# Patient Record
Sex: Female | Born: 1945 | Race: Black or African American | Hispanic: No | Marital: Married | Smoking: Never smoker
Health system: Southern US, Community
[De-identification: ages and names within clinical notes are randomized; demographics above are authoritative.]

## PROBLEM LIST (undated history)

## (undated) DIAGNOSIS — E7800 Pure hypercholesterolemia, unspecified: Secondary | ICD-10-CM

## (undated) DIAGNOSIS — N183 Chronic kidney disease, stage 3 unspecified: Secondary | ICD-10-CM

## (undated) DIAGNOSIS — I10 Essential (primary) hypertension: Secondary | ICD-10-CM

## (undated) DIAGNOSIS — E119 Type 2 diabetes mellitus without complications: Secondary | ICD-10-CM

## (undated) DIAGNOSIS — E049 Nontoxic goiter, unspecified: Secondary | ICD-10-CM

## (undated) DIAGNOSIS — D649 Anemia, unspecified: Secondary | ICD-10-CM

## (undated) HISTORY — PX: ABDOMINAL HYSTERECTOMY: SHX81

## (undated) HISTORY — PX: REPLACEMENT TOTAL KNEE: SUR1224

---

## 1999-05-31 ENCOUNTER — Other Ambulatory Visit: Admission: RE | Admit: 1999-05-31 | Discharge: 1999-06-22 | Payer: Self-pay | Admitting: Obstetrics and Gynecology

## 2002-09-05 ENCOUNTER — Emergency Department (HOSPITAL_COMMUNITY): Admission: EM | Admit: 2002-09-05 | Discharge: 2002-09-06 | Payer: Self-pay | Admitting: Emergency Medicine

## 2002-09-05 ENCOUNTER — Encounter: Payer: Self-pay | Admitting: Emergency Medicine

## 2002-09-08 ENCOUNTER — Emergency Department (HOSPITAL_COMMUNITY): Admission: EM | Admit: 2002-09-08 | Discharge: 2002-09-08 | Payer: Self-pay | Admitting: Emergency Medicine

## 2003-08-24 ENCOUNTER — Encounter: Admission: RE | Admit: 2003-08-24 | Discharge: 2003-09-10 | Payer: Self-pay | Admitting: Orthopedic Surgery

## 2003-09-11 ENCOUNTER — Encounter: Admission: RE | Admit: 2003-09-11 | Discharge: 2003-12-10 | Payer: Self-pay | Admitting: Orthopedic Surgery

## 2005-11-29 ENCOUNTER — Ambulatory Visit: Payer: Self-pay

## 2005-11-29 ENCOUNTER — Ambulatory Visit: Payer: Self-pay | Admitting: Cardiology

## 2005-11-30 ENCOUNTER — Ambulatory Visit: Payer: Self-pay | Admitting: Cardiology

## 2005-11-30 ENCOUNTER — Ambulatory Visit (HOSPITAL_COMMUNITY): Admission: RE | Admit: 2005-11-30 | Discharge: 2005-11-30 | Payer: Self-pay | Admitting: Cardiology

## 2005-12-03 ENCOUNTER — Ambulatory Visit: Payer: Self-pay

## 2005-12-03 ENCOUNTER — Encounter: Payer: Self-pay | Admitting: Cardiology

## 2005-12-17 ENCOUNTER — Ambulatory Visit: Payer: Self-pay | Admitting: Cardiology

## 2006-04-14 ENCOUNTER — Emergency Department (HOSPITAL_COMMUNITY): Admission: EM | Admit: 2006-04-14 | Discharge: 2006-04-14 | Payer: Self-pay | Admitting: Emergency Medicine

## 2006-05-07 ENCOUNTER — Ambulatory Visit: Payer: Self-pay | Admitting: Gastroenterology

## 2006-05-14 ENCOUNTER — Ambulatory Visit: Payer: Self-pay | Admitting: Gastroenterology

## 2006-10-23 ENCOUNTER — Emergency Department (HOSPITAL_COMMUNITY): Admission: EM | Admit: 2006-10-23 | Discharge: 2006-10-23 | Payer: Self-pay | Admitting: *Deleted

## 2006-12-16 ENCOUNTER — Encounter: Admission: RE | Admit: 2006-12-16 | Discharge: 2007-03-16 | Payer: Self-pay | Admitting: Family Medicine

## 2007-03-31 ENCOUNTER — Encounter: Admission: RE | Admit: 2007-03-31 | Discharge: 2007-03-31 | Payer: Self-pay | Admitting: Family Medicine

## 2007-11-18 ENCOUNTER — Emergency Department (HOSPITAL_COMMUNITY): Admission: EM | Admit: 2007-11-18 | Discharge: 2007-11-18 | Payer: Self-pay | Admitting: Emergency Medicine

## 2008-01-26 ENCOUNTER — Ambulatory Visit: Payer: Self-pay | Admitting: Cardiology

## 2008-08-24 ENCOUNTER — Emergency Department (HOSPITAL_COMMUNITY): Admission: EM | Admit: 2008-08-24 | Discharge: 2008-08-24 | Payer: Self-pay | Admitting: Emergency Medicine

## 2011-01-23 NOTE — Assessment & Plan Note (Signed)
[facility]                            CARDIOLOGY OFFICE NOTE   NAME:[name], [name] [name]                       MRN:          [id number]  DATE:01/26/2008                            DOB:          [birth year]    PRIMARY CARE PHYSICIAN:  Dr. Thayer Ohm Guest at [facility].   REASON FOR VISIT:  Recent episode of chest pain.   HISTORY OF PRESENT ILLNESS:  I last saw [name] [name] back in 2007.  [name] [name] has  a history of mild coronary atherosclerosis documented at cardiac  catheterization in March 2007 with a normal left ventricular ejection  fraction of 55-60% and grade 1 diastolic dysfunction.  Medical therapy  was recommended at that time.  [name] [name] is referred back given an episode of  chest pain [name] [name] experienced a few weeks ago.  [name] [name] tells me that [name] [name] drank  an energy drink and subsequently began to develop discomfort in [name] [name]  arms, [name] [name] neck and [name] [name] chest.  [name] [name] has had these symptoms sporadically  at baseline, but had a more intense episode on this particular occasion.  [name] [name] reports being back to baseline since then.  I have a copy of an  electrocardiogram from [name] [name] visit with [name] [name] primary care Mickell Birdwell that  shows nonspecific ST changes.  Repeat tracing today shows sinus rhythm  with nonspecific ST-T wave changes.  In addition to this, [name] [name] states [name] [name]  has also had a fairly dry but nagging cough at least since March.  There  is a possibility that it may have been related to allergies/pollen  although [name] [name] is also concerned about [name] [name] medications.  [name] [name] states [name] [name]  actually came off of all [name] [name] medicines for a week and that [name] [name] symptoms  went away.  In reviewing [name] [name] list it seems the most likely culprit could  be lisinopril, although [name] [name] has been on this for least a year.  I asked  [name] [name] to discuss possibly switching this to an angiotensin receptor  blocker to see if [name] [name] symptoms abate.   ALLERGIES:  No known drug allergies.   MEDICATIONS:  1.  Hydrochlorothiazide 25 mg p.o. daily.  2. Aspirin 325 mg p.o. daily.  3. Multivitamin one p.o. daily.  4. Vytorin 10/20 mg p.o. at bedtime.  5. Glipizide 10 mg p.o. daily.  6. Metformin 5 mg p.o. b.i.d.  7. Lisinopril 5 mg p.o. daily.  8. Claritin.  9. Proair p.r.n.   REVIEW OF SYSTEMS:  As outlined above.  No reproducible exertional chest  pain or limiting dyspnea.  No palpitations or syncope.  Otherwise  negative.   PHYSICAL EXAMINATION:  VITAL SIGNS:  Blood pressure 122/86, heart rate  is 82, weight is 205 pounds.  GENERAL:  An overweight woman in no acute distress.  NECK:  No elevated jugular venous pressure, no loud bruits.  LUNGS:  Clear.  CARDIAC:  Regular rate and rhythm.  No murmur, rub, or gallops.  EXTREMITIES:  No pitting edema.   IMPRESSION/RECOMMENDATIONS:  1. Episode of chest pain, arm pain and neck pain following an energy  drink.  [name] [name] does have these symptoms sporadically at baseline,      although not specifically related to exertion and on a baseline      history of non-obstructive coronary atherosclerosis at      catheterization in 2007.  Electrocardiogram is nonspecific.  I      recommended that [name] [name] not use any energy drinks and that [name] [name]      continue a strategy of medical therapy for [name] [name] coronary      atherosclerosis.  I doubt that additional testing is required at      this point unless [name] [name] manifests progressive symptoms.  [name] [name] will      continue to follow up with Dr. Perrin Maltese.  2. Regarding [name] [name] cough. I think it would be reasonable to at least      consider switching [name] [name] lisinopril to an angiotensin receptor      blocker or other antihypertensive to see if [name] [name] symptoms improve.      Of [name] [name] medications, this would be the most likely culprit although      I realize that [name] [name] has been on this for at least a year, predating      [name] [name] present cough.  I asked [name] [name] to discuss this with Dr. Perrin Maltese and      not simply stop the medication without appropriate  follow-up.     Jonelle Sidle, MD  Electronically Signed    SGM/MedQ  DD: 01/26/2008  DT: 01/26/2008  Job #: [id number]   cc:   Jonita Albee, M.D.

## 2011-01-26 NOTE — Cardiovascular Report (Signed)
NAMEMARELI, ANTUNES                ACCOUNT NO.:  [id number]   MEDICAL RECORD NO.:  [id number]          PATIENT TYPE:  OIB   LOCATION:  2853                         FACILITY:  [facility]   PHYSICIAN:  Arturo Morton. Riley Kill, M.D. [facility] OF BIRTH:  1945-09-16   DATE OF PROCEDURE:  11/30/2005  DATE OF DISCHARGE:                              CARDIAC CATHETERIZATION   INDICATIONS:  Ms. Alkhatib is a 65 year old who presents with some discomfort.  It has been somewhat atypical.  She has also had palpitations. She had an  adenosine Myoview in the office and developed some heart block; in addition,  there was a mild defect in the anterior segment.  Because of multiple risk  factors, Dr. Diona Browner felt that coronary arteriography was indicated.  She  was brought back to the catheterization laboratory for further evaluation.   PROCEDURES:  1.  Left heart catheterization.  2.  Selective coronary arteriography.  3.  Selective left ventriculography.   DESCRIPTION OF PROCEDURE:  The patient was brought to the catheterization  laboratory, prepped and draped in the usual fashion.  Through an anterior  puncture, the right femoral artery was easily entered. A 5-French sheath was  placed.  Views of the left and right coronary arteries were obtained in  multiple angiographic projections.  We performed a right coronary  arteriography before and after intracoronary nitroglycerin administration.  Central aortic and left ventricular pressures were measured with a pigtail  catheter.  Ventriculography was performed in the RAO projection.  All  catheters were then subsequently removed.  She was taken to the holding area  in satisfactory clinical condition for sheath removal and direct hemostasis.   There were no complications with the procedure. Her husband was not  available at the time as he gone home to take care of three grandchildren.   HEMODYNAMIC DATA:  1.  Central aortic pressure 140/90, mean 115.  2.  Left  ventricular pressure 140/4.  3.  No gradient on pullback across the aortic valve.   ANGIOGRAPHIC DATA:  1.  Ventriculography was done in the RAO projection.  Overall systolic      function was well-preserved.  No segmental abnormalities or contraction      were identified.  2.  The right coronary artery demonstrates some very mild ostial narrowing.      This probably is about 30-40%.  This was not dramatically changed by      intracoronary nitroglycerin.  There was not damping with complete      engagement with the 5-French catheter.  There was a posterior descending      artery and posterolateral branch both of which are free of critical      disease.  3.  The left main coronary artery is very large-caliber vessel that appears      free of critical disease.  4.  Left anterior descending artery courses to the apex.  The LAD has an      upward takeoff and then an abrupt turn and there is perhaps 20%      narrowing in this location.  This does not  appear to be high-grade. The      mid and distal LAD appear relatively smooth with only minor luminal      irregularities. There may be 20-30% narrowing in the midportion of the      vessel after the diagonal. Again none of this appears to be high-grade.  5.  There was a small intermediate vessel that bifurcates and is free of      critical disease.  6.  There is a circumflex provides two major marginal branches.  The      circumflex appears without significant focal narrowing.  There is some      tortuosity distally.   CONCLUSION:  1.  Well-preserved left ventricular function.  2.  Mild proximal irregularity of the left anterior descending artery.  3.  30-40% ostial narrowing of the right coronary artery without high-grade      focal stenosis.   DISPOSITION:  We will arrange for a follow-up echocardiography with Dr.  Diona Browner.  Follow-up office visit next week will be recommended.  Dr.  Diona Browner has reviewed the films, spoken with the  patient and will direct  further evaluation.      Arturo Morton. Riley Kill, M.D. [facility]  Electronically Signed     TDS/MEDQ  D:  11/30/2005  T:  12/02/2005  Job:  [id number]   cc:   Jonita Albee, M.D.  Fax: [phone]   Jonelle Sidle, M.D. [facility]  [address]  [place]  [place] [address]   CV Laboratory   Patient's medical record

## 2011-03-26 ENCOUNTER — Encounter (HOSPITAL_COMMUNITY)
Admission: RE | Admit: 2011-03-26 | Discharge: 2011-03-26 | Disposition: A | Discharge status: Home / Self Care | Payer: Medicare Other | Source: Ambulatory Visit | Attending: Orthopedic Surgery | Admitting: Orthopedic Surgery

## 2011-03-26 ENCOUNTER — Other Ambulatory Visit (HOSPITAL_COMMUNITY): Payer: Self-pay | Admitting: Orthopedic Surgery

## 2011-03-26 ENCOUNTER — Ambulatory Visit (HOSPITAL_COMMUNITY)
Admission: RE | Admit: 2011-03-26 | Discharge: 2011-03-26 | Disposition: A | Discharge status: Home / Self Care | Payer: Medicare Other | Source: Ambulatory Visit | Attending: Orthopedic Surgery | Admitting: Orthopedic Surgery

## 2011-03-26 DIAGNOSIS — Z01818 Encounter for other preprocedural examination: Secondary | ICD-10-CM

## 2011-03-26 DIAGNOSIS — Z01812 Encounter for preprocedural laboratory examination: Secondary | ICD-10-CM | POA: Insufficient documentation

## 2011-03-26 LAB — DIFFERENTIAL
Basophils Absolute: 0 10*3/uL (ref 0.0–0.1)
Basophils Relative: 0 % (ref 0–1)
Eosinophils Absolute: 0.1 10*3/uL (ref 0.0–0.7)
Eosinophils Relative: 2 % (ref 0–5)
Lymphocytes Relative: 37 % (ref 12–46)
Lymphs Abs: 2.1 10*3/uL (ref 0.7–4.0)
Monocytes Absolute: 0.4 10*3/uL (ref 0.1–1.0)
Monocytes Relative: 7 % (ref 3–12)
Neutro Abs: 3.1 10*3/uL (ref 1.7–7.7)
Neutrophils Relative %: 54 % (ref 43–77)

## 2011-03-26 LAB — CBC
HCT: 39.2 % (ref 36.0–46.0)
Hemoglobin: 12.9 g/dL (ref 12.0–15.0)
MCH: 29.1 pg (ref 26.0–34.0)
MCHC: 32.9 g/dL (ref 30.0–36.0)
MCV: 88.5 fL (ref 78.0–100.0)
Platelets: 227 10*3/uL (ref 150–400)
RBC: 4.43 MIL/uL (ref 3.87–5.11)
RDW: 13.1 % (ref 11.5–15.5)
WBC: 5.7 10*3/uL (ref 4.0–10.5)

## 2011-03-26 LAB — URINALYSIS, ROUTINE W REFLEX MICROSCOPIC
Bilirubin Urine: NEGATIVE
Glucose, UA: NEGATIVE mg/dL
Ketones, ur: NEGATIVE mg/dL
Leukocytes, UA: NEGATIVE
Nitrite: NEGATIVE
Protein, ur: NEGATIVE mg/dL
Specific Gravity, Urine: 1.029 (ref 1.005–1.030)
Urobilinogen, UA: 0.2 mg/dL (ref 0.0–1.0)
pH: 5 (ref 5.0–8.0)

## 2011-03-26 LAB — BASIC METABOLIC PANEL
BUN: 21 mg/dL (ref 6–23)
CO2: 29 mEq/L (ref 19–32)
Calcium: 11.1 mg/dL — ABNORMAL HIGH (ref 8.4–10.5)
Chloride: 106 mEq/L (ref 96–112)
Creatinine, Ser: 0.92 mg/dL (ref 0.50–1.10)
GFR calc Af Amer: >60 mL/min (ref >60)
GFR calc non Af Amer: >60 mL/min (ref >60)
Glucose, Bld: 94 mg/dL (ref 70–99)
Potassium: 4.3 mEq/L (ref 3.5–5.1)
Sodium: 142 mEq/L (ref 135–145)

## 2011-03-26 LAB — URINE MICROSCOPIC-ADD ON

## 2011-03-26 LAB — ABO/RH: ABO/RH(D): O POS

## 2011-03-26 LAB — PROTIME-INR
INR: 0.91 (ref 0.00–1.49)
Prothrombin Time: 12.5 seconds (ref 11.6–15.2)

## 2011-03-26 LAB — SURGICAL PCR SCREEN
MRSA, PCR: NEGATIVE
Staphylococcus aureus: NEGATIVE

## 2011-03-26 LAB — TYPE AND SCREEN
ABO/RH(D): O POS
Antibody Screen: NEGATIVE

## 2011-03-26 LAB — APTT: aPTT: 27 seconds (ref 24–37)

## 2011-03-27 LAB — URINE CULTURE
Colony Count: 25000
Culture  Setup Time: 201207161648

## 2011-03-30 ENCOUNTER — Inpatient Hospital Stay (HOSPITAL_COMMUNITY): Payer: Medicare Other

## 2011-03-30 ENCOUNTER — Inpatient Hospital Stay (HOSPITAL_COMMUNITY)
Admission: RE | Admit: 2011-03-30 | Discharge: 2011-04-02 | DRG: 470 | Disposition: A | Discharge status: Home Health | Payer: Medicare Other | Source: Ambulatory Visit | Attending: Orthopedic Surgery | Admitting: Orthopedic Surgery

## 2011-03-30 DIAGNOSIS — K5900 Constipation, unspecified: Secondary | ICD-10-CM | POA: Diagnosis not present

## 2011-03-30 DIAGNOSIS — M1710 Unilateral primary osteoarthritis, unspecified knee: Principal | ICD-10-CM | POA: Diagnosis present

## 2011-03-30 DIAGNOSIS — E871 Hypo-osmolality and hyponatremia: Secondary | ICD-10-CM | POA: Diagnosis not present

## 2011-03-30 DIAGNOSIS — D62 Acute posthemorrhagic anemia: Secondary | ICD-10-CM | POA: Diagnosis not present

## 2011-03-30 DIAGNOSIS — I2510 Atherosclerotic heart disease of native coronary artery without angina pectoris: Secondary | ICD-10-CM | POA: Diagnosis present

## 2011-03-30 DIAGNOSIS — I10 Essential (primary) hypertension: Secondary | ICD-10-CM | POA: Diagnosis present

## 2011-03-30 DIAGNOSIS — E119 Type 2 diabetes mellitus without complications: Secondary | ICD-10-CM | POA: Diagnosis present

## 2011-03-30 DIAGNOSIS — Z01812 Encounter for preprocedural laboratory examination: Secondary | ICD-10-CM

## 2011-03-30 DIAGNOSIS — E876 Hypokalemia: Secondary | ICD-10-CM | POA: Diagnosis not present

## 2011-03-30 DIAGNOSIS — Z794 Long term (current) use of insulin: Secondary | ICD-10-CM

## 2011-03-30 DIAGNOSIS — Z79899 Other long term (current) drug therapy: Secondary | ICD-10-CM

## 2011-03-30 DIAGNOSIS — E669 Obesity, unspecified: Secondary | ICD-10-CM | POA: Diagnosis present

## 2011-03-30 DIAGNOSIS — Z01810 Encounter for preprocedural cardiovascular examination: Secondary | ICD-10-CM

## 2011-03-30 LAB — GLUCOSE, CAPILLARY
Glucose-Capillary: 91 mg/dL (ref 70–99)
Glucose-Capillary: 129 mg/dL — ABNORMAL HIGH (ref 70–99)
Glucose-Capillary: 176 mg/dL — ABNORMAL HIGH (ref 70–99)
Glucose-Capillary: 169 mg/dL — ABNORMAL HIGH (ref 70–99)
Glucose-Capillary: 138 mg/dL — ABNORMAL HIGH (ref 70–99)

## 2011-03-31 LAB — BASIC METABOLIC PANEL
BUN: 15 mg/dL (ref 6–23)
CO2: 26 mEq/L (ref 19–32)
Calcium: 9.7 mg/dL (ref 8.4–10.5)
Chloride: 102 mEq/L (ref 96–112)
Creatinine, Ser: 0.9 mg/dL (ref 0.50–1.10)
GFR calc Af Amer: >60 mL/min (ref >60)
GFR calc non Af Amer: >60 mL/min (ref >60)
Glucose, Bld: 187 mg/dL — ABNORMAL HIGH (ref 70–99)
Potassium: 3.8 mEq/L (ref 3.5–5.1)
Sodium: 136 mEq/L (ref 135–145)

## 2011-03-31 LAB — GLUCOSE, CAPILLARY
Glucose-Capillary: 205 mg/dL — ABNORMAL HIGH (ref 70–99)
Glucose-Capillary: 190 mg/dL — ABNORMAL HIGH (ref 70–99)
Glucose-Capillary: 191 mg/dL — ABNORMAL HIGH (ref 70–99)
Glucose-Capillary: 177 mg/dL — ABNORMAL HIGH (ref 70–99)

## 2011-03-31 LAB — CBC
HCT: 29.9 % — ABNORMAL LOW (ref 36.0–46.0)
Hemoglobin: 9.9 g/dL — ABNORMAL LOW (ref 12.0–15.0)
MCH: 29 pg (ref 26.0–34.0)
MCHC: 33.1 g/dL (ref 30.0–36.0)
MCV: 87.7 fL (ref 78.0–100.0)
Platelets: 191 10*3/uL (ref 150–400)
RBC: 3.41 MIL/uL — ABNORMAL LOW (ref 3.87–5.11)
RDW: 13.5 % (ref 11.5–15.5)
WBC: 7.1 10*3/uL (ref 4.0–10.5)

## 2011-04-01 LAB — CBC
HCT: 28 % — ABNORMAL LOW (ref 36.0–46.0)
Hemoglobin: 9.4 g/dL — ABNORMAL LOW (ref 12.0–15.0)
MCH: 28.9 pg (ref 26.0–34.0)
MCHC: 33.6 g/dL (ref 30.0–36.0)
MCV: 86.2 fL (ref 78.0–100.0)
Platelets: 178 10*3/uL (ref 150–400)
RBC: 3.25 MIL/uL — ABNORMAL LOW (ref 3.87–5.11)
RDW: 12.9 % (ref 11.5–15.5)
WBC: 8.6 10*3/uL (ref 4.0–10.5)

## 2011-04-01 LAB — BASIC METABOLIC PANEL
BUN: 12 mg/dL (ref 6–23)
CO2: 24 mEq/L (ref 19–32)
Calcium: 10.3 mg/dL (ref 8.4–10.5)
Chloride: 96 mEq/L (ref 96–112)
Creatinine, Ser: 0.86 mg/dL (ref 0.50–1.10)
GFR calc Af Amer: >60 mL/min (ref >60)
GFR calc non Af Amer: >60 mL/min (ref >60)
Glucose, Bld: 156 mg/dL — ABNORMAL HIGH (ref 70–99)
Potassium: 3.4 mEq/L — ABNORMAL LOW (ref 3.5–5.1)
Sodium: 131 mEq/L — ABNORMAL LOW (ref 135–145)

## 2011-04-01 LAB — GLUCOSE, CAPILLARY
Glucose-Capillary: 168 mg/dL — ABNORMAL HIGH (ref 70–99)
Glucose-Capillary: 160 mg/dL — ABNORMAL HIGH (ref 70–99)
Glucose-Capillary: 134 mg/dL — ABNORMAL HIGH (ref 70–99)
Glucose-Capillary: 104 mg/dL — ABNORMAL HIGH (ref 70–99)

## 2011-04-02 LAB — CBC
HCT: 26.2 % — ABNORMAL LOW (ref 36.0–46.0)
Hemoglobin: 8.7 g/dL — ABNORMAL LOW (ref 12.0–15.0)
MCH: 28.7 pg (ref 26.0–34.0)
MCHC: 33.2 g/dL (ref 30.0–36.0)
MCV: 86.5 fL (ref 78.0–100.0)
Platelets: 181 10*3/uL (ref 150–400)
RBC: 3.03 MIL/uL — ABNORMAL LOW (ref 3.87–5.11)
RDW: 13.2 % (ref 11.5–15.5)
WBC: 7.4 10*3/uL (ref 4.0–10.5)

## 2011-04-02 LAB — BASIC METABOLIC PANEL
BUN: 13 mg/dL (ref 6–23)
CO2: 27 mEq/L (ref 19–32)
Calcium: 10.3 mg/dL (ref 8.4–10.5)
Chloride: 97 mEq/L (ref 96–112)
Creatinine, Ser: 0.75 mg/dL (ref 0.50–1.10)
GFR calc Af Amer: >60 mL/min (ref >60)
GFR calc non Af Amer: >60 mL/min (ref >60)
Glucose, Bld: 117 mg/dL — ABNORMAL HIGH (ref 70–99)
Potassium: 3.5 mEq/L (ref 3.5–5.1)
Sodium: 133 mEq/L — ABNORMAL LOW (ref 135–145)

## 2011-04-02 LAB — GLUCOSE, CAPILLARY
Glucose-Capillary: 129 mg/dL — ABNORMAL HIGH (ref 70–99)
Glucose-Capillary: 151 mg/dL — ABNORMAL HIGH (ref 70–99)

## 2011-04-03 NOTE — Op Note (Signed)
[name] Le, [name] Le NO.:  [id number]  MEDICAL RECORD NO.:  [id number]  LOCATION:  5028                         FACILITY:  [facility]  PHYSICIAN:  Dyke Brackett, M.D.    DATE OF BIRTH:  [birth year]  DATE OF PROCEDURE: DATE OF DISCHARGE:                              OPERATIVE REPORT   INDICATIONS:  This is a 65 year old female, intractable knee pain, right knee severe, varus arthrosis thought to be amenable to total knee replacement.  PREOPERATIVE DIAGNOSIS:  Osteoarthritis, right knee.  POSTOPERATIVE DIAGNOSIS:  Osteoarthritis, right knee.  OPERATION:  Right total knee replacement with repair partial patellar tendon tear using Sigma size 2.5 femur tibia, 12.5 bearing, 35 mm 3 peg all poly patella.  SURGEON:  Dyke Brackett, MD  ASSISTANT:  Su Hilt.  TOURNIQUET TIME:  1 hour 20 minutes.  DESCRIPTION OF PROCEDURE:  Sterile prep and drape, exsanguination of legs, placed in 350, medial parapatellar approach to the knee made through a straight skin incision.  We cut distal femur 10 mm with 5- degree valgus inclination and at that point referencing an external guide with the appropriate degree of valgus resected 10 mm below the least diseased lateral compartment with a created extension gap at 12.5 mm into match that 12.5 mm flexion.  Attention was next directed back at the femur.  We sized the femur provisionally for 2.5 followed by placement of the provisional cutting block to set the rotation on the femur and then we replaced the rotation block with the final 5 and 1 cutting block.  We cut anterior-posterior femur chamfer cuts and then checked the flexion gap to equal the extension gap at 12.5 mm.  Excess menisci were removed from the knee.  We also did a posterior cleanup, cut with an osteotome of the posterior femur.  Attention was next directed to the tibia, keel hole was cut for the tibia and then we placed the trial tibia with a 12.5-mm bearing,  placed the trial femur prior to that though we cut a box cut for the sigma knee without difficulty with slight lateralization of the prosthesis for optimal patellar tracking, cut the patella leaving about 12-13 mm of native patella with a 3 peg all patellar trial placed.  All trials were placed.  We had 0 degrees of extension, 120 flexion, good stability with no excessive tightness, good balancing of the flexion/extension gap noted.  No varus or valgus instability.  All trials were removed.  Bony surfaces were irrigated.  We then inserted the final prosthesis with the exception of the bearing.  Trial bearing was placed with final components.  Cement was allowed to harden. Bearing was removed.  Back of the knee was checked for cement. We irrigated, then we released the tourniquet.  No excessive bleeding was noted to the posterior aspect of the knee.  Final bearing was placed. Hemovac drain was placed superolaterally.  We did have actually partial detachment patellar tendon which we fixed with a Mitek anchor as well.  Hemovac drain as mentioned placed superolaterally.  Closure was packed with #1 Ethibond, 2-0 Vicryl, skin clips, Marcaine entering into the capsule and the skin. Taken to the recovery  room in stable condition.     Dyke Brackett, M.D.     WDC/MEDQ  D:  03/30/2011  T:  03/30/2011  Job:  [id number]  Electronically Signed by W. Askari Kinley M.D. on 04/03/2011 08:22:37 AM

## 2011-06-04 LAB — URINE MICROSCOPIC-ADD ON

## 2011-06-04 LAB — URINALYSIS, ROUTINE W REFLEX MICROSCOPIC
Bilirubin Urine: NEGATIVE
Glucose, UA: >1000 — AB
Ketones, ur: NEGATIVE
Leukocytes, UA: NEGATIVE
Nitrite: NEGATIVE
Protein, ur: NEGATIVE
Specific Gravity, Urine: 1.046 — ABNORMAL HIGH
Urobilinogen, UA: 0.2
pH: 5.5

## 2011-06-04 LAB — BASIC METABOLIC PANEL
BUN: 22
CO2: 27
Calcium: 10.4
Chloride: 104
Creatinine, Ser: 1.12
GFR calc Af Amer: 60 — ABNORMAL LOW
GFR calc non Af Amer: 49 — ABNORMAL LOW
Glucose, Bld: 346 — ABNORMAL HIGH
Potassium: 4.3
Sodium: 136

## 2011-06-14 LAB — URINALYSIS, ROUTINE W REFLEX MICROSCOPIC
Bilirubin Urine: NEGATIVE
Glucose, UA: >1000 mg/dL — AB
Ketones, ur: NEGATIVE mg/dL
Leukocytes, UA: NEGATIVE
Nitrite: NEGATIVE
Protein, ur: NEGATIVE mg/dL
Specific Gravity, Urine: 1.022 (ref 1.005–1.030)
Urobilinogen, UA: 0.2 mg/dL (ref 0.0–1.0)
pH: 5.5 (ref 5.0–8.0)

## 2011-06-14 LAB — DIFFERENTIAL
Basophils Absolute: 0 10*3/uL (ref 0.0–0.1)
Basophils Relative: 0 % (ref 0–1)
Eosinophils Absolute: 0.1 10*3/uL (ref 0.0–0.7)
Eosinophils Relative: 2 % (ref 0–5)
Lymphocytes Relative: 33 % (ref 12–46)
Lymphs Abs: 1.6 10*3/uL (ref 0.7–4.0)
Monocytes Absolute: 0.4 10*3/uL (ref 0.1–1.0)
Monocytes Relative: 9 % (ref 3–12)
Neutro Abs: 2.7 10*3/uL (ref 1.7–7.7)
Neutrophils Relative %: 57 % (ref 43–77)

## 2011-06-14 LAB — GLUCOSE, CAPILLARY
Glucose-Capillary: 163 mg/dL — ABNORMAL HIGH (ref 70–99)
Glucose-Capillary: 290 mg/dL — ABNORMAL HIGH (ref 70–99)

## 2011-06-14 LAB — CBC
HCT: 39.4 % (ref 36.0–46.0)
Hemoglobin: 12.9 g/dL (ref 12.0–15.0)
MCHC: 32.7 g/dL (ref 30.0–36.0)
MCV: 88.8 fL (ref 78.0–100.0)
Platelets: 217 10*3/uL (ref 150–400)
RBC: 4.43 MIL/uL (ref 3.87–5.11)
RDW: 13.7 % (ref 11.5–15.5)
WBC: 4.8 10*3/uL (ref 4.0–10.5)

## 2011-06-14 LAB — URINE CULTURE: Colony Count: >=100000

## 2011-06-14 LAB — BASIC METABOLIC PANEL
BUN: 17 mg/dL (ref 6–23)
CO2: 25 mEq/L (ref 19–32)
Calcium: 10.4 mg/dL (ref 8.4–10.5)
Chloride: 103 mEq/L (ref 96–112)
Creatinine, Ser: 0.95 mg/dL (ref 0.4–1.2)
GFR calc Af Amer: >60 mL/min (ref >60)
GFR calc non Af Amer: 59 mL/min — ABNORMAL LOW (ref >60)
Glucose, Bld: 319 mg/dL — ABNORMAL HIGH (ref 70–99)
Potassium: 4.1 mEq/L (ref 3.5–5.1)
Sodium: 135 mEq/L (ref 135–145)

## 2011-06-14 LAB — URINE MICROSCOPIC-ADD ON

## 2012-08-18 ENCOUNTER — Other Ambulatory Visit (HOSPITAL_COMMUNITY): Payer: Self-pay | Admitting: Orthopedic Surgery

## 2012-08-18 DIAGNOSIS — M25561 Pain in right knee: Secondary | ICD-10-CM

## 2012-08-26 ENCOUNTER — Encounter (HOSPITAL_COMMUNITY): Payer: Medicare Other

## 2012-09-16 ENCOUNTER — Encounter (HOSPITAL_COMMUNITY): Payer: Medicare Other

## 2012-10-21 ENCOUNTER — Other Ambulatory Visit: Payer: Self-pay | Admitting: Family Medicine

## 2012-10-21 DIAGNOSIS — E049 Nontoxic goiter, unspecified: Secondary | ICD-10-CM

## 2012-10-28 ENCOUNTER — Other Ambulatory Visit: Payer: Medicare Other

## 2012-10-30 ENCOUNTER — Other Ambulatory Visit: Payer: Medicare Other

## 2012-11-04 ENCOUNTER — Other Ambulatory Visit: Payer: Medicare Other

## 2012-11-06 ENCOUNTER — Ambulatory Visit
Admission: RE | Admit: 2012-11-06 | Discharge: 2012-11-06 | Disposition: A | Discharge status: Home / Self Care | Payer: Medicare Other | Source: Ambulatory Visit | Attending: Family Medicine | Admitting: Family Medicine

## 2012-11-06 DIAGNOSIS — E049 Nontoxic goiter, unspecified: Secondary | ICD-10-CM

## 2012-12-01 ENCOUNTER — Encounter (HOSPITAL_COMMUNITY): Payer: Self-pay | Admitting: Emergency Medicine

## 2012-12-01 ENCOUNTER — Emergency Department (HOSPITAL_COMMUNITY)
Admission: EM | Admit: 2012-12-01 | Discharge: 2012-12-01 | Disposition: A | Discharge status: Home / Self Care | Payer: PRIVATE HEALTH INSURANCE | Attending: Emergency Medicine | Admitting: Emergency Medicine

## 2012-12-01 ENCOUNTER — Emergency Department (HOSPITAL_COMMUNITY): Payer: PRIVATE HEALTH INSURANCE

## 2012-12-01 DIAGNOSIS — R0602 Shortness of breath: Secondary | ICD-10-CM | POA: Insufficient documentation

## 2012-12-01 DIAGNOSIS — Z794 Long term (current) use of insulin: Secondary | ICD-10-CM | POA: Insufficient documentation

## 2012-12-01 DIAGNOSIS — R079 Chest pain, unspecified: Secondary | ICD-10-CM

## 2012-12-01 DIAGNOSIS — M25512 Pain in left shoulder: Secondary | ICD-10-CM

## 2012-12-01 DIAGNOSIS — M25519 Pain in unspecified shoulder: Secondary | ICD-10-CM | POA: Insufficient documentation

## 2012-12-01 DIAGNOSIS — E7800 Pure hypercholesterolemia, unspecified: Secondary | ICD-10-CM | POA: Insufficient documentation

## 2012-12-01 DIAGNOSIS — I10 Essential (primary) hypertension: Secondary | ICD-10-CM | POA: Insufficient documentation

## 2012-12-01 DIAGNOSIS — E119 Type 2 diabetes mellitus without complications: Secondary | ICD-10-CM | POA: Insufficient documentation

## 2012-12-01 DIAGNOSIS — Z79899 Other long term (current) drug therapy: Secondary | ICD-10-CM | POA: Insufficient documentation

## 2012-12-01 HISTORY — DX: Essential (primary) hypertension: I10

## 2012-12-01 HISTORY — DX: Type 2 diabetes mellitus without complications: E11.9

## 2012-12-01 HISTORY — DX: Pure hypercholesterolemia, unspecified: E78.00

## 2012-12-01 LAB — CBC
HCT: 35.5 % — ABNORMAL LOW (ref 36.0–46.0)
Hemoglobin: 11.8 g/dL — ABNORMAL LOW (ref 12.0–15.0)
MCH: 28.6 pg (ref 26.0–34.0)
MCHC: 33.2 g/dL (ref 30.0–36.0)
MCV: 86.2 fL (ref 78.0–100.0)
Platelets: 247 10*3/uL (ref 150–400)
RBC: 4.12 MIL/uL (ref 3.87–5.11)
RDW: 14 % (ref 11.5–15.5)
WBC: 3.7 10*3/uL — ABNORMAL LOW (ref 4.0–10.5)

## 2012-12-01 LAB — BASIC METABOLIC PANEL
BUN: 19 mg/dL (ref 6–23)
CO2: 24 mEq/L (ref 19–32)
Calcium: 10.2 mg/dL (ref 8.4–10.5)
Chloride: 107 mEq/L (ref 96–112)
Creatinine, Ser: 0.91 mg/dL (ref 0.50–1.10)
GFR calc Af Amer: 74 mL/min — ABNORMAL LOW (ref >90)
GFR calc non Af Amer: 64 mL/min — ABNORMAL LOW (ref >90)
Glucose, Bld: 110 mg/dL — ABNORMAL HIGH (ref 70–99)
Potassium: 3.8 mEq/L (ref 3.5–5.1)
Sodium: 140 mEq/L (ref 135–145)

## 2012-12-01 LAB — TROPONIN I: Troponin I: <0.3 ng/mL (ref <0.30)

## 2012-12-01 MED ORDER — HYDROCODONE-ACETAMINOPHEN 5-325 MG PO TABS: 1.0000 | ORAL_TABLET | ORAL | Status: DC | PRN

## 2012-12-01 MED ORDER — IBUPROFEN 400 MG PO TABS: 400.0000 mg | ORAL_TABLET | Freq: Three times a day (TID) | ORAL | Status: DC | PRN

## 2012-12-01 MED ORDER — IBUPROFEN 200 MG PO TABS
600.0000 mg | ORAL_TABLET | Freq: Once | ORAL | Status: AC
  Administered 2012-12-01: 600 mg via ORAL
  Filled 2012-12-01: qty 3

## 2012-12-01 NOTE — ED Notes (Signed)
Pt c/o chest pain and arm pain all on the left side and SOB that has been constant since this past weekend. Denies dizziness, n/v, headache.

## 2012-12-01 NOTE — ED Provider Notes (Signed)
History     CSN: [id number]  Arrival date & time 12/01/12  0919   First MD Initiated Contact with Patient 12/01/12 [phone]      Chief Complaint  Patient presents with  . Shortness of Breath  . Chest Pain     The history is provided by the patient.   patient reports 3 days of constant left neck left shoulder pain radiating down her left arm.  She has had some occasional shortness of breath.  She is not had cough or leg swelling.  She denies fevers or chills.  Her discomfort is been constant.  She has no prior history of cardiac disease.  She does have diabetes, hypertension, hyperlipidemia.  She does not smoke cigarettes.  No numbness or weakness of her left upper extremity.  No recent falls or trauma.  She denies back pain.  The pain she reports also radiates towards her left lateral chest.  Again when asked specifically this pain is been constant and does not seem to wax and wane.  Her discomfort is nonexertional and go would be exacerbated by palpation and movement of her left shoulder.  Past Medical History  Diagnosis Date  . Diabetes mellitus without complication   . Hypertension   . High cholesterol     Past Surgical History  Procedure Laterality Date  . Replacement total knee Right   . Abdominal hysterectomy      No family history on file.  History  Substance Use Topics  . Smoking status: Never Smoker   . Smokeless tobacco: Not on file  . Alcohol Use: Yes     Comment: ocassionally     OB History   Grav Para Term Preterm Abortions TAB SAB Ect Mult Living                  Review of Systems  All other systems reviewed and are negative.    Allergies  Review of patient's allergies indicates no known allergies.  Home Medications   Current Outpatient Rx  Name  Route  Sig  Dispense  Refill  . insulin NPH-insulin regular (NOVOLIN 70/30) (70-30) 100 UNIT/ML injection   Subcutaneous   Inject 20-40 Units into the skin 2 (two) times daily with a meal. 40u in the am,  20u at night         . losartan-hydrochlorothiazide (HYZAAR) 100-25 MG per tablet   Oral   Take 1 tablet by mouth daily.         . metFORMIN (GLUCOPHAGE) 1000 MG tablet   Oral   Take 1,000 mg by mouth daily with breakfast.         . pravastatin (PRAVACHOL) 80 MG tablet   Oral   Take 80 mg by mouth daily.           BP 174/83  Pulse 72  Temp(Src) 98.3 F (36.8 C) (Oral)  Resp 20  SpO2 100%  Physical Exam  Nursing note and vitals reviewed. Constitutional: She is oriented to person, place, and time. She appears well-developed and well-nourished. No distress.  HENT:  Head: Normocephalic and atraumatic.  Eyes: EOM are normal.  Neck: Normal range of motion.  Cardiovascular: Normal rate, regular rhythm and normal heart sounds.   Pulmonary/Chest: Effort normal and breath sounds normal.  Abdominal: Soft. She exhibits no distension. There is no tenderness.  Musculoskeletal:  Mild pain with range of motion of left shoulder without obvious deformity of left shoulder.  Normal left radial pulse.  Normal grip strength  in her left hand.  Mild tenderness along her left trapezius muscle without spasm.  No rash noted in the dermatomal distribution of her left shoulder  Neurological: She is alert and oriented to person, place, and time.  Skin: Skin is warm and dry.  Psychiatric: She has a normal mood and affect. Judgment normal.    ED Course  Procedures (including critical care time)   Date: 12/01/2012  Rate: 74  Rhythm: normal sinus rhythm  QRS Axis: normal  Intervals: normal  ST/T Wave abnormalities: normal  Conduction Disutrbances: none  Narrative Interpretation:   Old EKG Reviewed: No significant changes noted     Labs Reviewed  CBC - Abnormal; Notable for the following:    WBC 3.7 (*)    Hemoglobin 11.8 (*)    HCT 35.5 (*)    All other components within normal limits  BASIC METABOLIC PANEL - Abnormal; Notable for the following:    Glucose, Bld 110 (*)    GFR  calc non Af Amer 64 (*)    GFR calc Af Amer 74 (*)    All other components within normal limits  TROPONIN I   Dg Chest 2 View  12/01/2012  *RADIOLOGY REPORT*  Clinical Data: Shortness of breath, left arm pain, diabetes, hypertension  CHEST - 2 VIEW  Comparison: 03/26/2011  Findings: Upper-normal size of cardiac silhouette. Rotated to the right on 1 of 2 PA views. Mediastinal contours and pulmonary vascularity normal for degree of rotation. Minimal scarring at the right costophrenic angle. Lungs otherwise clear. Central peribronchial thickening. No definite infiltrate, pleural effusion or pneumothorax.  IMPRESSION: Minimal bronchitic changes and right basilar scarring. No acute abnormalities.   Original Report Authenticated By: Ulyses Southward, M.D.    I personally reviewed the imaging tests through PACS system I reviewed available ER/hospitalization records through the EMR   1. Shoulder pain, left   2. Chest pain       MDM  The patient's pain seems to be reproducible his been constant for 3 days.  Her EKG and troponin are normal.  This seems to be more a musculoskeletal type issue.  Chest x-ray is clear.  Patient is feeling better at this time even without therapy in the emergency department.  Discharge home with PCP and orthopedic followup.  She understands return to the ER for new or worsening symptoms.  No recent trauma therefore x-ray will not be obtained at this time.  No secondary signs of infection        Lyanne Co, MD 12/01/12 1154

## 2013-05-20 ENCOUNTER — Other Ambulatory Visit: Payer: Self-pay | Admitting: Family Medicine

## 2013-05-20 DIAGNOSIS — E041 Nontoxic single thyroid nodule: Secondary | ICD-10-CM

## 2013-05-26 ENCOUNTER — Other Ambulatory Visit: Payer: Medicare Other

## 2013-10-05 ENCOUNTER — Other Ambulatory Visit: Payer: Medicare Other

## 2013-11-13 ENCOUNTER — Ambulatory Visit
Admission: RE | Admit: 2013-11-13 | Discharge: 2013-11-13 | Disposition: A | Discharge status: Home / Self Care | Payer: Medicare Other | Source: Ambulatory Visit | Attending: Family Medicine | Admitting: Family Medicine

## 2013-11-13 DIAGNOSIS — E041 Nontoxic single thyroid nodule: Secondary | ICD-10-CM

## 2013-11-18 ENCOUNTER — Other Ambulatory Visit: Payer: Self-pay | Admitting: Family Medicine

## 2013-11-18 DIAGNOSIS — E041 Nontoxic single thyroid nodule: Secondary | ICD-10-CM

## 2013-11-19 ENCOUNTER — Other Ambulatory Visit: Payer: Self-pay | Admitting: Family Medicine

## 2013-11-19 DIAGNOSIS — E041 Nontoxic single thyroid nodule: Secondary | ICD-10-CM

## 2013-11-26 ENCOUNTER — Encounter: Payer: Self-pay | Admitting: Radiology

## 2013-12-08 ENCOUNTER — Inpatient Hospital Stay
Admission: RE | Admit: 2013-12-08 | Discharge: 2013-12-08 | Disposition: A | Discharge status: Home / Self Care | Payer: Medicare Other | Source: Ambulatory Visit | Attending: Family Medicine | Admitting: Family Medicine

## 2013-12-08 ENCOUNTER — Inpatient Hospital Stay: Admission: RE | Admit: 2013-12-08 | Payer: Medicare Other | Source: Ambulatory Visit

## 2013-12-08 ENCOUNTER — Other Ambulatory Visit: Payer: Medicare Other

## 2013-12-16 ENCOUNTER — Ambulatory Visit
Admission: RE | Admit: 2013-12-16 | Discharge: 2013-12-16 | Disposition: A | Discharge status: Home / Self Care | Payer: Medicare Other | Source: Ambulatory Visit | Attending: Family Medicine | Admitting: Family Medicine

## 2013-12-16 ENCOUNTER — Other Ambulatory Visit (HOSPITAL_COMMUNITY)
Admission: RE | Admit: 2013-12-16 | Discharge: 2013-12-16 | Disposition: A | Discharge status: Home / Self Care | Payer: PRIVATE HEALTH INSURANCE | Source: Ambulatory Visit | Attending: Interventional Radiology | Admitting: Interventional Radiology

## 2013-12-16 DIAGNOSIS — E041 Nontoxic single thyroid nodule: Secondary | ICD-10-CM

## 2014-07-06 ENCOUNTER — Encounter: Payer: Self-pay | Admitting: Internal Medicine

## 2014-07-06 ENCOUNTER — Ambulatory Visit (INDEPENDENT_AMBULATORY_CARE_PROVIDER_SITE_OTHER): Payer: Medicare Other | Admitting: Internal Medicine

## 2014-07-06 VITALS — BP 142/82 | HR 85 | Ht 65.0 in | Wt 203.0 lb

## 2014-07-06 DIAGNOSIS — R053 Chronic cough: Secondary | ICD-10-CM | POA: Insufficient documentation

## 2014-07-06 DIAGNOSIS — R05 Cough: Secondary | ICD-10-CM

## 2014-07-06 DIAGNOSIS — R0600 Dyspnea, unspecified: Secondary | ICD-10-CM

## 2014-07-06 MED ORDER — FLUTICASONE PROPIONATE 50 MCG/ACT NA SUSP: 2.0000 | Freq: Every day | NASAL | Status: DC

## 2014-07-06 MED ORDER — OMEPRAZOLE-SODIUM BICARBONATE 20-1100 MG PO CAPS: 1.0000 | ORAL_CAPSULE | Freq: Every day | ORAL | Status: DC

## 2014-07-06 NOTE — Patient Instructions (Addendum)
Cough is from possible sinus drainage, possible  acid reflux,  All of this is working together to cause cyclical cough/LPR cough  #Sinus drainage  -restart nasal steroid generic fluticasone inhaler 2 squirts each nostril daily as advised    #Possible Acid Reflux - Stop goody poweder - STop fish oil  - take otc zegerid 20mg   1 capsule daily on empty stomach (nurse will send script)   - take diet sheet from [place] - avoid colas, spices, cheeses, spirits, red meats, beer, chocolates, fried foods etc.,   - sleep with head end of bed elevated  - eat small frequent meals  - do not go to bed for 3 hours after last meal  #Possible Asthma  - do methacholine challenge test (you cannot be pregnant or have heart disease for this test)   #Possible abnormal CXR   DO  High Resolution CT chest without contrast on ILD protocol.   #Cyclical cough  - please choose 2-3 days and observe complete voice rest - no talking or whispering  - at all times there  there is urge to cough, drink water or swallow or sip on throat lozenge  #Followup -Nurse Practitioner [name] Le to see you back in 3 weeks - any problems call or come sooner

## 2014-07-06 NOTE — Progress Notes (Signed)
Subjective:    Patient ID: [name] Le, female    DOB: July 23, 1946, 68 y.o.   MRN: [id number] PCP Vidal Schwalbe, MD  HPI  IOV 07/06/2014  Chief Complaint  Patient presents with  . Pulmonary Consult    Pt referred by Dr. Harlan Stains for persistant cough.     68 year old female referred for chronic cough  - Reports insidious onset of chronic cough following upper respiratory infection that she picked up from her granddaughter 4 months ago. Since then cough has persisted. It is moderate in severity but at night it can be severe in severity. Course is possibly worse since onset. Quality is dry cough. Cough is made worse by lying down at night. No specific relieving factors. Cough is associated with a funny sensation in her throat and may be wheezing at times but no dyspnea, orthopnea, paroxysmal nocturnal dyspnea. She has tried Flonase in the past for a brief period of time but did not help.cough is a laryngeal quality  - Allergy and sinus history: she denies having had allergies. Primary care physician and suggested allergies as a possible etiology but she disagrees  - Acid reflux: She denies any acid reflux history but I notice that she is on Goody powder for arthritisand she takes this every other day. Along with this she is on fish oil. Both these agents can provoke acid reflux   - pulmonary history: She denies any history of COPD or asthma. She used to smoke marijuana until the 1980s but then quit . Denies tobacco or cocaine use   Hypertension history: She is on a ARB but not on ACE inhibitor    Dr Lorenza Cambridge Reflux Symptom Index (> 13-15 suggestive of LPR cough) 07/06/2014 She did not do   Hoarseness of problem with voice   Clearing  Of Throat   Excess throat mucus or feeling of post nasal drip   Difficulty swallowing food, liquid or tablets   Cough after eating or lying down   Breathing difficulties or choking episodes   Troublesome or annoying cough   Sensation of  something sticking in throat or lump in throat   Heartburn, chest pain, indigestion, or stomach acid coming up   TOTAL        has a past medical history of Diabetes mellitus without complication; Hypertension; and High cholesterol.   has past surgical history that includes Replacement total knee (Right) and Abdominal hysterectomy.   reports that she has never smoked. She has never used smokeless tobacco.  Current outpatient prescriptions:aspirin 81 MG tablet, Take 81 mg by mouth daily., Disp: , Rfl: ;  Aspirin-Acetaminophen (GOODY BODY PAIN) 500-325 MG PACK, Take by mouth as needed., Disp: , Rfl: ;  HYDROcodone-acetaminophen (NORCO/VICODIN) 5-325 MG per tablet, Take 1 tablet by mouth every 4 (four) hours as needed for pain., Disp: 15 tablet, Rfl: 0 ibuprofen (ADVIL,MOTRIN) 400 MG tablet, Take 1 tablet (400 mg total) by mouth every 8 (eight) hours as needed for pain., Disp: 10 tablet, Rfl: 0;  insulin NPH-insulin regular (NOVOLIN 70/30) (70-30) 100 UNIT/ML injection, Inject 20-40 Units into the skin 2 (two) times daily with a meal. 40u in the am, 20u at night, Disp: , Rfl: ;  losartan-hydrochlorothiazide (HYZAAR) 100-25 MG per tablet, Take 1 tablet by mouth daily., Disp: , Rfl:  metFORMIN (GLUCOPHAGE) 1000 MG tablet, Take 1,000 mg by mouth daily with breakfast., Disp: , Rfl: ;  Omega-3 Fatty Acids (FISH OIL) 1000 MG CAPS, Take 1 capsule by mouth daily., Disp: ,  Rfl: ;  pravastatin (PRAVACHOL) 80 MG tablet, Take 80 mg by mouth daily., Disp: , Rfl: ;  Vitamin D, Ergocalciferol, (DRISDOL) 50000 UNITS CAPS capsule, Take 50,000 Units by mouth every 7 (seven) days., Disp: , Rfl:  fluticasone (FLONASE) 50 MCG/ACT nasal spray, Place 2 sprays into both nostrils daily., Disp: , Rfl:     History  Substance Use Topics  . Smoking status: Never Smoker   . Smokeless tobacco: Never Used  . Alcohol Use: Yes     Comment: ocassionally      There is no immunization history on file for this  patient.    Review of Systems  Constitutional: Negative for fever and unexpected weight change.  HENT: Negative for congestion, dental problem, ear pain, nosebleeds, postnasal drip, rhinorrhea, sinus pressure, sneezing, sore throat and trouble swallowing.   Eyes: Negative for redness and itching.  Respiratory: Positive for cough and shortness of breath. Negative for chest tightness and wheezing.   Cardiovascular: Negative for palpitations and leg swelling.  Gastrointestinal: Negative for nausea and vomiting.  Genitourinary: Negative for dysuria.  Musculoskeletal: Negative for joint swelling.  Skin: Negative for rash.  Neurological: Negative for headaches.  Hematological: Does not bruise/bleed easily.  Psychiatric/Behavioral: Negative for dysphoric mood. The patient is not nervous/anxious.        Objective:   Physical Exam  Vitals reviewed. Constitutional: She is oriented to person, place, and time. She appears well-developed and well-nourished. No distress.  Body mass index is 33.78 kg/(m^2).   HENT:  Head: Normocephalic and atraumatic.  Right Ear: External ear normal.  Left Ear: External ear normal.  Mouth/Throat: Oropharynx is clear and moist. No oropharyngeal exudate.  Hoarse voice Barking cough  Eyes: Conjunctivae and EOM are normal. Pupils are equal, round, and reactive to light. Right eye exhibits no discharge. Left eye exhibits no discharge. No scleral icterus.  Neck: Normal range of motion. Neck supple. No JVD present. No tracheal deviation present. No thyromegaly present.  Cardiovascular: Normal rate, regular rhythm, normal heart sounds and intact distal pulses.  Exam reveals no gallop and no friction rub.   No murmur heard. Pulmonary/Chest: Effort normal and breath sounds normal. No respiratory distress. She has no wheezes. She has no rales. She exhibits no tenderness.  Abdominal: Soft. Bowel sounds are normal. She exhibits no distension and no mass. There is no  tenderness. There is no rebound and no guarding.  Musculoskeletal: Normal range of motion. She exhibits no edema and no tenderness.  Lymphadenopathy:    She has no cervical adenopathy.  Neurological: She is alert and oriented to person, place, and time. She has normal reflexes. No cranial nerve deficit. She exhibits normal muscle tone. Coordination normal.  Skin: Skin is warm and dry. No rash noted. She is not diaphoretic. No erythema. No pallor.  Psychiatric: She has a normal mood and affect. Her behavior is normal. Judgment and thought content normal.    Filed Vitals:   07/06/14 1526  BP: 142/82  Pulse: 85  Height: 5\' 5"  (1.651 m)  Weight: 203 lb (92.08 kg)  SpO2: 100%         Assessment & Plan:  Cough is from possible sinus drainage, possible  acid reflux,  All of this is working together to cause cyclical cough/LPR cough  #Sinus drainage  -restart nasal steroid generic fluticasone inhaler 2 squirts each nostril daily as advised    #Possible Acid Reflux - Stop goody poweder - STop fish oil  - take otc zegerid 20mg   1 capsule daily on empty stomach (nurse will send script)   - take diet sheet from [place] - avoid colas, spices, cheeses, spirits, red meats, beer, chocolates, fried foods etc.,   - sleep with head end of bed elevated  - eat small frequent meals  - do not go to bed for 3 hours after last meal  #Possible Asthma  - do methacholine challenge test (you cannot be pregnant or have heart disease for this test)   #Possible abnormal CXR   DO  High Resolution CT chest without contrast on ILD protocol.   #Cyclical cough  - please choose 2-3 days and observe complete voice rest - no talking or whispering  - at all times there  there is urge to cough, drink water or swallow or sip on throat lozenge  #Followup -Nurse Practitioner Tammy Parrett to see you back in 3 weeks - any problems call or come sooner    Dr. Brand Males, M.D., [facility].C.P Pulmonary and Critical  Care Medicine Staff Physician [facility] Pager: [phone], If no answer or between  15:00h - 7:00h: call [phone]  07/06/2014 3:57 PM

## 2014-07-14 ENCOUNTER — Ambulatory Visit (HOSPITAL_COMMUNITY)
Admission: RE | Admit: 2014-07-14 | Discharge: 2014-07-14 | Disposition: A | Discharge status: Home / Self Care | Payer: Medicare Other | Source: Ambulatory Visit | Attending: Internal Medicine | Admitting: Internal Medicine

## 2014-07-14 ENCOUNTER — Ambulatory Visit (INDEPENDENT_AMBULATORY_CARE_PROVIDER_SITE_OTHER)
Admission: RE | Admit: 2014-07-14 | Discharge: 2014-07-14 | Disposition: A | Discharge status: Home / Self Care | Payer: Medicare Other | Source: Ambulatory Visit | Attending: Internal Medicine | Admitting: Internal Medicine

## 2014-07-14 DIAGNOSIS — R0600 Dyspnea, unspecified: Secondary | ICD-10-CM

## 2014-07-14 DIAGNOSIS — R05 Cough: Secondary | ICD-10-CM | POA: Diagnosis not present

## 2014-07-14 LAB — PULMONARY FUNCTION TEST
FEF 25-75 Post: 1.34 L/sec
FEF 25-75 Pre: 1.19 L/sec
FEF2575-%Change-Post: 12 %
FEF2575-%Pred-Post: 75 %
FEF2575-%Pred-Pre: 67 %
FEV1-%Change-Post: 2 %
FEV1-%Pred-Post: 80 %
FEV1-%Pred-Pre: 78 %
FEV1-Post: 1.56 L
FEV1-Pre: 1.52 L
FEV1FVC-%Change-Post: 0 %
FEV1FVC-%Pred-Pre: 97 %
FEV6-%Change-Post: 2 %
FEV6-%Pred-Post: 84 %
FEV6-%Pred-Pre: 82 %
FEV6-Post: 2.03 L
FEV6-Pre: 1.97 L
FEV6FVC-%Change-Post: 0 %
FEV6FVC-%Pred-Post: 102 %
FEV6FVC-%Pred-Pre: 102 %
FVC-%Change-Post: 2 %
FVC-%Pred-Post: 82 %
FVC-%Pred-Pre: 80 %
FVC-Post: 2.07 L
FVC-Pre: 2.02 L
Post FEV1/FVC ratio: 75 %
Post FEV6/FVC ratio: 98 %
Pre FEV1/FVC ratio: 75 %
Pre FEV6/FVC Ratio: 98 %

## 2014-07-14 MED ORDER — SODIUM CHLORIDE 0.9 % IN NEBU
3.0000 mL | INHALATION_SOLUTION | Freq: Once | RESPIRATORY_TRACT | Status: AC
  Administered 2014-07-14: 3 mL via RESPIRATORY_TRACT

## 2014-07-14 MED ORDER — METHACHOLINE 1 MG/ML NEB SOLN
2.0000 mL | Freq: Once | RESPIRATORY_TRACT | Status: AC
  Administered 2014-07-14: 2 mg via RESPIRATORY_TRACT

## 2014-07-14 MED ORDER — METHACHOLINE 4 MG/ML NEB SOLN: 2.0000 mL | Freq: Once | RESPIRATORY_TRACT | Status: DC

## 2014-07-14 MED ORDER — METHACHOLINE 16 MG/ML NEB SOLN: 2.0000 mL | Freq: Once | RESPIRATORY_TRACT | Status: DC

## 2014-07-14 MED ORDER — METHACHOLINE 0.0625 MG/ML NEB SOLN
2.0000 mL | Freq: Once | RESPIRATORY_TRACT | Status: AC
  Administered 2014-07-14: 0.125 mg via RESPIRATORY_TRACT

## 2014-07-14 MED ORDER — METHACHOLINE 0.25 MG/ML NEB SOLN
2.0000 mL | Freq: Once | RESPIRATORY_TRACT | Status: AC
  Administered 2014-07-14: 0.5 mg via RESPIRATORY_TRACT

## 2014-07-14 MED ORDER — ALBUTEROL SULFATE (2.5 MG/3ML) 0.083% IN NEBU
2.5000 mg | INHALATION_SOLUTION | Freq: Once | RESPIRATORY_TRACT | Status: AC
  Administered 2014-07-14: 2.5 mg via RESPIRATORY_TRACT

## 2014-07-18 ENCOUNTER — Telehealth: Payer: Self-pay | Admitting: Internal Medicine

## 2014-07-18 NOTE — Telephone Encounter (Signed)
Daneil Dan  Please tell LINDELL TUSSEY that methacholine challenge  Is positive for asthma and CT chest shows normal lung tissue. She should keep up appt with Tammy Parrett for cough  Also,  CT shows  - co artery calcification  - enlarged thyroid  Tammy PArrett can address those  Thanks  Dr. Brand Males, M.D., [facility].C.P Pulmonary and Critical Care Medicine Staff Physician [facility] Pager: [phone], If no answer or between  15:00h - 7:00h: call [phone]  07/18/2014 5:48 PM

## 2014-07-21 NOTE — Telephone Encounter (Signed)
Called and spoke to pt. Informed pt of the results and recs per MR. Pt verbalized understanding and denied any further questions or concerns at this time.  

## 2014-07-22 ENCOUNTER — Other Ambulatory Visit (HOSPITAL_COMMUNITY): Payer: Self-pay | Admitting: Family Medicine

## 2014-07-22 DIAGNOSIS — E213 Hyperparathyroidism, unspecified: Secondary | ICD-10-CM

## 2014-07-27 ENCOUNTER — Ambulatory Visit (INDEPENDENT_AMBULATORY_CARE_PROVIDER_SITE_OTHER): Payer: Medicare Other | Admitting: Adult Health

## 2014-07-27 ENCOUNTER — Encounter: Payer: Self-pay | Admitting: Adult Health

## 2014-07-27 VITALS — BP 130/80 | HR 84 | Temp 97.1°F | Ht 66.0 in | Wt 202.8 lb

## 2014-07-27 DIAGNOSIS — J45909 Unspecified asthma, uncomplicated: Secondary | ICD-10-CM | POA: Insufficient documentation

## 2014-07-27 DIAGNOSIS — R053 Chronic cough: Secondary | ICD-10-CM

## 2014-07-27 DIAGNOSIS — J4521 Mild intermittent asthma with (acute) exacerbation: Secondary | ICD-10-CM

## 2014-07-27 DIAGNOSIS — R05 Cough: Secondary | ICD-10-CM

## 2014-07-27 MED ORDER — BUDESONIDE-FORMOTEROL FUMARATE 80-4.5 MCG/ACT IN AERO: 2.0000 | INHALATION_SPRAY | Freq: Two times a day (BID) | RESPIRATORY_TRACT | Status: DC

## 2014-07-27 NOTE — Assessment & Plan Note (Signed)
Persistent symptoms post viral illness with cough  Will cont cough control regimen  Add ICS /LABA   Plan  Begin Delsym 2 tsp Twice daily  As needed  Cough  Begin Symbicort 80/4.18mcg 2 puffs Twice daily, rinse after use.  Begin Omeprazole 20mg  daily before meal .  Saline nasal rinses .As needed   Continue on Fluticasone 2 puffs in am .  Please contact office for sooner follow up if symptoms do not improve or worsen or seek emergency care  follow up Dr. Chase Caller in 6-8 weeks and As needed

## 2014-07-27 NOTE — Patient Instructions (Addendum)
Begin Delsym 2 tsp Twice daily  As needed  Cough  Begin Symbicort 80/4.16mcg 2 puffs Twice daily, rinse after use.  Begin Omeprazole 20mg  daily before meal .  Saline nasal rinses .As needed   Continue on Fluticasone 2 puffs in am .  Please contact office for sooner follow up if symptoms do not improve or worsen or seek emergency care  follow up Dr. Chase Caller in 6-8 weeks and As needed   Would follow up with PCP regarding Coronary plaques noted on CT may need further testing .

## 2014-07-27 NOTE — Addendum Note (Signed)
Addended by: Parke Poisson E on: 07/27/2014 05:30 PM   Modules accepted: Orders

## 2014-07-27 NOTE — Progress Notes (Signed)
Subjective:    Patient ID: [name] Le, female    DOB: [birth year], 68 y.o.   MRN: [id number] PCP Vidal Schwalbe, MD  HPI  IOV 07/06/2014  Chief Complaint  Patient presents with  . Pulmonary Consult    Pt referred by Dr. Harlan Stains for persistant cough.     68 year old female referred for chronic cough  - Reports insidious onset of chronic cough following upper respiratory infection that she picked up from her granddaughter 4 months ago. Since then cough has persisted. It is moderate in severity but at night it can be severe in severity. Course is possibly worse since onset. Quality is dry cough. Cough is made worse by lying down at night. No specific relieving factors. Cough is associated with a funny sensation in her throat and may be wheezing at times but no dyspnea, orthopnea, paroxysmal nocturnal dyspnea. She has tried Flonase in the past for a brief period of time but did not help.cough is a laryngeal quality  - Allergy and sinus history: she denies having had allergies. Primary care physician and suggested allergies as a possible etiology but she disagrees  - Acid reflux: She denies any acid reflux history but I notice that she is on Goody powder for arthritisand she takes this every other day. Along with this she is on fish oil. Both these agents can provoke acid reflux   - pulmonary history: She denies any history of COPD or asthma. She used to smoke marijuana until the 1980s but then quit . Denies tobacco or cocaine use   Hypertension history: She is on a ARB but not on ACE inhibitor    Dr Lorenza Cambridge Reflux Symptom Index (> 13-15 suggestive of LPR cough) 07/06/2014 She did not do   Hoarseness of problem with voice   Clearing  Of Throat   Excess throat mucus or feeling of post nasal drip   Difficulty swallowing food, liquid or tablets   Cough after eating or lying down   Breathing difficulties or choking episodes   Troublesome or annoying cough   Sensation of  something sticking in throat or lump in throat   Heartburn, chest pain, indigestion, or stomach acid coming up   TOTAL        has a past medical history of Diabetes mellitus without complication; Hypertension; and High cholesterol.   has past surgical history that includes Replacement total knee (Right) and Abdominal hysterectomy.   07/27/2014 Follow up COUGH  PT returns for follow up .  Was seen for consult 3 weeks ago for cough and dypsnea.  Underwent a methacholine challenge test that was positive. CT chest showed no evidence of interstitial lung disease,  Noted enlarged thryoid . And coronary calcification.  Was treated for cough control with GERD and AR prevention.  She did not get medication as directed.  Has had thyroid bx for goiter in April of this year for known goiter.  We discussed her MCT results and need for therapy for active symptoms  Complains that cough is day and night mainly dry .  She denies any hemoptysis, chest pain, orthopnea, PND or leg swelling    Review of Systems  Constitutional: Negative for fever and unexpected weight change.  HENT: Negative for congestion, dental problem, ear pain, nosebleeds, postnasal drip, rhinorrhea, sinus pressure, sneezing, sore throat and trouble swallowing.   Eyes: Negative for redness and itching.  Respiratory: Positive for cough and shortness of breath. Negative for chest tightness and wheezing.   Cardiovascular:  Negative for palpitations and leg swelling.  Gastrointestinal: Negative for nausea and vomiting.  Genitourinary: Negative for dysuria.  Musculoskeletal: Negative for joint swelling.  Skin: Negative for rash.  Neurological: Negative for headaches.  Hematological: Does not bruise/bleed easily.  Psychiatric/Behavioral: Negative for dysphoric mood. The patient is not nervous/anxious.        Objective:   Physical Exam  GEN: A/Ox3; pleasant , NAD, overweight   HEENT:  [place]/AT,  EACs-clear, TMs-wnl, NOSE-clear,  THROAT-clear, no lesions, no postnasal drip or exudate noted. Palpable goiter   NECK:  Supple w/ fair ROM; no JVD; normal carotid impulses w/o bruits; no thyromegaly or nodules palpated; no lymphadenopathy.  RESP  Clear  P & A; w/o, wheezes/ rales/ or rhonchi.no accessory muscle use, no dullness to percussion No stridor .   CARD:  RRR, no m/r/g  , no peripheral edema, pulses intact, no cyanosis or clubbing.  GI:   Soft & nt; nml bowel sounds; no organomegaly or masses detected.  Musco: Warm bil, no deformities or joint swelling noted.   Neuro: alert, no focal deficits noted.    Skin: Warm, no lesions or rashes

## 2014-07-27 NOTE — Assessment & Plan Note (Signed)
Begin Delsym 2 tsp Twice daily  As needed  Cough  Begin Symbicort 80/4.24mcg 2 puffs Twice daily, rinse after use.  Begin Omeprazole 20mg  daily before meal .  Saline nasal rinses .As needed   Continue on Fluticasone 2 puffs in am .  Please contact office for sooner follow up if symptoms do not improve or worsen or seek emergency care  follow up Dr. Chase Caller in 6-8 weeks and As needed

## 2014-08-04 ENCOUNTER — Encounter (HOSPITAL_COMMUNITY): Payer: Medicare Other

## 2014-08-30 ENCOUNTER — Encounter (HOSPITAL_COMMUNITY)
Admission: RE | Admit: 2014-08-30 | Discharge: 2014-08-30 | Disposition: A | Discharge status: Home / Self Care | Payer: Medicare Other | Source: Ambulatory Visit | Attending: Family Medicine | Admitting: Family Medicine

## 2014-08-30 ENCOUNTER — Ambulatory Visit (HOSPITAL_COMMUNITY)
Admission: RE | Admit: 2014-08-30 | Discharge: 2014-08-30 | Disposition: A | Discharge status: Home / Self Care | Payer: Medicare Other | Source: Ambulatory Visit | Attending: Family Medicine | Admitting: Family Medicine

## 2014-08-30 DIAGNOSIS — E049 Nontoxic goiter, unspecified: Secondary | ICD-10-CM | POA: Insufficient documentation

## 2014-08-30 DIAGNOSIS — E213 Hyperparathyroidism, unspecified: Secondary | ICD-10-CM

## 2014-08-30 MED ORDER — TECHNETIUM TC 99M SESTAMIBI - CARDIOLITE
24.8000 | Freq: Once | INTRAVENOUS | Status: AC | PRN
  Administered 2014-08-30: 09:00:00 24.8 via INTRAVENOUS

## 2014-09-22 ENCOUNTER — Ambulatory Visit: Payer: Medicare Other | Admitting: Internal Medicine

## 2014-11-24 ENCOUNTER — Encounter (HOSPITAL_COMMUNITY): Payer: Self-pay | Admitting: *Deleted

## 2014-11-24 ENCOUNTER — Emergency Department (HOSPITAL_COMMUNITY)
Admission: EM | Admit: 2014-11-24 | Discharge: 2014-11-24 | Disposition: A | Discharge status: Home / Self Care | Payer: Medicare Other | Attending: Emergency Medicine | Admitting: Emergency Medicine

## 2014-11-24 DIAGNOSIS — H5712 Ocular pain, left eye: Secondary | ICD-10-CM | POA: Insufficient documentation

## 2014-11-24 DIAGNOSIS — I10 Essential (primary) hypertension: Secondary | ICD-10-CM | POA: Diagnosis not present

## 2014-11-24 DIAGNOSIS — Z8739 Personal history of other diseases of the musculoskeletal system and connective tissue: Secondary | ICD-10-CM | POA: Diagnosis not present

## 2014-11-24 DIAGNOSIS — Z79899 Other long term (current) drug therapy: Secondary | ICD-10-CM | POA: Insufficient documentation

## 2014-11-24 DIAGNOSIS — Z792 Long term (current) use of antibiotics: Secondary | ICD-10-CM | POA: Insufficient documentation

## 2014-11-24 DIAGNOSIS — E119 Type 2 diabetes mellitus without complications: Secondary | ICD-10-CM | POA: Diagnosis not present

## 2014-11-24 MED ORDER — FLUORESCEIN SODIUM 1 MG OP STRP
1.0000 | ORAL_STRIP | Freq: Once | OPHTHALMIC | Status: AC
  Administered 2014-11-24: 1 via OPHTHALMIC
  Filled 2014-11-24: qty 1

## 2014-11-24 MED ORDER — PROPARACAINE HCL 0.5 % OP SOLN
1.0000 [drp] | Freq: Once | OPHTHALMIC | Status: AC
  Administered 2014-11-24: 1 [drp] via OPHTHALMIC
  Filled 2014-11-24: qty 15

## 2014-11-24 NOTE — ED Notes (Signed)
Dr. Ralene Bathe at the bedside.

## 2014-11-24 NOTE — Discharge Instructions (Signed)
Please go directly to [facility] for further evaluation.

## 2014-11-24 NOTE — ED Notes (Signed)
Pt. Had cataract surgery two weeks ago and feels like there is something in her left eye.

## 2014-11-24 NOTE — ED Provider Notes (Signed)
CSN: [id number]     Arrival date & time 11/24/14  3419 History   First MD Initiated Contact with Patient 11/24/14 [phone]     Chief Complaint  Patient presents with  . Post-op Problem     The history is provided by the patient. No language interpreter was used.   [name] presents for left eye pain.  She had cataract surgery two weeks ago as well as a stem for glaucoma. Her surgery was performed by Dr. Cheryln Manly at [facility] [phone]. She started having sharp pains in the left eye six days ago.  It feels like something is in her eye.  Sxs are constant and worse with sleeping, worsening.  She denies fevers, eye drainage.  She has pain with extra ocular movements.  Sxs are very different from last cataract surgery.  She tried artificial tears without relief.    Past Medical History  Diagnosis Date  . Diabetes mellitus without complication   . Hypertension   . High cholesterol    Past Surgical History  Procedure Laterality Date  . Replacement total knee Right   . Abdominal hysterectomy     Family History  Problem Relation Age of Onset  . Lung cancer Sister   . Stroke Mother   . Heart disease Sister   . Cancer Father     pts states cancer in back  . Cancer Brother     "   History  Substance Use Topics  . Smoking status: Never Smoker   . Smokeless tobacco: Never Used  . Alcohol Use: Yes     Comment: ocassionally    OB History    No data available     Review of Systems  All other systems reviewed and are negative.     Allergies  Review of patient's allergies indicates no known allergies.  Home Medications   Prior to Admission medications   Medication Sig Start Date End Date Taking? Authorizing Provider  DUREZOL 0.05 % EMUL Place 1 drop into the left eye daily.  11/04/14  Yes Historical Provider, MD  gentamicin (GARAMYCIN) 0.3 % ophthalmic solution Place 1 drop into the left eye 4 (four) times daily.  11/04/14  Yes Historical Provider, MD  ILEVRO 0.3 % SUSP  Place 1 drop into the left eye daily.  10/18/14  Yes Historical Provider, MD  metFORMIN (GLUCOPHAGE-XR) 500 MG 24 hr tablet Take 500 mg by mouth daily.  05/15/14  Yes Historical Provider, MD  NOVOLOG MIX 70/30 FLEXPEN (70-30) 100 UNIT/ML FlexPen Inject 45 Units into the skin daily with breakfast.  11/02/14  Yes Historical Provider, MD  Omeprazole-Sodium Bicarbonate (ZEGERID) 20-1100 MG CAPS capsule Take 1 capsule by mouth daily before breakfast. 07/06/14  Yes Brand Males, MD  budesonide-formoterol (SYMBICORT) 80-4.5 MCG/ACT inhaler Inhale 2 puffs into the lungs 2 (two) times daily. Patient not taking: Reported on 11/24/2014 07/27/14   [name] Mu Parrett, NP  budesonide-formoterol (SYMBICORT) 80-4.5 MCG/ACT inhaler Inhale 2 puffs into the lungs 2 (two) times daily. Patient not taking: Reported on 11/24/2014 07/27/14 07/28/14  [name] S Parrett, NP  HYDROcodone-acetaminophen (NORCO/VICODIN) 5-325 MG per tablet Take 1 tablet by mouth every 4 (four) hours as needed for pain. Patient not taking: Reported on 11/24/2014 12/01/12   Jola Schmidt, MD  ibuprofen (ADVIL,MOTRIN) 400 MG tablet Take 1 tablet (400 mg total) by mouth every 8 (eight) hours as needed for pain. Patient not taking: Reported on 11/24/2014 12/01/12   Jola Schmidt, MD  Omega-3 Fatty Acids ([place])  1000 MG CAPS Take 1 capsule by mouth daily.    Historical Provider, MD   BP 147/117 mmHg  Pulse 64  Temp(Src) 98.5 F (36.9 C) (Oral)  Resp 20  Ht 5\' 6"  (1.676 m)  Wt 185 lb (83.915 kg)  BMI 29.87 kg/m2  SpO2 98% Physical Exam  Constitutional: She is oriented to person, place, and time. She appears well-developed and well-nourished.  HENT:  Head: Normocephalic and atraumatic.  Eyes: EOM are normal. Pupils are equal, round, and reactive to light. Right eye exhibits no discharge. Left eye exhibits no discharge.  No uptake on fluorescein.  Pain relief from proparacaine drops.   Cardiovascular: Normal rate and regular rhythm.   No murmur  heard. Pulmonary/Chest: Effort normal and breath sounds normal. No respiratory distress.  Musculoskeletal: She exhibits no edema or tenderness.  Neurological: She is alert and oriented to person, place, and time. No cranial nerve deficit.  Skin: Skin is warm and dry.  Psychiatric: She has a normal mood and affect. Her behavior is normal.  Nursing note and vitals reviewed.   ED Course  Procedures (including critical care time) Labs Review Labs Reviewed - No data to display  Imaging Review No results found.   EKG Interpretation None      MDM   Final diagnoses:  Acute left eye pain    Pt here for evaluation of left eye pain that has been present for the last six days.  No evidence of corneal abrasion on eye exam.  Discussed with [facility], they will see the patient today in the office for further evaluation of eye pain postoperatively. Discussed with patient and importance of presenting directly to the office for further evaluation.    Quintella Reichert, MD 11/24/14 [phone]

## 2015-01-25 ENCOUNTER — Ambulatory Visit: Payer: Medicare Other | Admitting: *Deleted

## 2015-02-03 ENCOUNTER — Other Ambulatory Visit (HOSPITAL_COMMUNITY): Payer: Self-pay | Admitting: Orthopedic Surgery

## 2015-02-03 DIAGNOSIS — T84038A Mechanical loosening of other internal prosthetic joint, initial encounter: Secondary | ICD-10-CM

## 2015-02-03 DIAGNOSIS — Z96659 Presence of unspecified artificial knee joint: Principal | ICD-10-CM

## 2015-02-10 ENCOUNTER — Encounter (HOSPITAL_COMMUNITY): Payer: Medicare Other

## 2015-02-14 ENCOUNTER — Encounter (HOSPITAL_COMMUNITY)
Admission: RE | Admit: 2015-02-14 | Discharge: 2015-02-14 | Disposition: A | Discharge status: Home / Self Care | Payer: Medicare Other | Source: Ambulatory Visit | Attending: Orthopedic Surgery | Admitting: Orthopedic Surgery

## 2015-02-14 DIAGNOSIS — T84038A Mechanical loosening of other internal prosthetic joint, initial encounter: Secondary | ICD-10-CM | POA: Insufficient documentation

## 2015-02-14 DIAGNOSIS — Z96659 Presence of unspecified artificial knee joint: Secondary | ICD-10-CM

## 2015-02-14 MED ORDER — TECHNETIUM TC 99M MEDRONATE IV KIT
26.5000 | PACK | Freq: Once | INTRAVENOUS | Status: AC | PRN
  Administered 2015-02-14: 26.5 via INTRAVENOUS

## 2015-02-22 ENCOUNTER — Ambulatory Visit (HOSPITAL_COMMUNITY): Payer: Medicare Other

## 2015-02-22 ENCOUNTER — Encounter (HOSPITAL_COMMUNITY): Payer: Medicare Other

## 2015-03-29 ENCOUNTER — Ambulatory Visit (INDEPENDENT_AMBULATORY_CARE_PROVIDER_SITE_OTHER): Payer: Medicare Other | Admitting: Internal Medicine

## 2015-03-29 ENCOUNTER — Encounter: Payer: Self-pay | Admitting: Internal Medicine

## 2015-03-29 LAB — VITAMIN D 25 HYDROXY (VIT D DEFICIENCY, FRACTURES): VITD: 48.75 ng/mL (ref 30.00–100.00)

## 2015-03-29 MED ORDER — LOSARTAN POTASSIUM 100 MG PO TABS
100.0000 mg | ORAL_TABLET | Freq: Every day | ORAL | Status: DC
Start: 2015-03-29 — End: 2015-10-11

## 2015-03-29 MED ORDER — FUROSEMIDE 20 MG PO TABS: 20.0000 mg | ORAL_TABLET | Freq: Every day | ORAL | Status: DC

## 2015-03-29 NOTE — Progress Notes (Signed)
Patient ID: [name] Le, female   DOB: Aug 12, 1946, 69 y.o.   MRN: [id number]   HPI  [name] Le is a 69 y.o.-year-old female, referred by her PCP, Vidal Schwalbe, MD, for evaluation and treatment of hypercalcemia.  Pt was dx with hypercalcemia in 2012. I reviewed pt's pertinent labs: No PTH levels available for review. 01/24/2015: calcium 11.9,  GFR 46 Lab Results  Component Value Date   CALCIUM 10.2 12/01/2012   CALCIUM 10.3 04/02/2011   CALCIUM 10.3 04/01/2011   CALCIUM 9.7 03/31/2011   CALCIUM 11.1* 03/26/2011   CALCIUM 10.4 08/24/2008   CALCIUM 10.4 11/18/2007   Of note, the patient had a Tc sestamibi scan (08/30/2014) that returned negative for a parathyroid adenoma.  No previous available DEXA scans. No fractures or falls.   No h/o kidney stones.  + h/o stage 3 CKD. Last BUN/Cr:  01/24/2015: BUN/creatinine: 27/1.38, GFR 46 Lab Results  Component Value Date   BUN 19 12/01/2012   CREATININE 0.91 12/01/2012   Pt is on HCTZ (Hyzaar).  No h/o vitamin D deficiency, but no vitamin D levels available for review.  Pt is not on calcium supplements. She was on vitamin D supplements, takes this when she remembers - OTC ? dose; she also eats dairy ("I drink a lot of milk" and "potato chips icecream") and green, leafy, vegetables.  Pt does not have a FH of hypercalcemia, pituitary tumors, thyroid cancer, or osteoporosis.   I reviewed her chart and she also has a history of  DM2 - controlled, HTN, OA, HL, mild CAD, multinodular goiter s/p FNA 3 on 12/2013 with benign results.  She exercises 3 times a week.  ROS: Constitutional: no weight gain/loss, + fatigue,  + Hot flashes, +  Excessive urination, nocturia due to drinking a lot of water, +  Poor sleep Eyes: no blurry vision, no xerophthalmia ENT: no sore throat, + nodules palpated in throat, no dysphagia/odynophagia, no hoarseness Cardiovascular: no CP/+ SOB/no palpitations/leg swelling Respiratory: + cough/+  SOB Gastrointestinal: no N/V/D/C Musculoskeletal: no muscle/joint aches Skin: no rashes Neurological: no tremors/numbness/tingling/dizziness Psychiatric: no depression/anxiety  Past Medical History  Diagnosis Date  . Diabetes mellitus without complication   . Hypertension   . High cholesterol    Past Surgical History  Procedure Laterality Date  . Replacement total knee Right   . Abdominal hysterectomy     History   Social History  . Marital Status: Married    Spouse Name: N/A  . Number of Children: 1   Occupational History  . Retired Geneticist, molecular)    Social History Main Topics  . Smoking status: Never Smoker   . Smokeless tobacco: Never Used  . Alcohol Use: Yes     Comment: ocassionally   . Drug Use: No     Comment: Previously smoked. Quit in 1981   Current Outpatient Prescriptions on File Prior to Visit  Medication Sig Dispense Refill  . budesonide-formoterol (SYMBICORT) 80-4.5 MCG/ACT inhaler Inhale 2 puffs into the lungs 2 (two) times daily. 10.2 g 5  . HYDROcodone-acetaminophen (NORCO/VICODIN) 5-325 MG per tablet Take 1 tablet by mouth every 4 (four) hours as needed for pain. 15 tablet 0  . metFORMIN (GLUCOPHAGE-XR) 500 MG 24 hr tablet Take 500 mg by mouth daily.   1  . NOVOLOG MIX 70/30 FLEXPEN (70-30) 100 UNIT/ML FlexPen Inject 45 Units into the skin daily with breakfast.   5  . Omega-3 Fatty Acids (FISH OIL) 1000 MG CAPS Take 1 capsule by mouth daily.    Marland Kitchen  budesonide-formoterol (SYMBICORT) 80-4.5 MCG/ACT inhaler Inhale 2 puffs into the lungs 2 (two) times daily. (Patient not taking: Reported on 11/24/2014) 1 Inhaler 0  . DUREZOL 0.05 % EMUL Place 1 drop into the left eye daily.   2  . gentamicin (GARAMYCIN) 0.3 % ophthalmic solution Place 1 drop into the left eye 4 (four) times daily.   1  . ibuprofen (ADVIL,MOTRIN) 400 MG tablet Take 1 tablet (400 mg total) by mouth every 8 (eight) hours as needed for pain. (Patient not taking: Reported on 11/24/2014) 10 tablet 0  .  ILEVRO 0.3 % SUSP Place 1 drop into the left eye daily.   2  . Omeprazole-Sodium Bicarbonate (ZEGERID) 20-1100 MG CAPS capsule Take 1 capsule by mouth daily before breakfast. (Patient not taking: Reported on 03/29/2015) 28 each 5   No current facility-administered medications on file prior to visit.   Allergies  Allergen Reactions  . Ace Inhibitors Cough   Family History  Problem Relation Age of Onset  . Lung cancer Sister   . Stroke Mother   . Heart disease Sister   . Cancer Father     pts states cancer in back  . Cancer Brother     "  Also, diabetes and hypertension in sisters, nephews, nieces  PE: BP 132/84 mmHg  Temp(Src) 98.9 F (37.2 C) (Oral)  Resp 12  Ht 5' 5.5" (1.664 m)  Wt 195 lb 6.4 oz (88.633 kg)  BMI 32.01 kg/m2 Wt Readings from Last 3 Encounters:  03/29/15 195 lb 6.4 oz (88.633 kg)  11/24/14 185 lb (83.915 kg)  07/27/14 202 lb 12.8 oz (91.989 kg)   Constitutional: overweight, in NAD. No kyphosis. Eyes: PERRLA, EOMI, no exophthalmos ENT: moist mucous membranes, + L>R thyromegaly, no cervical lymphadenopathy Cardiovascular: RRR, No MRG Respiratory: CTA B Gastrointestinal: abdomen soft, NT, ND, BS+ Musculoskeletal: no deformities, strength intact in all 4 Skin: moist, warm, no rashes Neurological: no tremor with outstretched hands, DTR normal in all 4  Assessment: 1. Hypercalcemia/hyperparathyroidism  Plan: Patient has had several instances of elevated calcium, with the highest level being at 11.9 (a 0.6-10.3). A corresponding intact PTH level was also high,  Per review of records from PCP, however, I do not have this value. -  It is unclear whether the patient also has vitamin D deficiency >>  We'll need to check this today - No apparent complications from hypercalcemia: no h/o nephrolithiasis, no osteoporosis, no fractures. No abdominal pain (other than related to diverticulitis/gastritis), depression, bone pain. - I discussed with the patient about the  physiology of calcium and parathyroid hormone, and possible side effects from increased PTH, including kidney stones, osteoporosis, abdominal pain, etc.  - We discussed that we need to check whether her hyperparathyroidism is primary (Familial hypercalcemic hypocalciuria or parathyroid adenoma) or secondary (to conditions like: vitamin D deficiency, calcium malabsorption, hypercalciuria, renal insufficiency, etc.). - I discussed with her that we first need to check andbring her vitamin D level to normal so we can further investigate the parathyroid status. I explained that in the setting of a low vitamin D, the parathyroid hormone can be elevated, which is not a pathologic finding. However, if the PTH is elevated in the setting of a normal vitamin D, we will further need to investigate her for primary or secondary hyperparathyroidism. - after we normalize the vitamin D level, we'll need to check: calcium level intact PTH ([facility]) Magnesium Phosphorus vitamin D- 25 HO and 1,25 HO 24h urinary calcium/creatinine ratio - We  discussed possible consequences of hyperparathyroidism: ~1/3 pts will develop complications over 15 years (OP, nephrolithiasis).  - If the tests indicate a parathyroid adenoma,  I explained that I would recommend surgery.  - we discussed about criteria for parathyroid surgery:  Increased calcium by more than 1 mg/dL above the upper limit of normal  Kidney ds.  Osteoporosis (or Vb fx) Age <59 years old New (2013): High UCa >400 mg/d and increased stone risk by biochemical stone risk analysis Presence of nephrolithiasis or nephrocalcinosis Pt's preference!  -  As of now, the patient is not keen to the idea of surgery, however, I will discuss with her again if the results point stores primary hyperparathyroidism. She had a normal technetium sestamibi scan, which can give false negative results , but this also brings into question whether her hypercalcemia is due to her CKD. I agree  with PCP that the degree of hypercalcemia is higher than expected from secondary hyperparathyroidism do to see Katie. - I advised the patient to try to get approximately 1000 mg of vitamin D from the diet. I will advise her about vitamin D supplement dose when the results of the vitamin D level are back. - I will see the patient back in  4 months  - time spent with the patient: 1 hour, of which >50% was spent in obtaining information about her condition; reviewing her previous labs, evaluations, and treatments, counseling her about her condition (please see the discussed topics above), and developing a plan to further investigate it.  Office Visit on 03/29/2015  Component Date Value Ref Range Status  . VITD 03/29/2015 48.75  30.00 - 100.00 ng/mL Final   Vitamin D normal. Continue current supplementation dose (please see history of present illness). We'll continue with the plan to obtain labs in a month after she stopped HCTZ. I will order.

## 2015-03-29 NOTE — Patient Instructions (Addendum)
Please stop Hyzaar. Start Losartan 100 mg daily. Start Furosemide (Lasix) 20 mg daily.   Please make sure you get ~1000 mg calcium from your diet a day.  Please stop at the lab.  I will let you know if we need to start vitamin D supplements.  Please come back for a follow-up appointment in 6 months.

## 2015-03-31 ENCOUNTER — Encounter: Payer: Self-pay | Admitting: *Deleted

## 2015-07-08 ENCOUNTER — Other Ambulatory Visit: Payer: Self-pay | Admitting: Internal Medicine

## 2015-07-08 ENCOUNTER — Telehealth: Payer: Self-pay | Admitting: Internal Medicine

## 2015-07-08 MED ORDER — BUDESONIDE-FORMOTEROL FUMARATE 80-4.5 MCG/ACT IN AERO: 2.0000 | INHALATION_SPRAY | Freq: Two times a day (BID) | RESPIRATORY_TRACT | Status: DC

## 2015-07-08 MED ORDER — PREDNISONE 10 MG PO TABS: ORAL_TABLET | ORAL | Status: DC

## 2015-07-08 MED ORDER — AZITHROMYCIN 250 MG PO TABS: ORAL_TABLET | ORAL | Status: DC

## 2015-07-08 NOTE — Telephone Encounter (Signed)
Pt aware of recs.  appt scheduled next Friday with TP.  rx's sent in.  Nothing further needed.

## 2015-07-08 NOTE — Telephone Encounter (Signed)
Spoke with pt, c/o prod cough tinged with blood since last night.  Has been coughing X1 month.  Pt notes chest pain. Denies fever, sinus congestion.  Pt has been taking symbicort, robitussin, and mucinex.   Last seen 07/27/14 by TP.  No openings in clinic today.    [facility] [address].    MR please advise on recs.  Thanks!

## 2015-07-08 NOTE — Telephone Encounter (Signed)
Tide over for now with  Please take prednisone 40 mg x1 day, then 30 mg x1 day, then 20 mg x1 day, then 10 mg x1 day, and then 5 mg x1 day and stop  And Z pak  Give appt with TP in 1 week  If worse go to ER anytime

## 2015-07-08 NOTE — Telephone Encounter (Signed)
Pt not seen since 07/2014, recs to follow up with MR in 4-6 weeks - not seen.  Per the 7.16.16 ov with PCP, zegerid was listed as 'not taking'.  Refill refused and deferred to PCP.

## 2015-07-15 ENCOUNTER — Telehealth: Payer: Self-pay | Admitting: Internal Medicine

## 2015-07-15 ENCOUNTER — Ambulatory Visit (INDEPENDENT_AMBULATORY_CARE_PROVIDER_SITE_OTHER)
Admission: RE | Admit: 2015-07-15 | Discharge: 2015-07-15 | Disposition: A | Discharge status: Home / Self Care | Payer: Medicare Other | Source: Ambulatory Visit | Attending: Adult Health | Admitting: Adult Health

## 2015-07-15 ENCOUNTER — Encounter: Payer: Self-pay | Admitting: Adult Health

## 2015-07-15 ENCOUNTER — Ambulatory Visit (INDEPENDENT_AMBULATORY_CARE_PROVIDER_SITE_OTHER): Payer: Medicare Other | Admitting: Adult Health

## 2015-07-15 VITALS — BP 158/98 | HR 80 | Temp 98.8°F | Ht 65.5 in | Wt 191.6 lb

## 2015-07-15 DIAGNOSIS — J4521 Mild intermittent asthma with (acute) exacerbation: Secondary | ICD-10-CM

## 2015-07-15 MED ORDER — BUDESONIDE-FORMOTEROL FUMARATE 160-4.5 MCG/ACT IN AERO: 2.0000 | INHALATION_SPRAY | Freq: Two times a day (BID) | RESPIRATORY_TRACT | Status: DC

## 2015-07-15 MED ORDER — AMOXICILLIN-POT CLAVULANATE 875-125 MG PO TABS: ORAL_TABLET | ORAL | Status: DC

## 2015-07-15 MED ORDER — ALBUTEROL SULFATE HFA 108 (90 BASE) MCG/ACT IN AERS: 2.0000 | INHALATION_SPRAY | RESPIRATORY_TRACT | Status: DC | PRN

## 2015-07-15 MED ORDER — PREDNISONE 10 MG PO TABS: ORAL_TABLET | ORAL | Status: DC

## 2015-07-15 MED ORDER — LEVALBUTEROL HCL 0.63 MG/3ML IN NEBU
0.6300 mg | INHALATION_SOLUTION | Freq: Once | RESPIRATORY_TRACT | Status: AC
  Administered 2015-07-15: 0.63 mg via RESPIRATORY_TRACT

## 2015-07-15 NOTE — Progress Notes (Signed)
   Subjective:    Patient ID: [name] Le, female    DOB: 1946-07-27, 69 y.o.   MRN: [id number]  HPI 69 yo female with asthma and cough   TEST  methacholine challenge test that was positive. CT chest showed no evidence of interstitial lung disease,   07/15/2015 Acute OV  Complains over last 3 month has been having more cough and congestion .  Feels she got asthma flare after cleaning with bleach.  More dry cough and wheezing . Then about 1 month ago, got worse  Called in last week called in zpack and prednisone for 4 days  She does not feel any better.  She complains of severe cough with blood tinged mucus. No fever or chest pain or leg swelling.  No n//vd. Coughs so hard she feels she could vomit.  Says she uses symbicort As needed  . We discussed correct use of controller inhaler.     Review of Systems  Constitutional:   No  weight loss, night sweats,  Fevers, chills, fatigue, or  lassitude.  HEENT:   No headaches,  Difficulty swallowing,  Tooth/dental problems, or  Sore throat,                No sneezing, itching, ear ache, nasal congestion, post nasal drip,   CV:  No chest pain,  Orthopnea, PND, swelling in lower extremities, anasarca, dizziness, palpitations, syncope.   GI  No heartburn, indigestion, abdominal pain, nausea, vomiting, diarrhea, change in bowel habits, loss of appetite, bloody stools.   Resp: No shortness of breath with exertion or at rest.  No excess mucus, no productive cough,  No non-productive cough,  No coughing up of blood.  No change in color of mucus.  No wheezing.  No chest wall deformity  Skin: no rash or lesions.  GU: no dysuria, change in color of urine, no urgency or frequency.  No flank pain, no hematuria   MS:  No joint pain or swelling.  No decreased range of motion.  No back pain.  Psych:  No change in mood or affect. No depression or anxiety.  No memory loss.         Objective:   Physical Exam GEN: A/Ox3; pleasant , NAD, obese     HEENT:  [place]/AT,  EACs-clear, TMs-wnl, NOSE-clear, drainage THROAT-clear, no lesions, no postnasal drip or exudate noted. Poor denttion  NECK:  Supple w/ fair ROM; no JVD; normal carotid impulses w/o bruits; +goiter   RESP  Few trace wheezing, no stridor , speaks in full sentences. .no accessory muscle use, no dullness to percussion  CARD:  RRR, no m/r/g  , no peripheral edema, pulses intact, no cyanosis or clubbing.  GI:   Soft & nt; nml bowel sounds; no organomegaly or masses detected.  Musco: Warm bil, no deformities or joint swelling noted.   Neuro: alert, no focal deficits noted.    Skin: Warm, no lesions or rashes         Assessment & Plan:

## 2015-07-15 NOTE — Addendum Note (Signed)
Addended by: Osa Craver on: 07/15/2015 10:09 AM   Modules accepted: Orders, Medications

## 2015-07-15 NOTE — Assessment & Plan Note (Addendum)
Asthmatic bronchitis flare  xopenex neb x 1  Check cxr today  Increase symbicort dose and pt education given  Add SABA for As needed  Use.   Plan  Augmentin 875mg  Twice daily  For 7 days  Prednisone taper over next week.  Increase Symbicort 160/4.56mcg 2 puffs Twice daily   Mucinex DM Twice daily  As needed  Cough/congestion .  Chest xray today .  Begin Zyrtec 10mg  At bedtime  As needed  Drainage .  ProAir inhaler 2 puffs every 4hr as needed for wheezing - this is your rescue inhaler.  Follow up Dr. Chase Caller in 6-8 weeks and As needed   Please contact office for sooner follow up if symptoms do not improve or worsen or seek emergency care

## 2015-07-15 NOTE — Patient Instructions (Addendum)
Follow up with your family doctor as your blood pressure is elevated.  Augmentin 875mg  Twice daily  For 7 days  Prednisone taper over next week.  Increase Symbicort 160/4.53mcg 2 puffs Twice daily   Mucinex DM Twice daily  As needed  Cough/congestion .  Chest xray today .  Begin Zyrtec 10mg  At bedtime  As needed  Drainage .  ProAir inhaler 2 puffs every 4hr as needed for wheezing - this is your rescue inhaler.  Follow up Dr. Chase Caller in 6-8 weeks and As needed   Please contact office for sooner follow up if symptoms do not improve or worsen or seek emergency care

## 2015-07-15 NOTE — Telephone Encounter (Signed)
Pt needs f/u with MR in 6 weeks. Daneil Dan can we use a held spot?

## 2015-07-18 NOTE — Telephone Encounter (Signed)
Patient needs follow up appointment in 6 weeks with Dr. Chase Caller.   Can we use a held spot or can we schedule 1st available (in January)

## 2015-07-19 NOTE — Progress Notes (Signed)
Quick Note:  LVM for pt to return call ______ 

## 2015-07-19 NOTE — Telephone Encounter (Signed)
LMTCb for pt.  Per TP pt can be seen in 6-8 weeks. First available in early Jan is fine.

## 2015-07-20 NOTE — Telephone Encounter (Signed)
Called made pt aware okay for jan appt. This has been scheduled. Nothing further needed

## 2015-07-29 ENCOUNTER — Telehealth: Payer: Self-pay | Admitting: Internal Medicine

## 2015-07-29 NOTE — Telephone Encounter (Signed)
Rec'd request for records from [facility] faxed 29 pages to them 07/28/2015

## 2015-08-03 ENCOUNTER — Other Ambulatory Visit: Payer: Self-pay | Admitting: Internal Medicine

## 2015-09-05 ENCOUNTER — Other Ambulatory Visit: Payer: Self-pay | Admitting: Internal Medicine

## 2015-09-20 ENCOUNTER — Ambulatory Visit: Payer: Medicare Other | Admitting: Internal Medicine

## 2015-09-21 ENCOUNTER — Ambulatory Visit: Payer: Medicare Other | Admitting: Internal Medicine

## 2015-09-27 ENCOUNTER — Encounter: Payer: Self-pay | Admitting: Gastroenterology

## 2015-10-11 ENCOUNTER — Ambulatory Visit (INDEPENDENT_AMBULATORY_CARE_PROVIDER_SITE_OTHER): Payer: Medicare Other | Admitting: Internal Medicine

## 2015-10-11 ENCOUNTER — Encounter: Payer: Self-pay | Admitting: Internal Medicine

## 2015-10-11 DIAGNOSIS — I10 Essential (primary) hypertension: Secondary | ICD-10-CM | POA: Insufficient documentation

## 2015-10-11 DIAGNOSIS — E213 Hyperparathyroidism, unspecified: Secondary | ICD-10-CM | POA: Diagnosis not present

## 2015-10-11 LAB — BASIC METABOLIC PANEL WITH GFR
BUN: 19 mg/dL (ref 7–25)
CO2: 27 mmol/L (ref 20–31)
Calcium: 10.7 mg/dL — ABNORMAL HIGH (ref 8.6–10.4)
Chloride: 109 mmol/L (ref 98–110)
Creat: 0.94 mg/dL — ABNORMAL HIGH (ref 0.60–0.93)
GFR, Est African American: 71 mL/min (ref >=60)
GFR, Est Non African American: 62 mL/min (ref >=60)
Glucose, Bld: 123 mg/dL — ABNORMAL HIGH (ref 65–99)
Potassium: 4 mmol/L (ref 3.5–5.3)
Sodium: 142 mmol/L (ref 135–146)

## 2015-10-11 LAB — VITAMIN D 25 HYDROXY (VIT D DEFICIENCY, FRACTURES): VITD: 30.37 ng/mL (ref 30.00–100.00)

## 2015-10-11 LAB — PHOSPHORUS: Phosphorus: 2.9 mg/dL (ref 2.3–4.6)

## 2015-10-11 LAB — MAGNESIUM: Magnesium: 2 mg/dL (ref 1.5–2.5)

## 2015-10-11 NOTE — Progress Notes (Addendum)
Patient ID: [name] Le, female   DOB: 08-02-1946, 70 y.o.   MRN: [id number]   HPI  [name] Le is a 70 y.o.-year-old female, referred by her PCP, Vidal Schwalbe, MD, for evaluation and treatment of hypercalcemia. Last visit 6 mo ago.  Pt was dx with hypercalcemia in 2012. I reviewed pt's pertinent labs: No PTH levels available for review. 01/24/2015: calcium 11.9,  GFR 46 Lab Results  Component Value Date   CALCIUM 10.2 12/01/2012   CALCIUM 10.3 04/02/2011   CALCIUM 10.3 04/01/2011   CALCIUM 9.7 03/31/2011   CALCIUM 11.1* 03/26/2011   CALCIUM 10.4 08/24/2008   CALCIUM 10.4 11/18/2007   Of note, the patient had a Tc sestamibi scan (08/30/2014) that returned negative for a parathyroid adenoma.  No previous available DEXA scans. No fractures or falls.   No h/o kidney stones.  + h/o stage 3 CKD. Last BUN/Cr:  01/24/2015: BUN/creatinine: 27/1.38, GFR 46 Lab Results  Component Value Date   BUN 19 12/01/2012   CREATININE 0.91 12/01/2012   At last visit pt was on HCTZ >> we changed this to Furosemide and planned to recheck pertinent labs in 1 mo after the change >> she did not return. She subsequently stopped Lasix b/c increased urination. As her BP stayed high >> she was started on Valsartan and Amlodipine (she could not tolerate losartan - fatigue and leg pain).  No h/o vitamin D deficiency, in 03/2015 vitamin D was 48.7.  Pt is not on calcium supplements. She was on vitamin D supplements, ? Dose, sparsely, as she remembered to take it.  Pt does not have a FH of hypercalcemia, pituitary tumors, thyroid cancer, or osteoporosis.   She also has a history of  DM2 - controlled, HTN, OA, HL, mild CAD, multinodular goiter s/p FNA 3 on 12/2013 with benign results.  ROS: Constitutional:+ weight loss, + fatigue,  + Hot flashes, +  Excessive urination, nocturia due to drinking a lot of water Eyes: + blurry vision, no xerophthalmia ENT: no sore throat, + nodules palpated in throat,  no dysphagia/odynophagia, no hoarseness Cardiovascular: no CP/SOB/no palpitations/leg swelling Respiratory: + cough/no SOB Gastrointestinal: no N/V/D/C Musculoskeletal: no muscle/+joint aches Skin: no rashes Neurological: no tremors/numbness/tingling/dizziness  I reviewed pt's medications, allergies, PMH, social hx, family hx, and changes were documented in the history of present illness. Otherwise, unchanged from my initial visit note.  Past Medical History  Diagnosis Date  . Diabetes mellitus without complication ([place])   . Hypertension   . High cholesterol    Past Surgical History  Procedure Laterality Date  . Replacement total knee Right   . Abdominal hysterectomy     History   Social History  . Marital Status: Married    Spouse Name: N/A  . Number of Children: 1   Occupational History  . Retired Geneticist, molecular)    Social History Main Topics  . Smoking status: Never Smoker   . Smokeless tobacco: Never Used  . Alcohol Use: Yes     Comment: ocassionally   . Drug Use: No     Comment: Previously smoked. Quit in 1981   Current Outpatient Prescriptions on File Prior to Visit  Medication Sig Dispense Refill  . albuterol (PROAIR HFA) 108 (90 BASE) MCG/ACT inhaler Inhale 2 puffs into the lungs every 4 (four) hours as needed for wheezing or shortness of breath. 1 Inhaler 5  . budesonide-formoterol (SYMBICORT) 160-4.5 MCG/ACT inhaler Inhale 2 puffs into the lungs 2 (two) times daily. 1 Inhaler 5  .  Insulin Pen Needle (NOVOFINE) 32G X 6 MM MISC     . metFORMIN (GLUCOPHAGE-XR) 500 MG 24 hr tablet Take 500 mg by mouth daily.   1  . NOVOLOG MIX 70/30 FLEXPEN (70-30) 100 UNIT/ML FlexPen Inject 45 Units into the skin daily with breakfast.   5  . Omeprazole-Sodium Bicarbonate (ZEGERID) 20-1100 MG CAPS capsule TAKE 1 CAPSULE BY MOUTH DAILY BEFORE BREAKFAST. 28 capsule 3  . ONE TOUCH ULTRA TEST test strip 3 (three) times daily. for testing  3  . pravastatin (PRAVACHOL) 80 MG tablet TAKE 1  TABLET IN THE EVENING DAILY  5   No current facility-administered medications on file prior to visit.   Allergies  Allergen Reactions  . Ace Inhibitors Cough   Family History  Problem Relation Age of Onset  . Lung cancer Sister   . Stroke Mother   . Heart disease Sister   . Cancer Father     pts states cancer in back  . Cancer Brother     "  Also, diabetes and hypertension in sisters, nephews, nieces  PE: BP 160/80 mmHg  Temp(Src) 98.4 F (36.9 C) (Oral)  Resp 12  Wt 191 lb 9.6 oz (86.909 kg) Body mass index is 31.39 kg/(m^2). Wt Readings from Last 3 Encounters:  10/11/15 191 lb 9.6 oz (86.909 kg)  07/15/15 191 lb 9.6 oz (86.909 kg)  03/29/15 195 lb 6.4 oz (88.633 kg)   Constitutional: overweight, in NAD. No kyphosis. Eyes: PERRLA, EOMI, no exophthalmos ENT: moist mucous membranes, + L>R thyromegaly, no cervical lymphadenopathy Cardiovascular: RRR, No MRG Respiratory: CTA B Gastrointestinal: abdomen soft, NT, ND, BS+ Musculoskeletal: no deformities, strength intact in all 4 Skin: moist, warm, no rashes Neurological: no tremor with outstretched hands, DTR normal in all 4  Assessment: 1. Hypercalcemia/hyperparathyroidism  2. HTN  Plan: 1. Patient has had several instances of elevated calcium, with the highest level being at 11.9 (a 0.6-10.3). A corresponding intact PTH level was also high,  Per review of records from PCP, however, I do not have this value.  - No apparent complications from hypercalcemia: no h/o nephrolithiasis, no osteoporosis, no fractures. No abdominal pain, depression, bone pain. - At last visit, 6 mo ago, she was on HCTZ >> we stopped this and switched to Lasix and asked her to come back for repeat parathyroid-related labs >> did not return.  - at last visit, we also checked vitamin D level >> normal >> we did not start supplements - at this visit, we will check: calcium level intact PTH ([facility]) Magnesium Phosphorus vitamin D- 25 HO and 1,25  HO 24h urinary calcium/creatinine ratio - I explained how the collection is done - We discussed possible consequences of hyperparathyroidism: ~1/3 pts will develop complications over 15 years (OP, nephrolithiasis).  - If the tests indicate a parathyroid adenoma,  I explained that I would recommend surgery. She is not very keen to this idea. - she meets criteria for parathyroid surgery:  Increased calcium by more than 1 mg/dL above the upper limit of normal  Kidney ds.  Osteoporosis (or Vb fx) Age <79 years old New (2013): High UCa >400 mg/d and increased stone risk by biochemical stone risk analysis Presence of nephrolithiasis or nephrocalcinosis Pt's preference!  - of note, She had a normal technetium sestamibi scan, which can give false negative results , but this also brings into question whether her hypercalcemia is due to her CKD. I agree with PCP that the degree of hypercalcemia is higher than  expected from secondary hyperparathyroidism. - I will see the patient back in  6 months  2. HTN - pt's BP is not controlled (per review of the chart, this was actually high even when on HCTZ): Vitals Conversion 10/11/2015 07/15/2015 03/29/2015 11/24/2014 11/24/2014  BP 160/80 158/98 132/84 169/92 163/79   Vitals Conversion 11/24/2014 11/24/2014 11/24/2014 11/24/2014 07/27/2014  BP 142/76 160/79 147/117 170/87 130/80   -  we cannot restart HCTZ 2/2 Hypercalcemia - she will d/w PCP >> scheduled an appt  Office Visit on 10/11/2015  Component Date Value Ref Range Status  . VITD 10/11/2015 30.37  30.00 - 100.00 ng/mL Final  . PTH 10/11/2015 74* 15 - 65 pg/mL Final  . Sodium 10/11/2015 142  135 - 146 mmol/L Final  . Potassium 10/11/2015 4.0  3.5 - 5.3 mmol/L Final  . Chloride 10/11/2015 109  98 - 110 mmol/L Final  . CO2 10/11/2015 27  20 - 31 mmol/L Final  . Glucose, Bld 10/11/2015 123* 65 - 99 mg/dL Final  . BUN 10/11/2015 19  7 - 25 mg/dL Final  . Creat 10/11/2015 0.94* 0.60 - 0.93 mg/dL Final  .  Calcium 10/11/2015 10.7* 8.6 - 10.4 mg/dL Final  . GFR, Est African American 10/11/2015 71  >=60 mL/min Final  . GFR, Est Non African American 10/11/2015 62  >=60 mL/min Final   Comment:   The estimated GFR is a calculation valid for adults (>=65 years old) that uses the CKD-EPI algorithm to adjust for age and sex. It is   not to be used for children, pregnant women, hospitalized patients,    patients on dialysis, or with rapidly changing kidney function. According to the NKDEP, eGFR >89 is normal, 60-89 shows mild impairment, 30-59 shows moderate impairment, 15-29 shows severe impairment and <15 is ESRD.     . Magnesium 10/11/2015 2.0  1.5 - 2.5 mg/dL Final  . Phosphorus 10/11/2015 2.9  2.3 - 4.6 mg/dL Final  . Vitamin D 1, 25 (OH)2 Total 10/11/2015 70  18 - 72 pg/mL Final  . Vitamin D3 1, 25 (OH)2 10/11/2015 70   Final  . Vitamin D2 1, 25 (OH)2 10/11/2015 <8   Final   Comment: Vitamin D3, 1,25(OH)2 indicates both endogenous production and supplementation.  Vitamin D2, 1,25(OH)2 is an indicator of exogeous sources, such as diet or supplementation.  Interpretation and therapy are based on measurement of Vitamin D,1,25(OH)2, Total. This test was developed and its analytical performance characteristics have been determined by [facility], [place], [place]. It has not been cleared or approved by the FDA. This assay has been validated pursuant to the CLIA regulations and is used for clinical purposes.    High calcium and PTH, with normal 25 hydroxy vitamin D, magnesium, phosphorus. Vitamin 1, 25 dihydroxy vitamin D is at the upper limit of normal. We'll advise her to start the urine collection for 24-hour calcium excretion.  Addendum: Component     Latest Ref Rng 10/17/2015  Calcium, Ur     Not estab mg/dL 17  Calcium, 24 hour urine     35 - 250 mg/24 h 247  Creatinine, Urine     20 - 320 mg/dL 146  Creatinine, 24H Ur     0.63 - 2.50 g/24 h 2.12  24h Urine  calcium is at the ULN >> will suggest referral to surgery.

## 2015-10-11 NOTE — Patient Instructions (Signed)
Please stop at the lab.  Please collect a 24h urine for calcium. Patient information (Up-to-Date): Collection of a 24-hour urine specimen   - You should collect every drop of urine during each 24-hour period. It does not matter how much or little urine is passed each time, as long as every drop is collected. - Begin the urine collection in the morning after you wake up, after you have emptied your bladder for the first time. - Urinate (empty the bladder) for the first time and flush it down the toilet. Note the exact time (eg, 6:15 AM). You will begin the urine collection at this time. - Collect every drop of urine during the day and night in an empty collection bottle. Store the bottle at room temperature or in the refrigerator. - If you need to have a bowel movement, any urine passed with the bowel movement should be collected. Try not to include feces with the urine collection. If feces does get mixed in, do not try to remove the feces from the urine collection bottle. - Finish by collecting the first urine passed the next morning, adding it to the collection bottle. This should be within ten minutes before or after the time of the first morning void on the first day (which was flushed). In this example, you would try to void between 6:05 and 6:25 on the second day. - If you need to urinate one hour before the final collection time, drink a full glass of water so that you can void again at the appropriate time. If you have to urinate 20 minutes before, try to hold the urine until the proper time. - Please note the exact time of the final collection, even if it is not the same time as when collection began on day 1. - The bottle(s) may be kept at room temperature for a day or two, but should be kept cool or refrigerated for longer periods of time.  Please return in 6 months.

## 2015-10-12 LAB — PARATHYROID HORMONE, INTACT (NO CA): PTH: 74 pg/mL — ABNORMAL HIGH (ref 15–65)

## 2015-10-14 DIAGNOSIS — E213 Hyperparathyroidism, unspecified: Secondary | ICD-10-CM | POA: Insufficient documentation

## 2015-10-14 LAB — VITAMIN D 1,25 DIHYDROXY
Vitamin D 1, 25 (OH)2 Total: 70 pg/mL (ref 18–72)
Vitamin D2 1, 25 (OH)2: <8 pg/mL
Vitamin D3 1, 25 (OH)2: 70 pg/mL

## 2015-10-17 NOTE — Addendum Note (Signed)
Addended by: Kaylyn Lim I on: 10/17/2015 11:47 AM   Modules accepted: Orders

## 2015-10-17 NOTE — Addendum Note (Signed)
Addended by: Kaylyn Lim I on: 10/17/2015 11:46 AM   Modules accepted: Orders

## 2015-10-18 LAB — CALCIUM, URINE, 24 HOUR
Calcium, 24 hour urine: 247 mg/24 h (ref 35–250)
Calcium, Ur: 17 mg/dL

## 2015-10-18 LAB — CREATININE, URINE, 24 HOUR
Creatinine, 24H Ur: 2.12 g/(24.h) (ref 0.63–2.50)
Creatinine, Urine: 146 mg/dL (ref 20–320)

## 2015-10-19 ENCOUNTER — Encounter: Payer: Self-pay | Admitting: *Deleted

## 2015-11-07 ENCOUNTER — Ambulatory Visit (AMBULATORY_SURGERY_CENTER): Payer: Self-pay | Admitting: *Deleted

## 2015-11-07 VITALS — Ht 66.0 in | Wt 190.0 lb

## 2015-11-07 DIAGNOSIS — E785 Hyperlipidemia, unspecified: Secondary | ICD-10-CM | POA: Insufficient documentation

## 2015-11-07 DIAGNOSIS — E1121 Type 2 diabetes mellitus with diabetic nephropathy: Secondary | ICD-10-CM | POA: Insufficient documentation

## 2015-11-07 DIAGNOSIS — N183 Chronic kidney disease, stage 3 unspecified: Secondary | ICD-10-CM | POA: Insufficient documentation

## 2015-11-07 DIAGNOSIS — I129 Hypertensive chronic kidney disease with stage 1 through stage 4 chronic kidney disease, or unspecified chronic kidney disease: Secondary | ICD-10-CM | POA: Insufficient documentation

## 2015-11-07 DIAGNOSIS — E049 Nontoxic goiter, unspecified: Secondary | ICD-10-CM | POA: Insufficient documentation

## 2015-11-07 DIAGNOSIS — Z1211 Encounter for screening for malignant neoplasm of colon: Secondary | ICD-10-CM

## 2015-11-07 DIAGNOSIS — D509 Iron deficiency anemia, unspecified: Secondary | ICD-10-CM

## 2015-11-07 DIAGNOSIS — N182 Chronic kidney disease, stage 2 (mild): Secondary | ICD-10-CM | POA: Insufficient documentation

## 2015-11-07 NOTE — Progress Notes (Signed)
Patient denies any allergies to eggs or soy. Patient denies any problems with anesthesia/sedation. Patient denies any oxygen use at home and does not take any diet/weight loss medications. EMMI email sent to pt for colonoscopy and instructions given to patient.

## 2015-11-18 ENCOUNTER — Telehealth: Payer: Self-pay | Admitting: Emergency Medicine

## 2015-11-21 ENCOUNTER — Encounter: Payer: Medicare Other | Admitting: Gastroenterology

## 2016-04-09 ENCOUNTER — Ambulatory Visit (INDEPENDENT_AMBULATORY_CARE_PROVIDER_SITE_OTHER): Payer: Medicare Other | Admitting: Internal Medicine

## 2016-04-09 ENCOUNTER — Encounter: Payer: Self-pay | Admitting: Internal Medicine

## 2016-04-09 VITALS — BP 152/90 | HR 80 | Ht 65.0 in | Wt 192.0 lb

## 2016-04-09 DIAGNOSIS — E213 Hyperparathyroidism, unspecified: Secondary | ICD-10-CM | POA: Diagnosis not present

## 2016-04-09 LAB — VITAMIN D 25 HYDROXY (VIT D DEFICIENCY, FRACTURES): VITD: 30.17 ng/mL (ref 30.00–100.00)

## 2016-04-09 NOTE — Patient Instructions (Signed)
Please stop at the lab.  Please return in 1 year.  

## 2016-04-09 NOTE — Progress Notes (Signed)
Patient ID: [name] Le, female   DOB: 01/09/1946, 70 y.o.   MRN: [id number]   HPI  [name] Le is a 70 y.o.-year-old female, returning for f/u for Primary hyperparathyroidism. Last visit 6 mo ago.  Pt was dx with hypercalcemia in 2012. 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 She tells me she decided not to go with the surgery.  Lab Results  Component Value Date   PTH 74 (H) 10/11/2015   CALCIUM 10.7 (H) 10/11/2015   CALCIUM 10.2 12/01/2012   CALCIUM 10.3 04/02/2011   CALCIUM 10.3 04/01/2011   CALCIUM 9.7 03/31/2011   CALCIUM 11.1 (H) 03/26/2011   CALCIUM 10.4 08/24/2008   CALCIUM 10.4 11/18/2007  01/24/2015: calcium 11.9,  GFR 46  UCa at the ULN: Component     Latest Ref Rng 10/17/2015  Calcium, Ur     Not estab mg/dL 17  Calcium, 24 hour urine     35 - 250 mg/24 h 247  Creatinine, Urine     20 - 320 mg/dL 146  Creatinine, 24H Ur     0.63 - 2.50 g/24 h 2.12    Of note, the patient had a Tc sestamibi scan (08/30/2014) that returned negative for a parathyroid adenoma.  No previous available DEXA scans. No fractures or falls.   No h/o kidney stones.  + h/o stage 3 CKD. Last BUN/Cr: Lab Results  Component Value Date   BUN 19 10/11/2015   CREATININE 0.94 (H) 10/11/2015   01/24/2015: BUN/creatinine: 27/1.38, GFR 46  She was on HCTZ >> we stopped this.  No h/o vitamin D deficiency, in 03/2015 vitamin D was: Lab Results  Component Value Date   VD25OH 30.37 10/11/2015   VD25OH 48.75 03/29/2015   Pt is not on calcium or vitamin D supplements.   Pt does not have a FH of hypercalcemia, pituitary tumors, thyroid cancer, or osteoporosis.   She also has a history of  DM2 - controlled, HTN, OA, HL, mild CAD, multinodular goiter s/p FNA 3 on 12/2013 with benign results.  She is not bothered by her goiter: no hoarseness, dysphagia, SOB.  ROS: Constitutional:+ weight gain, no  fatigue,  no Hot flashes, +  Excessive urination, nocturia due to drinking a lot of water Eyes: + blurry vision, no xerophthalmia ENT: no sore throat, + nodules palpated in throat, no dysphagia/odynophagia, no hoarseness Cardiovascular: no CP/SOB/no palpitations/leg swelling Respiratory: no cough/no SOB Gastrointestinal: no N/V/D/C Musculoskeletal:  Muscle aches/no joint aches Skin: + rash arms  Neurological: no tremors/numbness/tingling/dizziness, + HA  I reviewed pt's medications, allergies, PMH, social hx, family hx, and changes were documented in the history of present illness. Otherwise, unchanged from my initial visit note.  Past Medical History:  Diagnosis Date  . Diabetes mellitus without complication ([place])   . High cholesterol   . Hypertension    Past Surgical History:  Procedure Laterality Date  . ABDOMINAL HYSTERECTOMY    . REPLACEMENT TOTAL KNEE Right    History   Social History  . Marital Status: Married    Spouse Name: N/A  . Number of Children: 1   Occupational History  . Retired Geneticist, [employer])    Social History Main Topics  . Smoking status: Never Smoker   . Smokeless tobacco: Never Used  . Alcohol Use: Yes     Comment: ocassionally   . Drug Use: No     Comment: Previously smoked.  Quit in 1981   Current Outpatient Prescriptions on File Prior to Visit  Medication Sig Dispense Refill  . albuterol (PROAIR HFA) 108 (90 BASE) MCG/ACT inhaler Inhale 2 puffs into the lungs every 4 (four) hours as needed for wheezing or shortness of breath. 1 Inhaler 5  . amLODipine (NORVASC) 5 MG tablet Take 5 mg by mouth daily.  5  . aspirin 81 MG tablet Take 81 mg by mouth daily.    . bisacodyl (DULCOLAX) 5 MG EC tablet Take 5 mg by mouth once. Per colon prep instructions-take as directed.    . budesonide-formoterol (SYMBICORT) 160-4.5 MCG/ACT inhaler Inhale 2 puffs into the lungs 2 (two) times daily. 1 Inhaler 5  . furosemide (LASIX) 20 MG tablet Take 20 mg by mouth daily.     . Insulin Pen Needle (NOVOFINE) 32G X 6 MM MISC     . iron polysaccharides (NIFEREX) 150 MG capsule Take 150 mg by mouth daily.    . metFORMIN (GLUCOPHAGE-XR) 500 MG 24 hr tablet Take 500 mg by mouth daily.   1  . NOVOLOG MIX 70/30 FLEXPEN (70-30) 100 UNIT/ML FlexPen Inject 45 Units into the skin daily with breakfast.   5  . ONE TOUCH ULTRA TEST test strip 3 (three) times daily. for testing  3  . polyethylene glycol powder (MIRALAX) powder Take 1 Container by mouth daily. Per colon prep instructions-take as directed.    . pravastatin (PRAVACHOL) 80 MG tablet TAKE 1 TABLET IN THE EVENING DAILY  5  . valsartan (DIOVAN) 320 MG tablet Take 320 mg by mouth daily.  5   No current facility-administered medications on file prior to visit.    Allergies  Allergen Reactions  . Ace Inhibitors Cough   Family History  Problem Relation Age of Onset  . Lung cancer Sister   . Stroke Mother   . Heart disease Sister   . Cancer Father     pts states cancer in back  . Cancer Brother     "  . Colon cancer Neg Hx   Also, diabetes and hypertension in sisters, nephews, nieces  PE: BP (!) 152/90 (BP Location: Left Arm, Patient Position: Sitting)   Pulse 80   Ht 5\' 5"  (1.651 m)   Wt 192 lb (87.1 kg)   BMI 31.95 kg/m  Body mass index is 31.95 kg/m. Wt Readings from Last 3 Encounters:  04/09/16 192 lb (87.1 kg)  11/07/15 190 lb (86.2 kg)  10/11/15 191 lb 9.6 oz (86.9 kg)   Constitutional: overweight, in NAD. No kyphosis. Eyes: PERRLA, EOMI, no exophthalmos ENT: moist mucous membranes, + L>R thyromegaly, no cervical lymphadenopathy Cardiovascular: RRR, No MRG Respiratory: CTA B Gastrointestinal: abdomen soft, NT, ND, BS+ Musculoskeletal: no deformities, strength intact in all 4 Skin: moist, warm, no rashes Neurological: no tremor with outstretched hands, DTR normal in all 4  Assessment: 1. Hypercalcemia/hyperparathyroidism  2. HTN  Plan: 1. Patient has had several instances of elevated  calcium, with the highest level being at 11.9 (a 0.6-10.3). A corresponding intact PTH level was also high,  Per review of records from PCP, however, I do not have this value. A repeat PTH at last visit was high, for a Ca of 10.7. - No apparent complications from hypercalcemia: no h/o nephrolithiasis, no osteoporosis, no fractures. No abdominal pain, depression, bone pain. She had a 24h UCa at the ULN. - at last visit, we also checked vitamin D level >> normal >> we did not start supplements - of  note, She had a normal technetium sestamibi scan, which can give false negative results , but this also brings into question whether her hypercalcemia is due to her CKD. I agree with PCP that the degree of hypercalcemia is higher than expected from secondary hyperparathyroidism. Based on prev. 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 She is aware, but refuses sx >> will continue to follow her by labs - will check vit D and PTH + Ca today - I will see the patient back in 1 year  2. MNG - has several large thyroid nodules >> FNA x3 of the dominant nodules in 2015 >> benign - refuses a new U/S  Office Visit on 04/09/2016  Component Date Value Ref Range Status  . VITD 04/09/2016 30.17  30.00 - 100.00 ng/mL Final  . Calcium 04/10/2016 10.8* 8.7 - 10.3 mg/dL Final  . PTH 04/10/2016 64  15 - 65 pg/mL Final  . PTH 04/10/2016 Comment   Final   Comment: Interpretation                 Intact PTH    Calcium                                 (pg/mL)      (mg/dL) Normal                          15 - 65     8.6 - 10.2 Primary Hyperparathyroidism         >65          >10.2 Secondary Hyperparathyroidism       >65          <10.2 Non-Parathyroid Hypercalcemia       <65          >10.2 Hypoparathyroidism                  <15           < 8.6 Non-Parathyroid Hypocalcemia    15 - 65          < 8.6    Calcium level is slightly high, but stable. PTH is slightly lower than before. Vitamin D level is normal.  Philemon Kingdom, MD PhD [facility]

## 2016-04-10 ENCOUNTER — Telehealth: Payer: Self-pay

## 2016-04-10 LAB — PTH, INTACT AND CALCIUM
Calcium: 10.8 mg/dL — ABNORMAL HIGH (ref 8.7–10.3)
PTH: 64 pg/mL (ref 15–65)

## 2016-04-10 NOTE — Telephone Encounter (Signed)
Attempted to reach patient with lab results. Both numbers had no answer and no voicemail to leave message. Will try again later.

## 2016-04-18 ENCOUNTER — Telehealth: Payer: Self-pay

## 2016-04-18 NOTE — Telephone Encounter (Signed)
Attempted to contact patient, was able to leave voicemail, advised to call back to receive results.

## 2016-05-22 ENCOUNTER — Telehealth: Payer: Self-pay

## 2016-05-22 NOTE — Telephone Encounter (Signed)
Mailed results after not being able to contact patient via telephone.

## 2016-05-25 LAB — GLUCOSE, POCT (MANUAL RESULT ENTRY): POC Glucose: 149 mg/dl — AB (ref 70–99)

## 2016-11-03 IMAGING — CT CT CHEST HIGH RESOLUTION W/O CM
2 of 6 series · 9 of 36 positions shown, 11 images · non-contrast
Comparison: Chest radiographs dating back to 11/30/2005. CT abdomen
pelvis 04/14/2006.

CLINICAL DATA: Shortness of breath, cough for 3 months.
Interstitial lung disease.

EXAM:
CT CHEST WITHOUT CONTRAST
TECHNIQUE: Multidetector CT imaging of the chest was performed following the
standard protocol without intravenous contrast. High resolution
imaging of the lungs, as well as inspiratory and expiratory imaging,
was performed.

[Series 8: hires retro entire lungs · axial · 0.66mm/px · z∈[-282,-92]mm · 6 of 27 slices shown, 8 images]
[im 4/27  mediastinal]
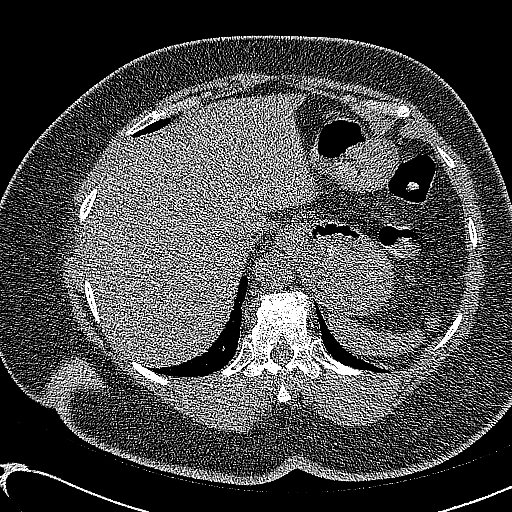
[im 4/27  lung]
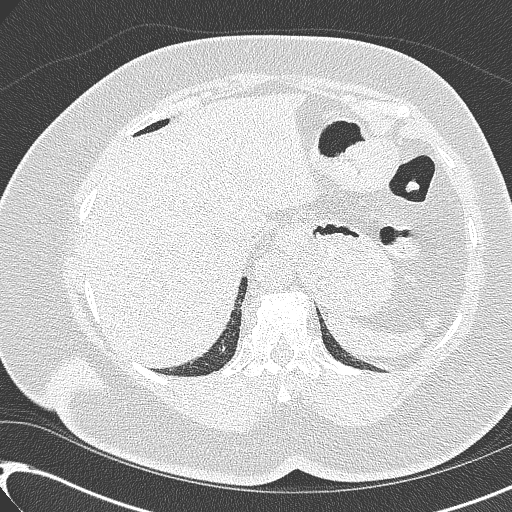
[im 8/27  lung]
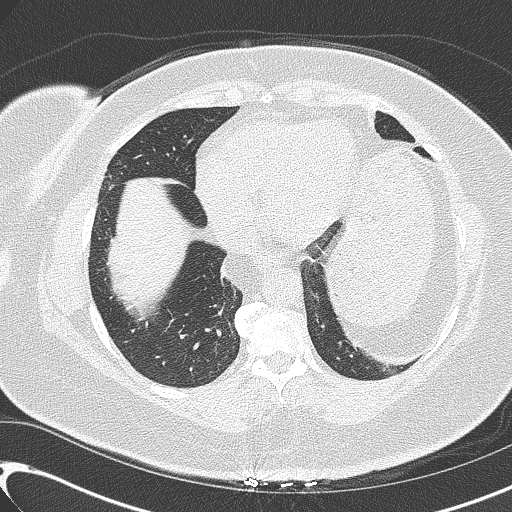
[im 12/27  lung]
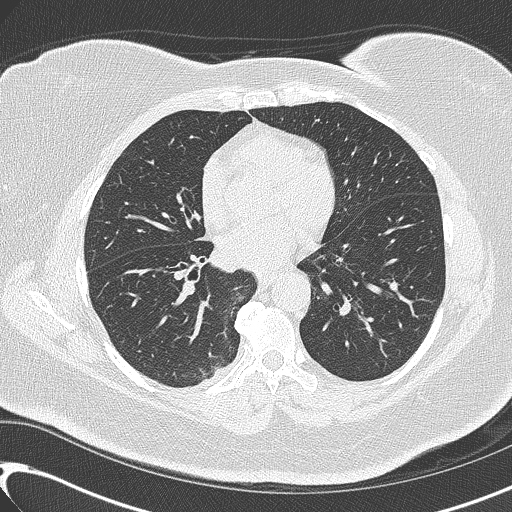
[im 15/27  lung]
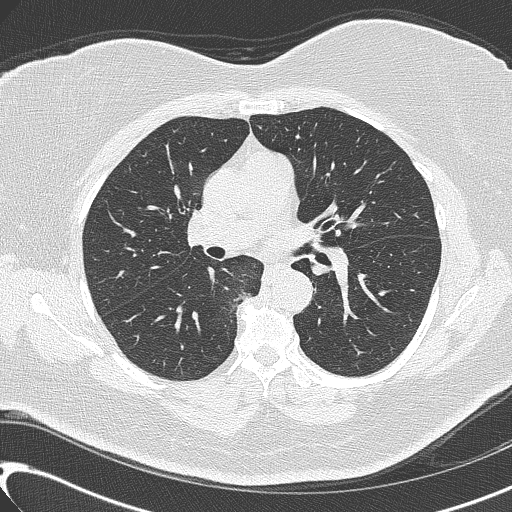
[im 19/27  mediastinal]
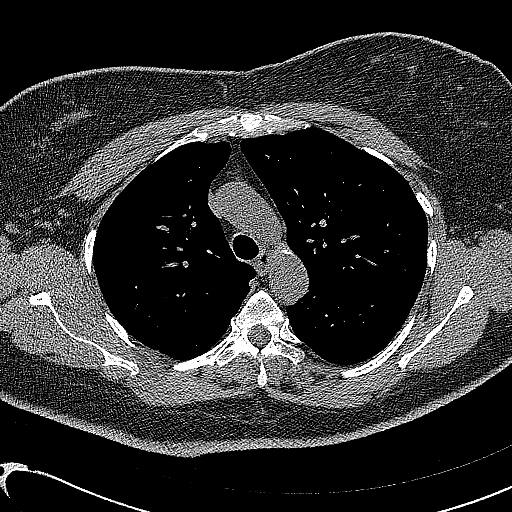
[im 19/27  lung]
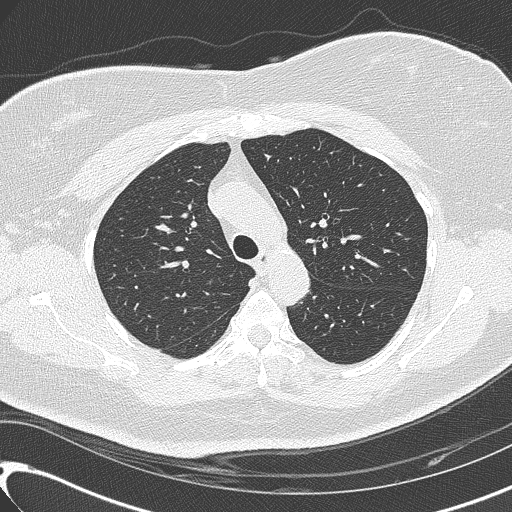
[im 23/27  lung]
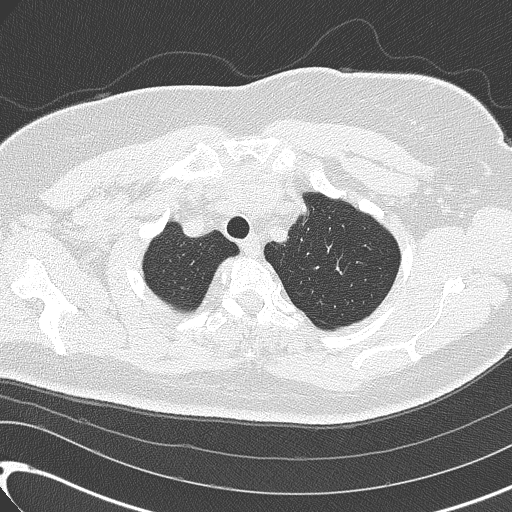

[Series 602: coronals · coronal · 0.66mm/px · 3 of 118 slices shown]
[im 24/118  lung]
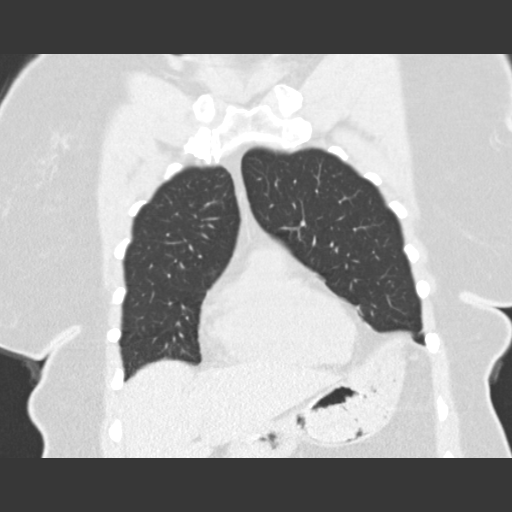
[im 47/118  lung]
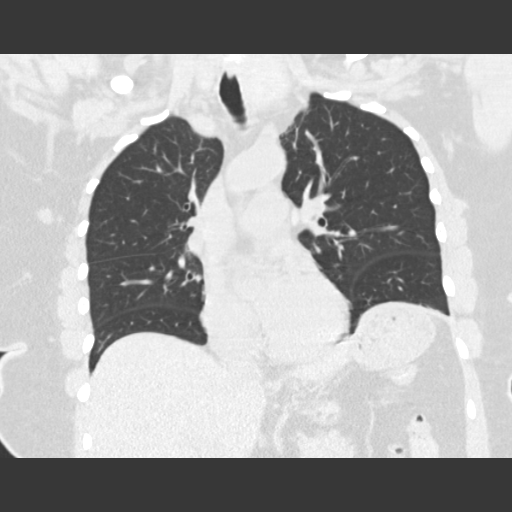
[im 71/118  lung]
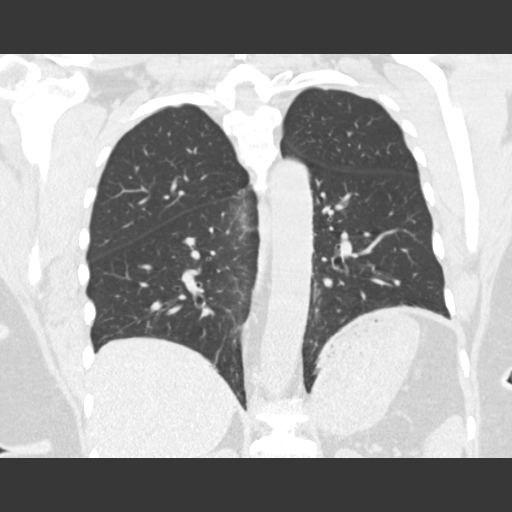

[9 of 36 positions shown; findings below may reference images not displayed]

FINDINGS: Thyroid is enlarged and incompletely imaged. Mediastinal lymph nodes
are not enlarged by CT size criteria. Hilar regions are difficult to
definitively evaluate without IV contrast but appear grossly
unremarkable. No axillary adenopathy. Coronary artery calcification.
Heart size normal. No pericardial effusion.

Image quality is mildly degraded by respiratory motion. No definite
subpleural reticulation, traction bronchiectasis/ bronchiolectasis,
ground-glass, architectural distortion or honeycombing. No air
trapping. No pleural fluid. Airway is unremarkable.

Incidental imaging of the upper abdomen shows low attenuation
lesions in the liver, measuring up to 2.2 cm in the right hepatic
lobe, unchanged from 04/14/2006, indicative of cysts or hemangiomas.
Visualized portions of the right adrenal gland, spleen, pancreas and
stomach are otherwise grossly unremarkable. No upper abdominal
adenopathy.

No worrisome lytic or sclerotic lesions. Degenerative changes are
seen in the spine.
IMPRESSION: 1. No evidence of interstitial lung disease. No findings to explain
the patient's shortness of breath and cough.
2. Enlarged thyroid, incompletely imaged.
3. Coronary artery calcification.

## 2017-02-04 ENCOUNTER — Emergency Department (HOSPITAL_COMMUNITY): Payer: Medicare Other

## 2017-02-04 ENCOUNTER — Observation Stay (HOSPITAL_COMMUNITY)
Admission: EM | Admit: 2017-02-04 | Discharge: 2017-02-06 | Disposition: A | Discharge status: Home / Self Care | Payer: Medicare Other | Attending: Internal Medicine | Admitting: Internal Medicine

## 2017-02-04 ENCOUNTER — Encounter (HOSPITAL_COMMUNITY): Payer: Self-pay | Admitting: Emergency Medicine

## 2017-02-04 DIAGNOSIS — E1165 Type 2 diabetes mellitus with hyperglycemia: Secondary | ICD-10-CM | POA: Diagnosis not present

## 2017-02-04 DIAGNOSIS — E1121 Type 2 diabetes mellitus with diabetic nephropathy: Secondary | ICD-10-CM | POA: Insufficient documentation

## 2017-02-04 DIAGNOSIS — K922 Gastrointestinal hemorrhage, unspecified: Secondary | ICD-10-CM

## 2017-02-04 DIAGNOSIS — Z808 Family history of malignant neoplasm of other organs or systems: Secondary | ICD-10-CM | POA: Insufficient documentation

## 2017-02-04 DIAGNOSIS — I491 Atrial premature depolarization: Secondary | ICD-10-CM | POA: Diagnosis not present

## 2017-02-04 DIAGNOSIS — M25512 Pain in left shoulder: Secondary | ICD-10-CM | POA: Diagnosis present

## 2017-02-04 DIAGNOSIS — Z7982 Long term (current) use of aspirin: Secondary | ICD-10-CM | POA: Diagnosis not present

## 2017-02-04 DIAGNOSIS — M419 Scoliosis, unspecified: Secondary | ICD-10-CM | POA: Insufficient documentation

## 2017-02-04 DIAGNOSIS — E7800 Pure hypercholesterolemia, unspecified: Secondary | ICD-10-CM | POA: Insufficient documentation

## 2017-02-04 DIAGNOSIS — N183 Chronic kidney disease, stage 3 (moderate): Secondary | ICD-10-CM | POA: Diagnosis not present

## 2017-02-04 DIAGNOSIS — E049 Nontoxic goiter, unspecified: Secondary | ICD-10-CM | POA: Diagnosis not present

## 2017-02-04 DIAGNOSIS — I2510 Atherosclerotic heart disease of native coronary artery without angina pectoris: Secondary | ICD-10-CM | POA: Insufficient documentation

## 2017-02-04 DIAGNOSIS — D500 Iron deficiency anemia secondary to blood loss (chronic): Principal | ICD-10-CM

## 2017-02-04 DIAGNOSIS — E876 Hypokalemia: Secondary | ICD-10-CM | POA: Diagnosis not present

## 2017-02-04 DIAGNOSIS — Z96659 Presence of unspecified artificial knee joint: Secondary | ICD-10-CM | POA: Insufficient documentation

## 2017-02-04 DIAGNOSIS — Z90710 Acquired absence of both cervix and uterus: Secondary | ICD-10-CM | POA: Diagnosis not present

## 2017-02-04 DIAGNOSIS — Z801 Family history of malignant neoplasm of trachea, bronchus and lung: Secondary | ICD-10-CM | POA: Insufficient documentation

## 2017-02-04 DIAGNOSIS — E118 Type 2 diabetes mellitus with unspecified complications: Secondary | ICD-10-CM

## 2017-02-04 DIAGNOSIS — Z823 Family history of stroke: Secondary | ICD-10-CM | POA: Insufficient documentation

## 2017-02-04 DIAGNOSIS — D649 Anemia, unspecified: Secondary | ICD-10-CM | POA: Diagnosis present

## 2017-02-04 DIAGNOSIS — D509 Iron deficiency anemia, unspecified: Secondary | ICD-10-CM

## 2017-02-04 DIAGNOSIS — E1122 Type 2 diabetes mellitus with diabetic chronic kidney disease: Secondary | ICD-10-CM | POA: Insufficient documentation

## 2017-02-04 DIAGNOSIS — Z79899 Other long term (current) drug therapy: Secondary | ICD-10-CM | POA: Insufficient documentation

## 2017-02-04 DIAGNOSIS — K222 Esophageal obstruction: Secondary | ICD-10-CM | POA: Insufficient documentation

## 2017-02-04 DIAGNOSIS — E785 Hyperlipidemia, unspecified: Secondary | ICD-10-CM | POA: Diagnosis not present

## 2017-02-04 DIAGNOSIS — J45909 Unspecified asthma, uncomplicated: Secondary | ICD-10-CM | POA: Diagnosis not present

## 2017-02-04 DIAGNOSIS — Z791 Long term (current) use of non-steroidal anti-inflammatories (NSAID): Secondary | ICD-10-CM | POA: Insufficient documentation

## 2017-02-04 DIAGNOSIS — I129 Hypertensive chronic kidney disease with stage 1 through stage 4 chronic kidney disease, or unspecified chronic kidney disease: Secondary | ICD-10-CM | POA: Insufficient documentation

## 2017-02-04 DIAGNOSIS — E784 Other hyperlipidemia: Secondary | ICD-10-CM | POA: Diagnosis not present

## 2017-02-04 DIAGNOSIS — K31819 Angiodysplasia of stomach and duodenum without bleeding: Secondary | ICD-10-CM | POA: Diagnosis not present

## 2017-02-04 DIAGNOSIS — Z794 Long term (current) use of insulin: Secondary | ICD-10-CM | POA: Insufficient documentation

## 2017-02-04 DIAGNOSIS — I10 Essential (primary) hypertension: Secondary | ICD-10-CM | POA: Diagnosis not present

## 2017-02-04 DIAGNOSIS — K5520 Angiodysplasia of colon without hemorrhage: Secondary | ICD-10-CM

## 2017-02-04 DIAGNOSIS — K449 Diaphragmatic hernia without obstruction or gangrene: Secondary | ICD-10-CM | POA: Diagnosis not present

## 2017-02-04 HISTORY — DX: Anemia, unspecified: D64.9

## 2017-02-04 HISTORY — DX: Chronic kidney disease, stage 3 (moderate): N18.3

## 2017-02-04 HISTORY — DX: Nontoxic goiter, unspecified: E04.9

## 2017-02-04 HISTORY — DX: Chronic kidney disease, stage 3 unspecified: N18.30

## 2017-02-04 LAB — CBC WITH DIFFERENTIAL/PLATELET
Basophils Absolute: 0 10*3/uL (ref 0.0–0.1)
Basophils Relative: 0 %
Eosinophils Absolute: 0.1 10*3/uL (ref 0.0–0.7)
Eosinophils Relative: 2 %
HCT: 25.6 % — ABNORMAL LOW (ref 36.0–46.0)
Hemoglobin: 7.5 g/dL — ABNORMAL LOW (ref 12.0–15.0)
Lymphocytes Relative: 31 %
Lymphs Abs: 1.5 10*3/uL (ref 0.7–4.0)
MCH: 22.3 pg — ABNORMAL LOW (ref 26.0–34.0)
MCHC: 29.3 g/dL — ABNORMAL LOW (ref 30.0–36.0)
MCV: 76.2 fL — ABNORMAL LOW (ref 78.0–100.0)
Monocytes Absolute: 0.3 10*3/uL (ref 0.1–1.0)
Monocytes Relative: 7 %
Neutro Abs: 2.9 10*3/uL (ref 1.7–7.7)
Neutrophils Relative %: 60 %
Platelets: 286 10*3/uL (ref 150–400)
RBC: 3.36 MIL/uL — ABNORMAL LOW (ref 3.87–5.11)
RDW: 18.2 % — ABNORMAL HIGH (ref 11.5–15.5)
WBC: 4.8 10*3/uL (ref 4.0–10.5)

## 2017-02-04 LAB — VITAMIN B12: Vitamin B-12: 605 pg/mL (ref 180–914)

## 2017-02-04 LAB — BASIC METABOLIC PANEL
Anion gap: 7 (ref 5–15)
BUN: 18 mg/dL (ref 6–20)
CO2: 24 mmol/L (ref 22–32)
Calcium: 10.5 mg/dL — ABNORMAL HIGH (ref 8.9–10.3)
Chloride: 111 mmol/L (ref 101–111)
Creatinine, Ser: 0.87 mg/dL (ref 0.44–1.00)
GFR calc Af Amer: >60 mL/min (ref >60)
GFR calc non Af Amer: >60 mL/min (ref >60)
Glucose, Bld: 121 mg/dL — ABNORMAL HIGH (ref 65–99)
Potassium: 3.5 mmol/L (ref 3.5–5.1)
Sodium: 142 mmol/L (ref 135–145)

## 2017-02-04 LAB — CBC
HCT: 29.3 % — ABNORMAL LOW (ref 36.0–46.0)
Hemoglobin: 8.5 g/dL — ABNORMAL LOW (ref 12.0–15.0)
MCH: 22 pg — ABNORMAL LOW (ref 26.0–34.0)
MCHC: 29 g/dL — ABNORMAL LOW (ref 30.0–36.0)
MCV: 75.7 fL — ABNORMAL LOW (ref 78.0–100.0)
Platelets: 300 10*3/uL (ref 150–400)
RBC: 3.87 MIL/uL (ref 3.87–5.11)
RDW: 18.1 % — ABNORMAL HIGH (ref 11.5–15.5)
WBC: 4.9 10*3/uL (ref 4.0–10.5)

## 2017-02-04 LAB — IRON AND TIBC
Iron: 20 ug/dL — ABNORMAL LOW (ref 28–170)
Saturation Ratios: 5 % — ABNORMAL LOW (ref 10.4–31.8)
TIBC: 372 ug/dL (ref 250–450)
UIBC: 352 ug/dL

## 2017-02-04 LAB — FERRITIN: Ferritin: 6 ng/mL — ABNORMAL LOW (ref 11–307)

## 2017-02-04 LAB — FOLATE: Folate: 13 ng/mL (ref >5.9)

## 2017-02-04 LAB — CBG MONITORING, ED: Glucose-Capillary: 116 mg/dL — ABNORMAL HIGH (ref 65–99)

## 2017-02-04 LAB — I-STAT TROPONIN, ED: Troponin i, poc: 0.01 ng/mL (ref 0.00–0.08)

## 2017-02-04 LAB — RETICULOCYTES
RBC.: 3.35 MIL/uL — ABNORMAL LOW (ref 3.87–5.11)
Retic Count, Absolute: 103.9 10*3/uL (ref 19.0–186.0)
Retic Ct Pct: 3.1 % (ref 0.4–3.1)

## 2017-02-04 LAB — GLUCOSE, CAPILLARY
Glucose-Capillary: 108 mg/dL — ABNORMAL HIGH (ref 65–99)
Glucose-Capillary: 174 mg/dL — ABNORMAL HIGH (ref 65–99)
Glucose-Capillary: 192 mg/dL — ABNORMAL HIGH (ref 65–99)

## 2017-02-04 LAB — POC OCCULT BLOOD, ED: Fecal Occult Bld: NEGATIVE

## 2017-02-04 LAB — PREPARE RBC (CROSSMATCH)

## 2017-02-04 MED ORDER — INSULIN ASPART 100 UNIT/ML ~~LOC~~ SOLN: 0.0000 [IU] | Freq: Every day | SUBCUTANEOUS | Status: DC

## 2017-02-04 MED ORDER — PRAVASTATIN SODIUM 40 MG PO TABS
80.0000 mg | ORAL_TABLET | Freq: Every day | ORAL | Status: DC
  Administered 2017-02-04 – 2017-02-06 (×3): 80 mg via ORAL
  Filled 2017-02-04 (×3): qty 2

## 2017-02-04 MED ORDER — METHOCARBAMOL 1000 MG/10ML IJ SOLN
500.0000 mg | Freq: Three times a day (TID) | INTRAVENOUS | Status: DC | PRN
  Filled 2017-02-04: qty 5

## 2017-02-04 MED ORDER — METOCLOPRAMIDE HCL 5 MG/ML IJ SOLN
10.0000 mg | Freq: Once | INTRAMUSCULAR | Status: AC
  Administered 2017-02-04: 10 mg via INTRAVENOUS
  Filled 2017-02-04: qty 2

## 2017-02-04 MED ORDER — DICLOFENAC SODIUM 1 % TD GEL
4.0000 g | Freq: Four times a day (QID) | TRANSDERMAL | Status: DC | PRN
Start: 2017-02-04 — End: 2017-02-04

## 2017-02-04 MED ORDER — POLYSACCHARIDE IRON COMPLEX 150 MG PO CAPS: 150.0000 mg | ORAL_CAPSULE | Freq: Every day | ORAL | Status: DC

## 2017-02-04 MED ORDER — INSULIN ASPART PROT & ASPART (70-30 MIX) 100 UNIT/ML ~~LOC~~ SUSP
24.0000 [IU] | Freq: Two times a day (BID) | SUBCUTANEOUS | Status: DC
  Administered 2017-02-04 – 2017-02-06 (×4): 24 [IU] via SUBCUTANEOUS
  Filled 2017-02-04: qty 10

## 2017-02-04 MED ORDER — KETOROLAC TROMETHAMINE 15 MG/ML IJ SOLN
15.0000 mg | Freq: Once | INTRAMUSCULAR | Status: AC
  Administered 2017-02-04: 15 mg via INTRAVENOUS
  Filled 2017-02-04: qty 1

## 2017-02-04 MED ORDER — AMLODIPINE BESYLATE 5 MG PO TABS
5.0000 mg | ORAL_TABLET | Freq: Every day | ORAL | Status: DC
  Administered 2017-02-04 – 2017-02-06 (×3): 5 mg via ORAL
  Filled 2017-02-04 (×3): qty 1

## 2017-02-04 MED ORDER — IRBESARTAN 300 MG PO TABS
300.0000 mg | ORAL_TABLET | Freq: Every day | ORAL | Status: DC
  Administered 2017-02-04 – 2017-02-06 (×3): 300 mg via ORAL
  Filled 2017-02-04 (×3): qty 1

## 2017-02-04 MED ORDER — ACETAMINOPHEN 325 MG PO TABS
650.0000 mg | ORAL_TABLET | Freq: Four times a day (QID) | ORAL | Status: DC | PRN
  Administered 2017-02-04 – 2017-02-06 (×2): 650 mg via ORAL
  Filled 2017-02-04 (×2): qty 2

## 2017-02-04 MED ORDER — POLYETHYLENE GLYCOL 3350 17 GM/SCOOP PO POWD: 1.0000 | Freq: Every day | ORAL | Status: DC

## 2017-02-04 MED ORDER — HYDRALAZINE HCL 20 MG/ML IJ SOLN
5.0000 mg | INTRAMUSCULAR | Status: DC | PRN
  Administered 2017-02-04 – 2017-02-05 (×4): 10 mg via INTRAVENOUS
  Filled 2017-02-04 (×4): qty 1

## 2017-02-04 MED ORDER — SODIUM CHLORIDE 0.9 % IV SOLN
10.0000 mL/h | Freq: Once | INTRAVENOUS | Status: AC
  Administered 2017-02-05: 10 mL/h via INTRAVENOUS

## 2017-02-04 MED ORDER — ONDANSETRON HCL 4 MG/2ML IJ SOLN: 4.0000 mg | Freq: Four times a day (QID) | INTRAMUSCULAR | Status: DC | PRN

## 2017-02-04 MED ORDER — PANTOPRAZOLE SODIUM 40 MG IV SOLR
40.0000 mg | Freq: Two times a day (BID) | INTRAVENOUS | Status: DC
  Administered 2017-02-04 – 2017-02-05 (×3): 40 mg via INTRAVENOUS
  Filled 2017-02-04 (×3): qty 40

## 2017-02-04 MED ORDER — PEG-KCL-NACL-NASULF-NA ASC-C 100 G PO SOLR
0.5000 | Freq: Once | ORAL | Status: AC
  Administered 2017-02-05: 100 g via ORAL

## 2017-02-04 MED ORDER — PANTOPRAZOLE SODIUM 40 MG IV SOLR
40.0000 mg | Freq: Once | INTRAVENOUS | Status: AC
  Administered 2017-02-04: 40 mg via INTRAVENOUS
  Filled 2017-02-04: qty 40

## 2017-02-04 MED ORDER — ACETAMINOPHEN 650 MG RE SUPP: 650.0000 mg | Freq: Four times a day (QID) | RECTAL | Status: DC | PRN

## 2017-02-04 MED ORDER — ONDANSETRON HCL 4 MG PO TABS: 4.0000 mg | ORAL_TABLET | Freq: Four times a day (QID) | ORAL | Status: DC | PRN

## 2017-02-04 MED ORDER — ASPIRIN 81 MG PO CHEW
81.0000 mg | CHEWABLE_TABLET | Freq: Every day | ORAL | Status: DC
  Administered 2017-02-04 – 2017-02-06 (×3): 81 mg via ORAL
  Filled 2017-02-04 (×3): qty 1

## 2017-02-04 MED ORDER — INSULIN ASPART 100 UNIT/ML ~~LOC~~ SOLN
0.0000 [IU] | Freq: Three times a day (TID) | SUBCUTANEOUS | Status: DC
  Administered 2017-02-04: 2 [IU] via SUBCUTANEOUS
  Administered 2017-02-05: 3 [IU] via SUBCUTANEOUS
  Administered 2017-02-05 – 2017-02-06 (×3): 1 [IU] via SUBCUTANEOUS

## 2017-02-04 MED ORDER — OXYCODONE-ACETAMINOPHEN 5-325 MG PO TABS
1.0000 | ORAL_TABLET | Freq: Once | ORAL | Status: AC
  Administered 2017-02-04: 1 via ORAL
  Filled 2017-02-04: qty 1

## 2017-02-04 MED ORDER — DIPHENHYDRAMINE HCL 50 MG/ML IJ SOLN
25.0000 mg | Freq: Once | INTRAMUSCULAR | Status: AC
  Administered 2017-02-04: 25 mg via INTRAVENOUS
  Filled 2017-02-04: qty 1

## 2017-02-04 MED ORDER — MOMETASONE FURO-FORMOTEROL FUM 200-5 MCG/ACT IN AERO
2.0000 | INHALATION_SPRAY | Freq: Two times a day (BID) | RESPIRATORY_TRACT | Status: DC
  Administered 2017-02-04 – 2017-02-06 (×4): 2 via RESPIRATORY_TRACT
  Filled 2017-02-04: qty 8.8

## 2017-02-04 MED ORDER — PEG-KCL-NACL-NASULF-NA ASC-C 100 G PO SOLR
0.5000 | Freq: Once | ORAL | Status: AC
  Administered 2017-02-04: 100 g via ORAL
  Filled 2017-02-04: qty 1

## 2017-02-04 NOTE — ED Notes (Signed)
Pt. Transported to xray will obtain EKG when pt. Returns.

## 2017-02-04 NOTE — ED Notes (Signed)
Attempted report x1. 

## 2017-02-04 NOTE — ED Notes (Signed)
Bed assignment changed.  Attempted report to 6 N x1

## 2017-02-04 NOTE — Progress Notes (Signed)
PT Cancellation Note  Patient Details Name: [name] Le MRN: [id number] DOB: [birth year]   Cancelled Treatment:    Reason Eval/Treat Not Completed: Fatigue/lethargy limiting ability to participate. Asleep, has been getting up to BR per spouse. Plans are for blood and  Procedures tomorrow. Eval  To follow .    Claretha Cooper 02/04/2017, 2:58 PM Tresa Endo PT [phone]

## 2017-02-04 NOTE — ED Provider Notes (Signed)
[facility] DEPT Provider Note   CSN: [id number] Arrival date & time: 02/04/17  0808     History   Chief Complaint Chief Complaint  Patient presents with  . Hyperglycemia  . Back Pain    HPI [name] Le is a 71 y.o. female.  The history is provided by the patient and medical records.  Hyperglycemia  Associated symptoms: chest pain   Back Pain   Associated symptoms include chest pain.    71 year old female with history of diabetes, hyperlipidemia, hypertension, chronic kidney disease stage III, hyperparathyroidism, asthma, presenting to the ED with concern of her blood sugar as well as some left arm pain.  Patient states in the past 2 weeks her blood sugar has been somewhat "sporadic". States she has spells where he goes really high and then spells work was really low. States she has not had any changes to her insulin or dosing.   States she has changed her diet, she has been eating mostly fruit recently.  She's not had any change in urination or excessive thirst.  Has had some lightheadedness but no syncopal events.  Patient also reports some left arm pain. States it starts in the left shoulder and radiates down her entire arm. Pain is worse with movement. She denies any injury or trauma. States she does usually sleep on her left side, unsure if pain is from her with sleeping. Patient has noticed some pain radiating into the left side of her chest over the past 2 weeks as well. States it circles around her breast, this is also worse with movement. She has not noticed any shortness of breath, diaphoresis, nausea, or vomiting.  States she went for stress test with a cardiologist about 10 years ago and was given medicine to her which cause her to "pass out". States she refused to go back for any further testing. She has no known history of CAD or stands. She is not a smoker.  States she has been taking NTG and goody powders for her pain with good relief.  Past Medical History:    Diagnosis Date  . Diabetes mellitus without complication ([place])   . High cholesterol   . Hypertension     Patient Active Problem List   Diagnosis Date Noted  . Chronic kidney disease, stage II (mild) 11/07/2015  . Chronic kidney disease, stage III (moderate) 11/07/2015  . 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 Hyperlipidemia 11/07/2015  . Type 2 diabetes mellitus with diabetic nephropathy ([place]) 11/07/2015  . Hypertensive chronic kidney disease with stage 1 through stage 4 chronic kidney disease, or unspecified chronic kidney disease 11/07/2015  . Hyperparathyroidism ([place]) 10/14/2015  . Essential hypertension 10/11/2015  . Hypercalcemia 03/29/2015  . Intrinsic asthma 07/27/2014  . Chronic cough 07/06/2014    Past Surgical History:  Procedure Laterality Date  . ABDOMINAL HYSTERECTOMY    . REPLACEMENT TOTAL KNEE Right     OB History    No data available       Home Medications    Prior to Admission medications   Medication Sig Start Date End Date Taking? Authorizing Provider  albuterol (PROAIR HFA) 108 (90 BASE) MCG/ACT inhaler Inhale 2 puffs into the lungs every 4 (four) hours as needed for wheezing or shortness of breath. 07/15/15   Parrett, Fonnie Mu, NP  amLODipine (NORVASC) 5 MG tablet Take 5 mg by mouth daily. 10/02/15   [provider]  aspirin 81 MG tablet Take 81 mg by mouth daily.    [provider]  bisacodyl (DULCOLAX) 5 MG EC tablet Take 5 mg by mouth once. Per colon prep instructions-take as directed.    [provider]  budesonide-formoterol (SYMBICORT) 160-4.5 MCG/ACT inhaler Inhale 2 puffs into the lungs 2 (two) times daily. 07/15/15   Parrett, Fonnie Mu, NP  furosemide (LASIX) 20 MG tablet Take 20 mg by mouth daily.    [provider]  Insulin Pen Needle (NOVOFINE) 32G X 6 MM MISC  08/05/13   [provider]  iron polysaccharides (NIFEREX) 150 MG capsule Take 150 mg by mouth daily.    [provider]  metFORMIN  (GLUCOPHAGE-XR) 500 MG 24 hr tablet Take 500 mg by mouth daily.  05/15/14   [provider]  NOVOLOG MIX 70/30 FLEXPEN (70-30) 100 UNIT/ML FlexPen Inject 45 Units into the skin daily with breakfast.  11/02/14   [provider]  ONE TOUCH ULTRA TEST test strip 3 (three) times daily. for testing 01/21/15   [provider]  polyethylene glycol powder (MIRALAX) powder Take 1 Container by mouth daily. Per colon prep instructions-take as directed.    [provider]  pravastatin (PRAVACHOL) 80 MG tablet TAKE 1 TABLET IN THE EVENING DAILY 02/28/15   [provider]  valsartan (DIOVAN) 320 MG tablet Take 320 mg by mouth daily. 10/02/15   [provider]    Family History Family History  Problem Relation Age of Onset  . Stroke Mother   . Cancer Father        pts states cancer in back  . Lung cancer Sister   . Heart disease Sister   . Cancer Brother        "  . Colon cancer Neg Hx     Social History Social History  Substance Use Topics  . Smoking status: Never Smoker  . Smokeless tobacco: Never Used  . Alcohol use Yes     Comment: wine occasional per pt.      Allergies   Ace inhibitors   Review of Systems Review of Systems  Cardiovascular: Positive for chest pain.  Musculoskeletal: Positive for arthralgias.  All other systems reviewed and are negative.    Physical Exam Updated Vital Signs BP (!) 171/87 (BP Location: Right Arm)   Pulse 70   Temp 98.5 F (36.9 C) (Oral)   Resp 14   Ht 5\' 6"  (1.676 m)   Wt 85.7 kg (189 lb)   SpO2 98%   BMI 30.51 kg/m   Physical Exam  Constitutional: She is oriented to person, place, and time. She appears well-developed and well-nourished.  HENT:  Head: Normocephalic and atraumatic.  Mouth/Throat: Oropharynx is clear and moist.  Eyes: Conjunctivae and EOM are normal. Pupils are equal, round, and reactive to light.  Neck: Normal range of motion.  Cardiovascular: Normal rate, regular  rhythm and normal heart sounds.   Pulmonary/Chest: Effort normal and breath sounds normal. No respiratory distress.  Mild tenderness of left chest wall  Abdominal: Soft. Bowel sounds are normal. There is no tenderness. There is no rebound.  Genitourinary:  Genitourinary Comments: Normal rectal tone, does have moderate sized nonthrombosed, nonbleeding external hemorrhoid, light brown stool noted on glove, no gross blood or melena  Musculoskeletal: Normal range of motion.  Tenderness of left shoulder, worse along inferior aspect; pain reproducible with movement of left arm above the head; no pain with ROM of the elbow, wrist, or fingers  Neurological: She is alert and oriented to person, place, and time.  Skin: Skin is warm and  dry.  Psychiatric: She has a normal mood and affect.  Nursing note and vitals reviewed.    ED Treatments / Results  Labs (all labs ordered are listed, but only abnormal results are displayed) Labs Reviewed  CBC WITH DIFFERENTIAL/PLATELET - Abnormal; Notable for the following:       Result Value   RBC 3.36 (*)    Hemoglobin 7.5 (*)    HCT 25.6 (*)    MCV 76.2 (*)    MCH 22.3 (*)    MCHC 29.3 (*)    RDW 18.2 (*)    All other components within normal limits  BASIC METABOLIC PANEL - Abnormal; Notable for the following:    Glucose, Bld 121 (*)    Calcium 10.5 (*)    All other components within normal limits  RETICULOCYTES - Abnormal; Notable for the following:    RBC. 3.35 (*)    All other components within normal limits  CBG MONITORING, ED - Abnormal; Notable for the following:    Glucose-Capillary 116 (*)    All other components within normal limits  VITAMIN B12  FOLATE  IRON AND TIBC  FERRITIN  I-STAT TROPOININ, ED  POC OCCULT BLOOD, ED  TYPE AND SCREEN  PREPARE RBC (CROSSMATCH)    EKG  EKG Interpretation  Date/Time:  Monday Feb 04 2017 09:37:21 EDT Ventricular Rate:  66 PR Interval:    QRS Duration: 102 QT Interval:  384 QTC  Calculation: 403 R Axis:   68 Text Interpretation:  Sinus rhythm Atrial premature complex Baseline wander in lead(s) V2 V6 Normal sinus rhythm Confirmed by Thomasene Lot, Rowena [phone]) on 02/04/2017 9:42:27 AM       Radiology Dg Chest 2 View  Result Date: 02/04/2017 CLINICAL DATA:  Left shoulder pain and chest pain. EXAM: CHEST  2 VIEW COMPARISON:  07/15/2015 FINDINGS: Mild cardiac enlargement. Mediastinal contours are within normal limits. Both lungs are clear. Scoliosis and degenerative disc disease is noted within the thoracic spine. IMPRESSION: No active cardiopulmonary disease. Electronically Signed   By: Kerby Moors M.D.   On: 02/04/2017 09:35   Dg Shoulder Left  Result Date: 02/04/2017 CLINICAL DATA:  Left shoulder pain common no known injury, initial encounter EXAM: LEFT SHOULDER - 2+ VIEW COMPARISON:  None. FINDINGS: There is no evidence of fracture or dislocation. There is no evidence of arthropathy or other focal bone abnormality. Soft tissues are unremarkable. IMPRESSION: No acute abnormality noted. Electronically Signed   By: Inez Catalina M.D.   On: 02/04/2017 09:35    Procedures Procedures (including critical care time)  CRITICAL CARE Performed by: Larene Pickett   Total critical care time: 40 minutes  Critical care time was exclusive of separately billable procedures and treating other patients.  Critical care was necessary to treat or prevent imminent or life-threatening deterioration.  Critical care was time spent personally by me on the following activities: development of treatment plan with patient and/or surrogate as well as nursing, discussions with consultants, evaluation of patient's response to treatment, examination of patient, obtaining history from patient or surrogate, ordering and performing treatments and interventions, ordering and review of laboratory studies, ordering and review of radiographic studies, pulse oximetry and re-evaluation of patient's  condition.   Medications Ordered in ED Medications  oxyCODONE-acetaminophen (PERCOCET/ROXICET) 5-325 MG per tablet 1 tablet (1 tablet Oral Given 02/04/17 0908)     Initial Impression / Assessment and Plan / ED Course  I have reviewed the triage vital signs and the nursing notes.  Pertinent  labs & imaging results that were available during my care of the patient were reviewed by me and considered in my medical decision making (see chart for details).  71 year old female here with hyperglycemia and left arm pain. Reports she's been having issues with this over the past 2 weeks.  States sugar has been going up and down.  Has been eating a lot of fruits recently.  No excessive thirst or urination.  Blood sugar here has been normal.  No signs of DKA-- anion gap and bicarb normal.  Also reports some left arm pain.  No recent injury or trauma.  This is somewhat positional and reproducible with movement on exam.  Reports some left-sided chest pain, this is also positional. No associated symptoms of shortness of breath, diaphoresis, nausea, vomiting. She has no known cardiac history. EKG here is nonischemic, troponin negative.   Patient does have a significant drop in her hemoglobin on labs today (7.5 today from 11.8 in 2014).  Patient does report some black stools at home and some lightheadedness. She has been taking Goody powder for her left shoulder pain. She is not currently on anticoagulation, ASA only.  Hemoccult is negative.  Patient has been taking her iron supplements as directed.   Anemia panel sent.  Discussed with hospitalist, Dr. Marily Memos-- will admit for observation given she is sympatomatic.  I have ordered 2 units of PRBC's for her, will start transfusion here.  Discussed with GI, Dr. Havery Moros-- will see in consult, requested dose of protonix here which was ordered.  Final Clinical Impressions(s) / ED Diagnoses   Final diagnoses:  Symptomatic anemia  Acute pain of left shoulder     New Prescriptions New Prescriptions   No medications on file     Larene Pickett, PA-C 02/04/17 1157    Macarthur Critchley, MD 02/05/17 1121

## 2017-02-04 NOTE — ED Notes (Signed)
Pt ambulated to restroom, no issues. Pt placed back on monitor. 

## 2017-02-04 NOTE — H&P (Signed)
History and Physical    [name] Le:[id number] DOB: [birth year] DOA: 02/04/2017  PCP: Harlan Stains, MD Patient coming from: home  Chief Complaint: L arm pain and high sugar  HPI: [name] Le is a 71 y.o. female with medical history significant of diabetes, hypertension, hyperlipidemia. Patient presenting with a complaint of high sugar and left arm pain. Patient reports having difficulty controlling her sugars are in the last 2 weeks. Glucose ranging from 49 to the 400s over the last 2 weeks. Denies any changes to her insulin regimen recently. Denies any polydipsia or polyuria, fevers, tremor, myalgias, shortness breath, palpitations, abdominal pain, nausea, vomiting.  Patient endorses left arm pain which started several months ago with worsening over the last 2 weeks. Pain is worse with movement. Occasionally radiates up the arm towards the left upper chest near the breast. Patient using Goody's and nitroglycerin for this pain which seems to work. Denies any shortness of breath, palpitations, true chest pain associated with the symptoms. Denies any dyspnea on exertion. Patient reports a normal cardiac catheterization report approximately 10 years ago. Patient has not followed up with cardiology since.  Patient also endorsing dark to black stools over the last 2 weeks. Endorses compliance with diet regimen without variation. Uses Goody powder on a regular basis for muscle skeletal pain. Becoming more fatigued and weak over this period of time. States that last colonoscopy was "A long time ago." Denies any previous EGD.    ED Course: Objective findings outlined below. Percocet given. IV Protonix given. GI consult  Review of Systems: As per HPI otherwise all other systems reviewed and are negative  Ambulatory Status: no restrictions  Past Medical History:  Diagnosis Date  . Diabetes mellitus without complication ([place])   . High cholesterol   . Hypertension     Past Surgical  History:  Procedure Laterality Date  . ABDOMINAL HYSTERECTOMY    . REPLACEMENT TOTAL KNEE Right     Social History   Social History  . Marital status: Married    Spouse name: N/A  . Number of children: N/A  . Years of education: N/A   Occupational History  . retired    Social History Main Topics  . Smoking status: Never Smoker  . Smokeless tobacco: Never Used  . Alcohol use Yes     Comment: wine occasional per pt.   . Drug use: No     Comment: Previously smoked. Quit in 1981  . Sexual activity: Not on file   Other Topics Concern  . Not on file   Social History Narrative  . No narrative on file    Allergies  Allergen Reactions  . Ace Inhibitors Cough    Family History  Problem Relation Age of Onset  . Stroke Mother   . Cancer Father        pts states cancer in back  . Lung cancer Sister   . Heart disease Sister   . Cancer Brother        "  . Colon cancer Neg Hx     Prior to Admission medications   Medication Sig Start Date End Date Taking? Authorizing Provider  albuterol (PROAIR HFA) 108 (90 BASE) MCG/ACT inhaler Inhale 2 puffs into the lungs every 4 (four) hours as needed for wheezing or shortness of breath. 07/15/15   Parrett, Fonnie Mu, NP  amLODipine (NORVASC) 10 MG tablet Take 10 mg by mouth daily. 01/31/17   [provider]  amLODipine (NORVASC) 5 MG tablet  Take 5 mg by mouth daily. 10/02/15   [provider]  aspirin 81 MG tablet Take 81 mg by mouth daily.    [provider]  bisacodyl (DULCOLAX) 5 MG EC tablet Take 5 mg by mouth once. Per colon prep instructions-take as directed.    [provider]  budesonide-formoterol (SYMBICORT) 160-4.5 MCG/ACT inhaler Inhale 2 puffs into the lungs 2 (two) times daily. 07/15/15   Parrett, Fonnie Mu, NP  furosemide (LASIX) 20 MG tablet Take 20 mg by mouth daily.    [provider]  iron polysaccharides (NIFEREX) 150 MG capsule Take 150 mg by mouth daily.    [provider]  metFORMIN (GLUCOPHAGE-XR) 500 MG 24 hr tablet Take 500 mg by mouth daily.  05/15/14   [provider]  NOVOLOG MIX 70/30 FLEXPEN (70-30) 100 UNIT/ML FlexPen Inject 45 Units into the skin daily with breakfast.  11/02/14   [provider]  polyethylene glycol powder (MIRALAX) powder Take 1 Container by mouth daily. Per colon prep instructions-take as directed.    [provider]  pravastatin (PRAVACHOL) 80 MG tablet TAKE 1 TABLET IN THE EVENING DAILY 02/28/15   [provider]  valsartan (DIOVAN) 320 MG tablet Take 320 mg by mouth daily. 10/02/15   [provider]    Physical Exam: Vitals:   02/04/17 0845 02/04/17 1030 02/04/17 1115 02/04/17 1200  BP: (!) 169/82 (!) 191/78 (!) 187/77 (!) 192/87  Pulse: 71 63 65 67  Resp: 10 16 12  (!) 9  Temp:      TempSrc:      SpO2: 99% 97% 94% 99%  Weight:      Height:         General:  Appears calm and comfortable Eyes:  PERRL, EOMI, normal lids, iris ENT:  grossly normal hearing, lips & tongue, mmm Neck:  no LAD, masses or thyromegaly Cardiovascular:  RRR, no m/r/g. No LE edema.  Respiratory:  CTA bilaterally, no w/r/r. Normal respiratory effort. Abdomen:  soft, ntnd, NABS Skin:  no rash or induration seen on limited exam Musculoskeletal:  grossly normal tone BUE/BLE, good ROM, no bony abnormality Psychiatric:  grossly normal mood and affect, speech fluent and appropriate, AOx3 Neurologic:  CN 2-12 grossly intact, moves all extremities in coordinated fashion, sensation intact  Labs on Admission: I have personally reviewed following labs and imaging studies  CBC:  Recent Labs Lab 02/04/17 0858  WBC 4.8  NEUTROABS 2.9  HGB 7.5*  HCT 25.6*  MCV 76.2*  PLT 595   Basic Metabolic Panel:  Recent Labs Lab 02/04/17 0858  NA 142  K 3.5  CL 111  CO2 24  GLUCOSE 121*  BUN 18  CREATININE 0.87  CALCIUM 10.5*   GFR: Estimated Creatinine Clearance: 64.5 mL/min (by C-G formula based on SCr of  0.87 mg/dL). Liver Function Tests: No results for input(s): AST, ALT, ALKPHOS, BILITOT, PROT, ALBUMIN in the last 168 hours. No results for input(s): LIPASE, AMYLASE in the last 168 hours. No results for input(s): AMMONIA in the last 168 hours. Coagulation Profile: No results for input(s): INR, PROTIME in the last 168 hours. Cardiac Enzymes: No results for input(s): CKTOTAL, CKMB, CKMBINDEX, TROPONINI in the last 168 hours. BNP (last 3 results) No results for input(s): PROBNP in the last 8760 hours. HbA1C: No results for input(s): HGBA1C in the last 72 hours. CBG:  Recent Labs Lab 02/04/17 0825  GLUCAP 116*   Lipid Profile: No results for input(s): CHOL, HDL, LDLCALC, TRIG,  CHOLHDL, LDLDIRECT in the last 72 hours. Thyroid Function Tests: No results for input(s): TSH, T4TOTAL, FREET4, T3FREE, THYROIDAB in the last 72 hours. Anemia Panel:  Recent Labs  02/04/17 1034  RETICCTPCT 3.1   Urine analysis:    Component Value Date/Time   COLORURINE YELLOW 03/26/2011 1530   APPEARANCEUR HAZY (A) 03/26/2011 1530   LABSPEC 1.029 03/26/2011 1530   PHURINE 5.0 03/26/2011 1530   GLUCOSEU NEGATIVE 03/26/2011 1530   HGBUR SMALL (A) 03/26/2011 1530   BILIRUBINUR NEGATIVE 03/26/2011 1530   KETONESUR NEGATIVE 03/26/2011 1530   PROTEINUR NEGATIVE 03/26/2011 1530   UROBILINOGEN 0.2 03/26/2011 1530   NITRITE NEGATIVE 03/26/2011 1530   LEUKOCYTESUR NEGATIVE 03/26/2011 1530    Creatinine Clearance: Estimated Creatinine Clearance: 64.5 mL/min (by C-G formula based on SCr of 0.87 mg/dL).  Sepsis Labs: @LABRCNTIP (procalcitonin:4,lacticidven:4) )No results found for this or any previous visit (from the past 240 hour(s)).   Radiological Exams on Admission: Dg Chest 2 View  Result Date: 02/04/2017 CLINICAL DATA:  Left shoulder pain and chest pain. EXAM: CHEST  2 VIEW COMPARISON:  07/15/2015 FINDINGS: Mild cardiac enlargement. Mediastinal contours are within normal limits. Both lungs are  clear. Scoliosis and degenerative disc disease is noted within the thoracic spine. IMPRESSION: No active cardiopulmonary disease. Electronically Signed   By: Kerby Moors M.D.   On: 02/04/2017 09:35   Dg Shoulder Left  Result Date: 02/04/2017 CLINICAL DATA:  Left shoulder pain common no known injury, initial encounter EXAM: LEFT SHOULDER - 2+ VIEW COMPARISON:  None. FINDINGS: There is no evidence of fracture or dislocation. There is no evidence of arthropathy or other focal bone abnormality. Soft tissues are unremarkable. IMPRESSION: No acute abnormality noted. Electronically Signed   By: Inez Catalina M.D.   On: 02/04/2017 09:35    EKG: Independently reviewed. Sinus. No ACS    Assessment/Plan Active Problems:   Essential hypertension   Hyperlipidemia   Symptomatic anemia   GI bleed   Left shoulder pain   Diabetes mellitus with complication ([facility])   Symptomatic anemia / GI bleed: Likely multifactorial secondary to chronic iron deficiency anemia in the setting of GI bleed. Hemoglobin 7.5. Difficult to determine baseline due to infrequent testing but approximately 9-11. Microcytic with an MCV of 76.2. Compliant on home iron. FOBT negative. Unsure of last colonoscopy. No history of upper endoscopy. Frequent NSAID use. Endorsing Dark tarry stools for 2 wks.  - Formal GI consult per EDP - [facility] - outpt vs inpt workup and scope - 2 Units PRBC - Anemia panel - Likely to need IV iron but will wait for studies to return - Protonix BID  Diabetes: Patient endorses recent difficulty with controlling glucose levels with ranges from the high 40s to 400s. No change in regimen. Patient states that it is time for her to see her primary care doctor for help with her diabetes. Current glucose 121 with no significant metabolic derangement. - continue 70/30. Will divide dose to BID insteat of Qday - SSI - A1c  Left shoulder pain: Chronic and intermittent likely secondary to arthritis. Plain film as  above. EKG and troponin reassuring that this is not reactive nature. - Voltaren gel, Robaxin, PT.  HTN: elevated likely due to not taking oral meds on day iof admission - Continue Norvasc, Avapro  CAD: - Continue statin, ASA  Asthma: - Continue to dulera,  albuterol   DVT prophylaxis: SCD  Code Status: full  Family Communication: husband  Disposition Plan: pending eval by Gi and PRBC  Consults called: [facility]  Admission status: Observation    Tatia Petrucci J MD [facility]  If 7PM-7AM, please contact night-coverage [email or web address] Password [facility]  02/04/2017, 12:21 PM

## 2017-02-04 NOTE — Consult Note (Signed)
Consultation  Referring Provider:     Linna Darner Primary Care Physician:  Harlan Stains, MD Primary Gastroenterologist:  Lyla Son  Reason for Consultation:     Symptomatic anemia         HPI:   [name] Le is a 71 y.o. female with a history of DM, HTN, HLD, who presented to the hospital complaining of hyperglycemia and left arm pain. Also noted to have worsening fatigue and weakness. On lab review she had a new anemia with Hgb of 7.5, MCV of 76, ferritin of 6, consistent with iron deficiency anemia. GI consulted to assist in management. Last Hgb known was 11.8  She reports taking a daily iron tablet, endorses chronically dark stools. She states she doesn't really notice a clear change in her bowel habits recently, has one BM per day. She denies any bright red blood per rectum. She has been taking one goody powder per day for her arm pain. She has periodic solid food dysphagia, no prior EGDs. She eats well, is gaining weight, not losing weight. She denies abdominal pains. Last colonoscopy in Sept 2007 which showed hemorrhoids and diverticulosis. No polyps. She denies FH of colon, gastric, or esophageal cancer. She denies other NSAID use.      Past Medical History:  Diagnosis Date  . Diabetes mellitus without complication ([facility])   . High cholesterol   . Hypertension     Past Surgical History:  Procedure Laterality Date  . ABDOMINAL HYSTERECTOMY    . REPLACEMENT TOTAL KNEE Right     Family History  Problem Relation Age of Onset  . Stroke Mother   . Cancer Father        pts states cancer in back  . Lung cancer Sister   . Heart disease Sister   . Cancer Brother        "  . Colon cancer Neg Hx      Social History  Substance Use Topics  . Smoking status: Never Smoker  . Smokeless tobacco: Never Used  . Alcohol use Yes     Comment: wine occasional per pt.     Prior to Admission medications   Medication Sig Start Date End Date Taking? Authorizing Provider    albuterol (PROAIR HFA) 108 (90 BASE) MCG/ACT inhaler Inhale 2 puffs into the lungs every 4 (four) hours as needed for wheezing or shortness of breath. 07/15/15  Yes Parrett, Tammy S, NP  amLODipine (NORVASC) 5 MG tablet Take 5 mg by mouth daily. 10/02/15  Yes [provider]  aspirin 81 MG tablet Take 81 mg by mouth daily.   Yes [provider]  bisacodyl (DULCOLAX) 5 MG EC tablet Take 5 mg by mouth once. Per colon prep instructions-take as directed.   Yes [provider]  budesonide-formoterol (SYMBICORT) 160-4.5 MCG/ACT inhaler Inhale 2 puffs into the lungs 2 (two) times daily. 07/15/15  Yes Parrett, Tammy S, NP  iron polysaccharides (NIFEREX) 150 MG capsule Take 150 mg by mouth daily.   Yes [provider]  metFORMIN (GLUCOPHAGE-XR) 500 MG 24 hr tablet Take 500 mg by mouth daily.  05/15/14  Yes [provider]  NOVOLOG MIX 70/30 FLEXPEN (70-30) 100 UNIT/ML FlexPen Inject 48 Units into the skin daily with breakfast.  11/02/14  Yes [provider]  polyethylene glycol powder (MIRALAX) powder Take 1 Container by mouth daily. Per colon prep instructions-take as directed.   Yes [provider]  pravastatin (PRAVACHOL) 80 MG tablet TAKE 1  TABLET IN THE EVENING DAILY 02/28/15  Yes [provider]  valsartan (DIOVAN) 320 MG tablet Take 320 mg by mouth daily. 10/02/15  Yes [provider]    Current Facility-Administered Medications  Medication Dose Route Frequency Provider Last Rate Last Dose  . 0.9 %  sodium chloride infusion  10 mL/hr Intravenous Once Larene Pickett, PA-C      . acetaminophen (TYLENOL) tablet 650 mg  650 mg Oral Q6H PRN Waldemar Dickens, MD       Or  . acetaminophen (TYLENOL) suppository 650 mg  650 mg Rectal Q6H PRN Waldemar Dickens, MD      . amLODipine (NORVASC) tablet 5 mg  5 mg Oral Daily Waldemar Dickens, MD      . aspirin chewable tablet 81 mg  81 mg Oral Daily Waldemar Dickens, MD      . diclofenac  sodium (VOLTAREN) 1 % transdermal gel 4 g  4 g Topical QID PRN Waldemar Dickens, MD      . hydrALAZINE (APRESOLINE) injection 5-10 mg  5-10 mg Intravenous Q4H PRN Waldemar Dickens, MD   10 mg at 02/04/17 1221  . insulin aspart (novoLOG) injection 0-5 Units  0-5 Units Subcutaneous QHS Waldemar Dickens, MD      . insulin aspart (novoLOG) injection 0-9 Units  0-9 Units Subcutaneous TID WC Waldemar Dickens, MD      . insulin aspart protamine - aspart (NOVOLOG 70/30 MIX) FlexPen 24 Units  24 Units Subcutaneous BID WC Waldemar Dickens, MD      . irbesartan (AVAPRO) tablet 300 mg  300 mg Oral Daily Waldemar Dickens, MD      . iron polysaccharides (NIFEREX) capsule 150 mg  150 mg Oral Daily Waldemar Dickens, MD      . methocarbamol (ROBAXIN) 500 mg in dextrose 5 % 50 mL IVPB  500 mg Intravenous Q8H PRN Waldemar Dickens, MD      . mometasone-formoterol [facility]) 200-5 MCG/ACT inhaler 2 puff  2 puff Inhalation BID Waldemar Dickens, MD      . ondansetron [facility]) tablet 4 mg  4 mg Oral Q6H PRN Waldemar Dickens, MD       Or  . ondansetron [facility]) injection 4 mg  4 mg Intravenous Q6H PRN Waldemar Dickens, MD      . pantoprazole (PROTONIX) injection 40 mg  40 mg Intravenous Once Quincy Carnes M, PA-C      . pantoprazole (PROTONIX) injection 40 mg  40 mg Intravenous Q12H Waldemar Dickens, MD      . polyethylene glycol powder (GLYCOLAX/MIRALAX) container 255 g  1 Container Oral Daily Waldemar Dickens, MD      . pravastatin (PRAVACHOL) tablet 80 mg  80 mg Oral Daily Waldemar Dickens, MD       Current Outpatient Prescriptions  Medication Sig Dispense Refill  . albuterol (PROAIR HFA) 108 (90 BASE) MCG/ACT inhaler Inhale 2 puffs into the lungs every 4 (four) hours as needed for wheezing or shortness of breath. 1 Inhaler 5  . amLODipine (NORVASC) 5 MG tablet Take 5 mg by mouth daily.  5  . aspirin 81 MG tablet Take 81 mg by mouth daily.    . bisacodyl (DULCOLAX) 5 MG EC tablet Take 5 mg by mouth once. Per colon  prep instructions-take as directed.    . budesonide-formoterol (SYMBICORT) 160-4.5 MCG/ACT inhaler Inhale 2 puffs into the lungs 2 (two) times daily. 1 Inhaler  5  . iron polysaccharides (NIFEREX) 150 MG capsule Take 150 mg by mouth daily.    . metFORMIN (GLUCOPHAGE-XR) 500 MG 24 hr tablet Take 500 mg by mouth daily.   1  . NOVOLOG MIX 70/30 FLEXPEN (70-30) 100 UNIT/ML FlexPen Inject 48 Units into the skin daily with breakfast.   5  . polyethylene glycol powder (MIRALAX) powder Take 1 Container by mouth daily. Per colon prep instructions-take as directed.    . pravastatin (PRAVACHOL) 80 MG tablet TAKE 1 TABLET IN THE EVENING DAILY  5  . valsartan (DIOVAN) 320 MG tablet Take 320 mg by mouth daily.  5    Allergies as of 02/04/2017 - Review Complete 02/04/2017  Allergen Reaction Noted  . Ace inhibitors Cough 03/29/2015     Review of Systems:    As per HPI, otherwise negative    Physical Exam:  Vital signs in last 24 hours: Temp:  [98.5 F (36.9 C)] 98.5 F (36.9 C) (05/28 0827) Pulse Rate:  [63-72] 65 (05/28 1215) Resp:  [9-16] 13 (05/28 1215) BP: (169-215)/(77-87) 215/78 (05/28 1221) SpO2:  [94 %-100 %] 100 % (05/28 1215) Weight:  [189 lb (85.7 kg)] 189 lb (85.7 kg) (05/28 6962)   General:   Pleasant female in NAD Head:  Normocephalic and atraumatic. Eyes:   No icterus.   Conjunctiva pale. Lungs:  Respirations even and unlabored. Lungs clear to auscultation bilaterally.   No wheezes, crackles, or rhonchi.  Heart:  Regular rate and rhythm; no MRG Abdomen:  Soft, nondistended but protuberant, nontender. . No appreciable masses  Rectal:  Not performed.  Msk:  Symmetrical without gross deformities.  Extremities:  Without edema. Neurologic:  Alert and  oriented x4;  grossly normal neurologically. Skin:  Intact without significant lesions or rashes. Psych:  Alert and cooperative. Normal affect.  LAB RESULTS:  Recent Labs  02/04/17 0858  WBC 4.8  HGB 7.5*  HCT 25.6*  PLT 286    BMET  Recent Labs  02/04/17 0858  NA 142  K 3.5  CL 111  CO2 24  GLUCOSE 121*  BUN 18  CREATININE 0.87  CALCIUM 10.5*   LFT No results for input(s): PROT, ALBUMIN, AST, ALT, ALKPHOS, BILITOT, BILIDIR, IBILI in the last 72 hours. PT/INR No results for input(s): LABPROT, INR in the last 72 hours.  CBC Latest Ref Rng & Units 02/04/2017 12/01/2012 04/02/2011  WBC 4.0 - 10.5 K/uL 4.8 3.7(L) 7.4  Hemoglobin 12.0 - 15.0 g/dL 7.5(L) 11.8(L) 8.7(L)  Hematocrit 36.0 - 46.0 % 25.6(L) 35.5(L) 26.2(L)  Platelets 150 - 400 K/uL 286 247 181    Lab Results  Component Value Date   IRON 20 (L) 02/04/2017   TIBC 372 02/04/2017   FERRITIN 6 (L) 02/04/2017     STUDIES: Dg Chest 2 View  Result Date: 02/04/2017 CLINICAL DATA:  Left shoulder pain and chest pain. EXAM: CHEST  2 VIEW COMPARISON:  07/15/2015 FINDINGS: Mild cardiac enlargement. Mediastinal contours are within normal limits. Both lungs are clear. Scoliosis and degenerative disc disease is noted within the thoracic spine. IMPRESSION: No active cardiopulmonary disease. Electronically Signed   By: Kerby Moors M.D.   On: 02/04/2017 09:35   Dg Shoulder Left  Result Date: 02/04/2017 CLINICAL DATA:  Left shoulder pain common no known injury, initial encounter EXAM: LEFT SHOULDER - 2+ VIEW COMPARISON:  None. FINDINGS: There is no evidence of fracture or dislocation. There is no evidence of arthropathy or other focal bone abnormality. Soft tissues are unremarkable. IMPRESSION:  No acute abnormality noted. Electronically Signed   By: Inez Catalina M.D.   On: 02/04/2017 09:35      Impression / Plan:  71 y/o female with history as outlined above, who presented to the ER for arm pain / weakness, found to have symptomatic iron deficiency anemia. She endorses chronic dark stools while taking oral iron, but does admit to routine NSAID use. Last colonoscopy > 10 years ago. Mild dysphagia on ROS.  I discussed iron deficiency anemia with the  patient and husband, discussed differential and recommended initial evaluation with upper and lower endoscopy. I outlined risks / benefits of endoscopy and anesthesia with the patient and she wished to proceed.   She will receive PRBC transfusion today, and then plan for bowel prep for her procedures tomorrow. Clear liquid diet okay today. I would put her on empiric PPI until the endoscopy is done, although normal BUN argues against active upper GI bleeding.  Please call with questions in the interim, otherwise Dr. Loletha Carrow to assume her GI care tomorrow.   [place] Cellar, MD [facility] Gastroenterology Pager [phone]

## 2017-02-04 NOTE — ED Notes (Signed)
Pt CBG was 116, notified Hayley(RN)

## 2017-02-04 NOTE — ED Triage Notes (Signed)
Pt. Reports having increased blood sugar, dizziness, neck and back pain for the past two weeks. Last night patient reports blood sugar being in the 400's. She took 50 units of  70/30 novolog last night and sugar was 76 this am so she ate breakfast and then was 156. Patient concerned sugar is beginning to rise again so she presents here.

## 2017-02-05 ENCOUNTER — Observation Stay (HOSPITAL_COMMUNITY): Payer: Medicare Other | Admitting: Certified Registered"

## 2017-02-05 ENCOUNTER — Encounter: Payer: Self-pay | Admitting: Gastroenterology

## 2017-02-05 ENCOUNTER — Encounter (HOSPITAL_COMMUNITY): Payer: Self-pay | Admitting: Certified Registered"

## 2017-02-05 ENCOUNTER — Encounter (HOSPITAL_COMMUNITY)
Admission: EM | Disposition: A | Discharge status: Home / Self Care | Payer: Self-pay | Source: Home / Self Care | Attending: Physician Assistant

## 2017-02-05 DIAGNOSIS — K5520 Angiodysplasia of colon without hemorrhage: Secondary | ICD-10-CM

## 2017-02-05 DIAGNOSIS — K558 Other vascular disorders of intestine: Secondary | ICD-10-CM | POA: Diagnosis not present

## 2017-02-05 DIAGNOSIS — G8929 Other chronic pain: Secondary | ICD-10-CM | POA: Diagnosis not present

## 2017-02-05 DIAGNOSIS — D500 Iron deficiency anemia secondary to blood loss (chronic): Secondary | ICD-10-CM | POA: Diagnosis not present

## 2017-02-05 DIAGNOSIS — K222 Esophageal obstruction: Secondary | ICD-10-CM | POA: Diagnosis not present

## 2017-02-05 DIAGNOSIS — D649 Anemia, unspecified: Secondary | ICD-10-CM | POA: Diagnosis not present

## 2017-02-05 DIAGNOSIS — K922 Gastrointestinal hemorrhage, unspecified: Secondary | ICD-10-CM | POA: Diagnosis not present

## 2017-02-05 DIAGNOSIS — K31819 Angiodysplasia of stomach and duodenum without bleeding: Secondary | ICD-10-CM

## 2017-02-05 DIAGNOSIS — M25512 Pain in left shoulder: Secondary | ICD-10-CM | POA: Diagnosis not present

## 2017-02-05 DIAGNOSIS — K449 Diaphragmatic hernia without obstruction or gangrene: Secondary | ICD-10-CM | POA: Diagnosis not present

## 2017-02-05 DIAGNOSIS — I10 Essential (primary) hypertension: Secondary | ICD-10-CM | POA: Diagnosis not present

## 2017-02-05 DIAGNOSIS — E118 Type 2 diabetes mellitus with unspecified complications: Secondary | ICD-10-CM | POA: Diagnosis not present

## 2017-02-05 HISTORY — PX: ESOPHAGOGASTRODUODENOSCOPY (EGD) WITH PROPOFOL: SHX5813

## 2017-02-05 HISTORY — PX: COLONOSCOPY WITH PROPOFOL: SHX5780

## 2017-02-05 LAB — BASIC METABOLIC PANEL
Anion gap: 8 (ref 5–15)
Anion gap: 10 (ref 5–15)
BUN: 14 mg/dL (ref 6–20)
BUN: 16 mg/dL (ref 6–20)
CO2: 21 mmol/L — ABNORMAL LOW (ref 22–32)
CO2: 18 mmol/L — ABNORMAL LOW (ref 22–32)
Calcium: 10.2 mg/dL (ref 8.9–10.3)
Calcium: 10.1 mg/dL (ref 8.9–10.3)
Chloride: 112 mmol/L — ABNORMAL HIGH (ref 101–111)
Chloride: 111 mmol/L (ref 101–111)
Creatinine, Ser: 0.93 mg/dL (ref 0.44–1.00)
Creatinine, Ser: 1.08 mg/dL — ABNORMAL HIGH (ref 0.44–1.00)
GFR calc Af Amer: >60 mL/min (ref >60)
GFR calc Af Amer: 58 mL/min — ABNORMAL LOW (ref >60)
GFR calc non Af Amer: 60 mL/min — ABNORMAL LOW (ref >60)
GFR calc non Af Amer: 50 mL/min — ABNORMAL LOW (ref >60)
Glucose, Bld: 127 mg/dL — ABNORMAL HIGH (ref 65–99)
Glucose, Bld: 190 mg/dL — ABNORMAL HIGH (ref 65–99)
Potassium: 3.9 mmol/L (ref 3.5–5.1)
Potassium: 3.3 mmol/L — ABNORMAL LOW (ref 3.5–5.1)
Sodium: 141 mmol/L (ref 135–145)
Sodium: 139 mmol/L (ref 135–145)

## 2017-02-05 LAB — TYPE AND SCREEN
ABO/RH(D): O POS
Antibody Screen: NEGATIVE
Unit division: 0
Unit division: 0

## 2017-02-05 LAB — CBC
HCT: 33.5 % — ABNORMAL LOW (ref 36.0–46.0)
Hemoglobin: 10.3 g/dL — ABNORMAL LOW (ref 12.0–15.0)
MCH: 23.6 pg — ABNORMAL LOW (ref 26.0–34.0)
MCHC: 30.7 g/dL (ref 30.0–36.0)
MCV: 76.8 fL — ABNORMAL LOW (ref 78.0–100.0)
Platelets: 272 10*3/uL (ref 150–400)
RBC: 4.36 MIL/uL (ref 3.87–5.11)
RDW: 17.2 % — ABNORMAL HIGH (ref 11.5–15.5)
WBC: 5.7 10*3/uL (ref 4.0–10.5)

## 2017-02-05 LAB — BPAM RBC
Blood Product Expiration Date: 201806052359
Blood Product Expiration Date: 201806212359
ISSUE DATE / TIME: 201805281930
ISSUE DATE / TIME: 201805282241
Unit Type and Rh: 5100
Unit Type and Rh: 5100

## 2017-02-05 LAB — GLUCOSE, CAPILLARY
Glucose-Capillary: 142 mg/dL — ABNORMAL HIGH (ref 65–99)
Glucose-Capillary: 112 mg/dL — ABNORMAL HIGH (ref 65–99)
Glucose-Capillary: 77 mg/dL (ref 65–99)
Glucose-Capillary: 204 mg/dL — ABNORMAL HIGH (ref 65–99)
Glucose-Capillary: 188 mg/dL — ABNORMAL HIGH (ref 65–99)

## 2017-02-05 SURGERY — ESOPHAGOGASTRODUODENOSCOPY (EGD) WITH PROPOFOL
Anesthesia: Monitor Anesthesia Care

## 2017-02-05 MED ORDER — POTASSIUM CHLORIDE CRYS ER 20 MEQ PO TBCR
40.0000 meq | EXTENDED_RELEASE_TABLET | Freq: Two times a day (BID) | ORAL | Status: AC
  Administered 2017-02-05 (×2): 40 meq via ORAL
  Filled 2017-02-05 (×2): qty 2

## 2017-02-05 MED ORDER — PROPOFOL 10 MG/ML IV BOLUS
INTRAVENOUS | Status: DC | PRN
  Administered 2017-02-05: 10 mg via INTRAVENOUS
  Administered 2017-02-05: 20 mg via INTRAVENOUS
  Administered 2017-02-05: 30 mg via INTRAVENOUS
  Administered 2017-02-05: 10 mg via INTRAVENOUS

## 2017-02-05 MED ORDER — SODIUM CHLORIDE 0.9 % IV SOLN
INTRAVENOUS | Status: DC | PRN
  Administered 2017-02-05: 11:00:00 via INTRAVENOUS

## 2017-02-05 MED ORDER — PROPOFOL 500 MG/50ML IV EMUL
INTRAVENOUS | Status: DC | PRN
  Administered 2017-02-05: 100 ug/kg/min via INTRAVENOUS

## 2017-02-05 MED ORDER — SODIUM CHLORIDE 0.9 % IV SOLN
INTRAVENOUS | Status: DC
  Administered 2017-02-05 – 2017-02-06 (×2): via INTRAVENOUS

## 2017-02-05 SURGICAL SUPPLY — 24 items

## 2017-02-05 NOTE — Evaluation (Signed)
Physical Therapy Evaluation Patient Details Name: [name] Le MRN: [id number] DOB: 1946/09/02 Today's Date: 02/05/2017   History of Present Illness  Pt is a 71 yo female admitted with high blood sugar and L shoulder pain. Pt found to be anemic dx with GI bleed. PMH for DM 2, HTN, hyperlipidemia and R knee TKA.   Clinical Impression  Patient evaluated by Physical Therapy with no further acute PT needs identified. All education has been completed and the patient has no further questions. Pt currently mod I for bed mobility, and supervision for transfers and ambulation of 20 feet. Pt very lethargic and would benefit from use of RW when she first returns home for improved safety.  See below for any follow-up Physical Therapy or equipment needs. PT is signing off. Thank you for this referral.     Follow Up Recommendations No PT follow up    Equipment Recommendations  None recommended by PT       Precautions / Restrictions Precautions Precautions: None Restrictions Weight Bearing Restrictions: No      Mobility  Bed Mobility Overal bed mobility: Needs Assistance Bed Mobility: Sit to Supine;Supine to Sit     Supine to sit: Modified independent (Device/Increase time);HOB elevated Sit to supine: Modified independent (Device/Increase time);HOB elevated   General bed mobility comments: able to utilize bed rails to pull herself to EoB  Transfers Overall transfer level: Needs assistance Equipment used: Rolling walker (2 wheeled) Transfers: Sit to/from Stand Sit to Stand: Supervision         General transfer comment: pt able to power up and steady herself at [facility] with no assist  Ambulation/Gait Ambulation/Gait assistance: Supervision Ambulation Distance (Feet): 20 Feet Assistive device: Rolling walker (2 wheeled);None Gait Pattern/deviations: Step-through pattern;Shuffle;Trunk flexed Gait velocity: decreased Gait velocity interpretation: Below normal speed for  age/gender General Gait Details: pt ambulated 10 feet with RW with flexed posture but good stability and safety, pt then ambulated 10 feet without AD and had more shuffling gait no LoB, no buckling pt educated that she would benefit from use of RW to increase her safety.      Balance Overall balance assessment: Modified Independent                                           Pertinent Vitals/Pain Pain Assessment: 0-10 Pain Score: 5  Pain Location: abdomen and headache Pain Descriptors / Indicators: Cramping;Burning;Constant Pain Intervention(s): Monitored during session  VSS    Home Living Family/patient expects to be discharged to:: Private residence Living Arrangements: Spouse/significant other;Children Available Help at Discharge: Available 24 hours/day;Family;Friend(s) Type of Home: House Home Access: Level entry     Home Layout: One level Home Equipment: Bedside commode;Grab bars - tub/shower      Prior Function Level of Independence: Independent         Comments: Advertising copywriter         Extremity/Trunk Assessment   Upper Extremity Assessment Upper Extremity Assessment: RUE deficits/detail RUE Deficits / Details: R shoulder flexion ROM and strength limited by pain, RUE: Unable to fully assess due to pain    Lower Extremity Assessment Lower Extremity Assessment: Generalized weakness       Communication   Communication: No difficulties  Cognition Arousal/Alertness: Lethargic Behavior During Therapy: WFL for tasks assessed/performed Overall Cognitive Status: Within Functional Limits for tasks assessed  General Comments: tired after procedure this morning             Assessment/Plan    PT Assessment Patent does not need any further PT services         PT Goals (Current goals can be found in the Care Plan section)  Acute Rehab PT Goals Patient Stated Goal: go home to  her own bed PT Goal Formulation: All assessment and education complete, DC therapy     AM-PAC PT "6 Clicks" Daily Activity  Outcome Measure Difficulty turning over in bed (including adjusting bedclothes, sheets and blankets)?: None Difficulty moving from lying on back to sitting on the side of the bed? : None Difficulty sitting down on and standing up from a chair with arms (e.g., wheelchair, bedside commode, etc,.)?: None Help needed moving to and from a bed to chair (including a wheelchair)?: None Help needed walking in hospital room?: None Help needed climbing 3-5 steps with a railing? : A Little 6 Click Score: 23    End of Session Equipment Utilized During Treatment: Gait belt Activity Tolerance: Patient limited by lethargy (pt extremely lethargic following procedure this am) Patient left: in bed;with call bell/phone within reach;with family/visitor present Nurse Communication: Mobility status PT Visit Diagnosis: Muscle weakness (generalized) (M62.81)    Time: 6294-7654 PT Time Calculation (min) (ACUTE ONLY): 18 min   Charges:   PT Evaluation $PT Eval Low Complexity: 1 Procedure     PT G Codes:   PT G-Codes **NOT FOR INPATIENT CLASS** Functional Assessment Tool Used: AM-PAC 6 Clicks Basic Mobility Functional Limitation: Mobility: Walking and moving around Mobility: Walking and Moving Around Current Status ([id number]): At least 1 percent but less than 20 percent impaired, limited or restricted Mobility: Walking and Moving Around Goal Status [phone]): At least 1 percent but less than 20 percent impaired, limited or restricted Mobility: Walking and Moving Around Discharge Status [phone]): At least 1 percent but less than 20 percent impaired, limited or restricted    Benjamine Mola B. Migdalia Dk PT, DPT Acute Rehabilitation  [phone] Pager [phone]    [place] 02/05/2017, 3:22 PM

## 2017-02-05 NOTE — Transfer of Care (Signed)
Immediate Anesthesia Transfer of Care Note  Patient: [name] Le  Procedure(s) Performed: Procedure(s): ESOPHAGOGASTRODUODENOSCOPY (EGD) WITH PROPOFOL (N/A) COLONOSCOPY WITH PROPOFOL (N/A)  Patient Location: Endoscopy Unit  Anesthesia Type:MAC  Level of Consciousness: drowsy  Airway & Oxygen Therapy: Patient Spontanous Breathing and Patient connected to nasal cannula oxygen  Post-op Assessment: Report given to RN, Post -op Vital signs reviewed and stable and Patient moving all extremities  Post vital signs: Reviewed and stable  Last Vitals:  Vitals:   02/05/17 1028 02/05/17 1135  BP: (!) 185/84 (!) 159/66  Pulse: 87 75  Resp: 14 (!) 8  Temp: 37.3 C     Last Pain:  Vitals:   02/05/17 1028  TempSrc: Oral  PainSc:          Complications: No apparent anesthesia complications

## 2017-02-05 NOTE — Progress Notes (Addendum)
Patient claimed that she did'nt really feel well when she got back from procedure, MD made aware and cancelled her discharge, patient started on fluids, v/s taken and charted , patient resting, stat labs as ordered.

## 2017-02-05 NOTE — Progress Notes (Signed)
PT Cancellation Note  Patient Details Name: [name] Le MRN: [id number] DOB: [birth year]   Cancelled Treatment:    Reason Eval/Treat Not Completed: Patient at procedure or test/unavailable Will check back this afternoon as able. Thank you.  [place] B. Migdalia Dk PT, DPT Acute Rehabilitation  [phone] Pager [phone]     [place] 02/05/2017, 10:00 AM

## 2017-02-05 NOTE — Progress Notes (Signed)
PROGRESS NOTE    [name] Le  GBM:[id number] DOB: 09-Dec-1945 DOA: 02/04/2017 PCP: Harlan Stains, MD    Brief Narrative: [name] Le is a 71 y.o. female with medical history significant of diabetes, hypertension, hyperlipidemia. Patient presenting with a complaint of high sugar and left arm pain. She ws found to have hemoglobin of 7.5 from a baseline 9 to 11.  She received 2 units prbc transfusion and Gi consulted.   Assessment & Plan:   Active Problems:   Essential hypertension   Hyperlipidemia   Symptomatic anemia   GI bleed   Left shoulder pain   Diabetes mellitus with complication (HCC)   Iron deficiency anemia due to chronic blood loss   AVM (arteriovenous malformation) of small bowel, acquired (HCC)   GI  BLEED/ symptomatic anemia:  Secondary to chronic iron deficiency from blood loss.  GI consulted and she underwent EGD and colonoscopy. Colonoscopy was normal. EGD showed small hiatal hernia, small non bleeding angio ectasia in the duodenum. And benign appearing esophageal stenosis.  Recommended outpatient follow up with PCP in one week and check hemoglobin and follow up with GI in 4 weeks .   Post procedures pt developed dizziness, weak, pounding headache and tachycardia.  BMP showed hypokalemia, and she appears dehydrated.  Started her on IV fluids and replaced potassium.  Check orthostatic in am before discharge.   Left shoulder pain improved.  Unlikely cardiac in etiology.  Pain control.    Diabetes mellitus:  hgba1c is pending.  CBG (last 3)   Recent Labs  02/05/17 1027 02/05/17 1227 02/05/17 1708  GLUCAP 112* 77 204*    Resume SSI.     DVT prophylaxis: (scd's) Code Status:  Full code.  Family Communication: (family at bedside.  Disposition Plan: possibly home tomorrow   Consultants:   Dr Loletha Carrow   Procedures: EGD and colonoscopy.   Antimicrobials: none   Subjective: Feels weak and has a headache.  Objective: Vitals:   02/05/17 1150 02/05/17 1200 02/05/17 1403 02/05/17 1535  BP: (!) 174/68 (!) 183/81 (!) 171/62 135/63  Pulse: 75 73 90 (!) 103  Resp: 12 19 19    Temp:   99 F (37.2 C)   TempSrc:   Oral   SpO2: 100% 99% 100%   Weight:      Height:        Intake/Output Summary (Last 24 hours) at 02/05/17 1828 Last data filed at 02/05/17 1748  Gross per 24 hour  Intake          1417.33 ml  Output              100 ml  Net          1317.33 ml   Filed Weights   02/04/17 0822 02/05/17 1028  Weight: 85.7 kg (189 lb) 85.7 kg (189 lb)    Examination:  General exam: Appears calm and comfortable  Respiratory system: Clear to auscultation. Respiratory effort normal. Cardiovascular system: S1 & S2 heard, RRR. No JVD, murmurs, rubs, gallops or clicks. No pedal edema. Gastrointestinal system: Abdomen is nondistended, soft and nontender. No organomegaly or masses felt. Normal bowel sounds heard. Central nervous system: Alert and oriented. No focal neurological deficits. Extremities: Symmetric 5 x 5 power. Skin: No rashes, lesions or ulcers     Data Reviewed: I have personally reviewed following labs and imaging studies  CBC:  Recent Labs Lab 02/04/17 0858 02/04/17 1659 02/05/17 0509  WBC 4.8 4.9 5.7  NEUTROABS 2.9  --   --  HGB 7.5* 8.5* 10.3*  HCT 25.6* 29.3* 33.5*  MCV 76.2* 75.7* 76.8*  PLT 286 300 250   Basic Metabolic Panel:  Recent Labs Lab 02/04/17 0858 02/05/17 0509 02/05/17 1606  NA 142 141 139  K 3.5 3.9 3.3*  CL 111 112* 111  CO2 24 21* 18*  GLUCOSE 121* 127* 190*  BUN 18 14 16   CREATININE 0.87 0.93 1.08*  CALCIUM 10.5* 10.2 10.1   GFR: Estimated Creatinine Clearance: 52 mL/min (A) (by C-G formula based on SCr of 1.08 mg/dL (H)). Liver Function Tests: No results for input(s): AST, ALT, ALKPHOS, BILITOT, PROT, ALBUMIN in the last 168 hours. No results for input(s): LIPASE, AMYLASE in the last 168 hours. No results for input(s): AMMONIA in the last 168  hours. Coagulation Profile: No results for input(s): INR, PROTIME in the last 168 hours. Cardiac Enzymes: No results for input(s): CKTOTAL, CKMB, CKMBINDEX, TROPONINI in the last 168 hours. BNP (last 3 results) No results for input(s): PROBNP in the last 8760 hours. HbA1C: No results for input(s): HGBA1C in the last 72 hours. CBG:  Recent Labs Lab 02/04/17 2116 02/05/17 0737 02/05/17 1027 02/05/17 1227 02/05/17 1708  GLUCAP 192* 142* 112* 77 204*   Lipid Profile: No results for input(s): CHOL, HDL, LDLCALC, TRIG, CHOLHDL, LDLDIRECT in the last 72 hours. Thyroid Function Tests: No results for input(s): TSH, T4TOTAL, FREET4, T3FREE, THYROIDAB in the last 72 hours. Anemia Panel:  Recent Labs  02/04/17 1034  VITAMINB12 605  FOLATE 13.0  FERRITIN 6*  TIBC 372  IRON 20*  RETICCTPCT 3.1   Sepsis Labs: No results for input(s): PROCALCITON, LATICACIDVEN in the last 168 hours.  No results found for this or any previous visit (from the past 240 hour(s)).       Radiology Studies: Dg Chest 2 View  Result Date: 02/04/2017 CLINICAL DATA:  Left shoulder pain and chest pain. EXAM: CHEST  2 VIEW COMPARISON:  07/15/2015 FINDINGS: Mild cardiac enlargement. Mediastinal contours are within normal limits. Both lungs are clear. Scoliosis and degenerative disc disease is noted within the thoracic spine. IMPRESSION: No active cardiopulmonary disease. Electronically Signed   By: Kerby Moors M.D.   On: 02/04/2017 09:35   Dg Shoulder Left  Result Date: 02/04/2017 CLINICAL DATA:  Left shoulder pain common no known injury, initial encounter EXAM: LEFT SHOULDER - 2+ VIEW COMPARISON:  None. FINDINGS: There is no evidence of fracture or dislocation. There is no evidence of arthropathy or other focal bone abnormality. Soft tissues are unremarkable. IMPRESSION: No acute abnormality noted. Electronically Signed   By: Inez Catalina M.D.   On: 02/04/2017 09:35        Scheduled Meds: .  amLODipine  5 mg Oral Daily  . aspirin  81 mg Oral Daily  . insulin aspart  0-5 Units Subcutaneous QHS  . insulin aspart  0-9 Units Subcutaneous TID WC  . insulin aspart protamine- aspart  24 Units Subcutaneous BID WC  . irbesartan  300 mg Oral Daily  . mometasone-formoterol  2 puff Inhalation BID  . pantoprazole (PROTONIX) IV  40 mg Intravenous Q12H  . potassium chloride  40 mEq Oral BID  . pravastatin  80 mg Oral Daily   Continuous Infusions: . sodium chloride 100 mL/hr at 02/05/17 1731  . methocarbamol (ROBAXIN)  IV       LOS: 0 days    Time spent: 35 minutes.     Hosie Poisson, MD [facility] Pager [phone]  If 7PM-7AM, please contact night-coverage [email or web address]  Password [email or web address] 02/05/2017, 6:28 PM

## 2017-02-05 NOTE — Op Note (Signed)
[facility] Patient Name: [name] Le Procedure Date : 02/05/2017 MRN: [id number] Attending MD: Estill Cotta. Loletha Carrow , MD Date of Birth: 1946-01-25 CSN: [id number] Age: 71 Admit Type: Inpatient Procedure:                Upper GI endoscopy Indications:              Suspected upper gastrointestinal bleeding in                            patient with unexplained iron deficiency anemia Providers:                Mallie Mussel L. Loletha Carrow, MD, Dortha Schwalbe RN, RN, Cletis Athens, Technician, Claybon Jabs CRNA, CRNA Referring MD:              Medicines:                Monitored Anesthesia Care Complications:            No immediate complications. Estimated Blood Loss:     Estimated blood loss was minimal. Procedure:                Pre-Anesthesia Assessment:                           - Prior to the procedure, a History and Physical                            was performed, and patient medications and                            allergies were reviewed. The patient's tolerance of                            previous anesthesia was also reviewed. The risks                            and benefits of the procedure and the sedation                            options and risks were discussed with the patient.                            All questions were answered, and informed consent                            was obtained. Anticoagulants: The patient has taken                            aspirin. It was decided not to withhold this                            medication prior to the procedure. ASA Grade  Assessment: II - A patient with mild systemic                            disease. After reviewing the risks and benefits,                            the patient was deemed in satisfactory condition to                            undergo the procedure.                           After obtaining informed consent, the endoscope was        passed under direct vision. Throughout the                            procedure, the patient's blood pressure, pulse, and                            oxygen saturations were monitored continuously. The                            [id number] ([id number]) scope was introduced through the                            mouth, and advanced to the second part of duodenum.                            The upper GI endoscopy was accomplished without                            difficulty. The patient tolerated the procedure                            well. Scope In: Scope Out: Findings:      A small hiatal hernia was present.      One mild benign-appearing, intrinsic stenosis was found at the       gastroesophageal junction. This measured 1.5 cm (inner diameter) and was       traversed.      The entire examined stomach was normal except for being markedly       J-shaped.      A single 2 mm angioectasia without bleeding was found in the second       portion of the duodenum. APC on small bowel setting, used to ablate the       AVM with excellent result. Estimated blood loss was minimal. Impression:               - Small hiatal hernia.                           - Benign-appearing esophageal stenosis.                           - Normal stomach.                           -  A single non-bleeding angioectasia in the                            duodenum.                           - No specimens collected. Moderate Sedation:      MAC sedation used Recommendation:           - Diabetic (ADA) diet.                           - Continue present medications.                           - The patient can be discharged home today to see                            me in clinic in about 4 weeks.                           Patient should be seen by primary care in 1-2 weeks                            to recheck hemoglobin.                           If hemoglobin continues to drop despite adeauate                             oral iron therapy, IV iron should be given.                           If hemoglobin continues to drop, consideration must                            be given to small bowel video capsule study.                            However, that would have to be done with endoscoic                            delivery due to the mild esophageal stricture and                            gastric anatomy described above.                           - See the other procedure note for documentation of                            additional recommendations. Procedure Code(s):        --- Professional ---                           [phone], Esophagogastroduodenoscopy, flexible,  transoral; diagnostic, including collection of                            specimen(s) by brushing or washing, when performed                            (separate procedure) Diagnosis Code(s):        --- Professional ---                           K44.9, Diaphragmatic hernia without obstruction or                            gangrene                           K22.2, Esophageal obstruction                           K31.819, Angiodysplasia of stomach and duodenum                            without bleeding                           D50.9, Iron deficiency anemia, unspecified CPT copyright 2016 American Medical Association. All rights reserved. The codes documented in this report are preliminary and upon coder review may  be revised to meet current compliance requirements. Lener Ventresca L. Loletha Carrow, MD 02/05/2017 11:46:24 AM This report has been signed electronically. Number of Addenda: 0

## 2017-02-05 NOTE — Anesthesia Postprocedure Evaluation (Signed)
Anesthesia Post Note  Patient: [name] Le  Procedure(s) Performed: Procedure(s) (LRB): ESOPHAGOGASTRODUODENOSCOPY (EGD) WITH PROPOFOL (N/A) COLONOSCOPY WITH PROPOFOL (N/A)  Patient location during evaluation: PACU Anesthesia Type: MAC Level of consciousness: awake and alert Pain management: pain level controlled Vital Signs Assessment: post-procedure vital signs reviewed and stable Respiratory status: spontaneous breathing, nonlabored ventilation, respiratory function stable and patient connected to nasal cannula oxygen Cardiovascular status: stable and blood pressure returned to baseline Anesthetic complications: no       Last Vitals:  Vitals:   02/05/17 1200 02/05/17 1403  BP: (!) 183/81 (!) 171/62  Pulse: 73 90  Resp: 19 19  Temp:  37.2 C    Last Pain:  Vitals:   02/05/17 1403  TempSrc: Oral  PainSc:                  [name] DAVID

## 2017-02-05 NOTE — Anesthesia Preprocedure Evaluation (Addendum)
Anesthesia Evaluation  Patient identified by MRN, date of birth, ID band Patient awake    Reviewed: Allergy & Precautions, NPO status , Patient's Chart, lab work & pertinent test results  Airway Mallampati: II  TM Distance: >3 FB Neck ROM: Full    Dental  (+) Edentulous Upper, Edentulous Lower   Pulmonary    Pulmonary exam normal        Cardiovascular hypertension, Pt. on medications Normal cardiovascular exam     Neuro/Psych    GI/Hepatic   Endo/Other  diabetes, Type 2, Oral Hypoglycemic Agents, Insulin Dependent  Renal/GU Renal InsufficiencyRenal disease     Musculoskeletal   Abdominal   Peds  Hematology   Anesthesia Other Findings   Reproductive/Obstetrics                            Anesthesia Physical Anesthesia Plan  ASA: III  Anesthesia Plan: MAC   Post-op Pain Management:    Induction: Intravenous  Airway Management Planned: Simple Face Mask  Additional Equipment:   Intra-op Plan:   Post-operative Plan:   Informed Consent: I have reviewed the patients History and Physical, chart, labs and discussed the procedure including the risks, benefits and alternatives for the proposed anesthesia with the patient or authorized representative who has indicated his/her understanding and acceptance.     Plan Discussed with: Surgeon and CRNA  Anesthesia Plan Comments:        Anesthesia Quick Evaluation

## 2017-02-05 NOTE — Op Note (Signed)
[facility] Patient Name: [name] Le Procedure Date : 02/05/2017 MRN: [id number] Attending MD: Estill Cotta. Loletha Carrow , MD Date of Birth: [birth year] CSN: [id number] Age: 71 Admit Type: Inpatient Procedure:                Colonoscopy Indications:              Unexplained iron deficiency anemia Providers:                Mallie Mussel L. Loletha Carrow, MD, Dortha Schwalbe RN, RN, Cletis Athens, Technician, Claybon Jabs CRNA, CRNA Referring MD:              Medicines:                Monitored Anesthesia Care Complications:            No immediate complications. Estimated Blood Loss:     Estimated blood loss: none. Procedure:                Pre-Anesthesia Assessment:                           - Prior to the procedure, a History and Physical                            was performed, and patient medications and                            allergies were reviewed. The patient's tolerance of                            previous anesthesia was also reviewed. The risks                            and benefits of the procedure and the sedation                            options and risks were discussed with the patient.                            All questions were answered, and informed consent                            was obtained. Anticoagulants: The patient has taken                            aspirin. It was decided not to withhold this                            medication prior to the procedure. ASA Grade                            Assessment: II - A patient with mild systemic  disease. After reviewing the risks and benefits,                            the patient was deemed in satisfactory condition to                            undergo the procedure.                           - Prior to the procedure, a History and Physical                            was performed, and patient medications and                            allergies were reviewed.  The patient's tolerance of                            previous anesthesia was also reviewed. The risks                            and benefits of the procedure and the sedation                            options and risks were discussed with the patient.                            All questions were answered, and informed consent                            was obtained. Anticoagulants: The patient has taken                            aspirin. It was decided not to withhold this                            medication prior to the procedure. ASA Grade                            Assessment: II - A patient with mild systemic                            disease. After reviewing the risks and benefits,                            the patient was deemed in satisfactory condition to                            undergo the procedure.                           After obtaining informed consent, the colonoscope  was passed under direct vision. Throughout the                            procedure, the patient's blood pressure, pulse, and                            oxygen saturations were monitored continuously. The                            EC-[id number] ([id number]) scope was introduced through                            the anus and advanced to the the cecum, identified                            by appendiceal orifice and ileocecal valve. The                            colonoscopy was performed without difficulty. The                            patient tolerated the procedure well. The quality                            of the bowel preparation was good. The ileocecal                            valve, appendiceal orifice, and rectum were                            photographed. The quality of the bowel preparation                            was evaluated using the BBPS [facility] Bowel                            Preparation Scale) with scores of: Right Colon = 2,                             Transverse Colon = 2 and Left Colon = 2. The total                            BBPS score equals 6. The bowel preparation used was                            MoviPrep. Scope In: 11:17:59 AM Scope Out: 11:29:17 AM Scope Withdrawal Time: 0 hours 7 minutes 5 seconds  Total Procedure Duration: 0 hours 11 minutes 18 seconds  Findings:      The perianal and digital rectal examinations were normal.      The entire examined colon appeared normal on direct and retroflexion       views. Impression:               - The  entire examined colon is normal on direct and                            retroflexion views.                           - No specimens collected. Moderate Sedation:      MAC sedation used Recommendation:           - Diabetic (ADA) diet.                           - See the other procedure note for documentation of                            additional recommendations.                           - No future routine (screening) colonoscopy due to                            age.                           - Continue present medications. Procedure Code(s):        --- Professional ---                           [phone], Colonoscopy, flexible; diagnostic, including                            collection of specimen(s) by brushing or washing,                            when performed (separate procedure) Diagnosis Code(s):        --- Professional ---                           D50.9, Iron deficiency anemia, unspecified CPT copyright 2016 American Medical Association. All rights reserved. The codes documented in this report are preliminary and upon coder review may  be revised to meet current compliance requirements. Brookelle Pellicane L. Loletha Carrow, MD 02/05/2017 11:48:47 AM This report has been signed electronically. Number of Addenda: 0

## 2017-02-05 NOTE — Interval H&P Note (Signed)
History and Physical Interval Note:  02/05/2017 10:51 AM  [name] Le  has presented today for surgery, with the diagnosis of symptomatic anemia  The various methods of treatment have been discussed with the patient and family. After consideration of risks, benefits and other options for treatment, the patient has consented to  Procedure(s): ESOPHAGOGASTRODUODENOSCOPY (EGD) WITH PROPOFOL (N/A) COLONOSCOPY WITH PROPOFOL (N/A) as a surgical intervention .  The patient's history has been reviewed, patient examined, no change in status, stable for surgery.  I have reviewed the patient's chart and labs.  Questions were answered to the patient's satisfaction.     Hemoglobin has responded well to transfusion, and she is prepped for procedures.   Nelida Meuse III

## 2017-02-05 NOTE — H&P (View-Only) (Signed)
Consultation  Referring Provider:     Linna Darner Primary Care Physician:  Harlan Stains, MD Primary Gastroenterologist:  Lyla Son  Reason for Consultation:     Symptomatic anemia         HPI:   [name] Le is a 71 y.o. female with a history of DM, HTN, HLD, who presented to the hospital complaining of hyperglycemia and left arm pain. Also noted to have worsening fatigue and weakness. On lab review she had a new anemia with Hgb of 7.5, MCV of 76, ferritin of 6, consistent with iron deficiency anemia. GI consulted to assist in management. Last Hgb known was 11.8  She reports taking a daily iron tablet, endorses chronically dark stools. She states she doesn't really notice a clear change in her bowel habits recently, has one BM per day. She denies any bright red blood per rectum. She has been taking one goody powder per day for her arm pain. She has periodic solid food dysphagia, no prior EGDs. She eats well, is gaining weight, not losing weight. She denies abdominal pains. Last colonoscopy in Sept 2007 which showed hemorrhoids and diverticulosis. No polyps. She denies FH of colon, gastric, or esophageal cancer. She denies other NSAID use.      Past Medical History:  Diagnosis Date  . Diabetes mellitus without complication ([place])   . High cholesterol   . Hypertension     Past Surgical History:  Procedure Laterality Date  . ABDOMINAL HYSTERECTOMY    . REPLACEMENT TOTAL KNEE Right     Family History  Problem Relation Age of Onset  . Stroke Mother   . Cancer Father        pts states cancer in back  . Lung cancer Sister   . Heart disease Sister   . Cancer Brother        "  . Colon cancer Neg Hx      Social History  Substance Use Topics  . Smoking status: Never Smoker  . Smokeless tobacco: Never Used  . Alcohol use Yes     Comment: wine occasional per pt.     Prior to Admission medications   Medication Sig Start Date End Date Taking? Authorizing Provider    albuterol (PROAIR HFA) 108 (90 BASE) MCG/ACT inhaler Inhale 2 puffs into the lungs every 4 (four) hours as needed for wheezing or shortness of breath. 07/15/15  Yes Parrett, Tammy S, NP  amLODipine (NORVASC) 5 MG tablet Take 5 mg by mouth daily. 10/02/15  Yes [provider]  aspirin 81 MG tablet Take 81 mg by mouth daily.   Yes [provider]  bisacodyl (DULCOLAX) 5 MG EC tablet Take 5 mg by mouth once. Per colon prep instructions-take as directed.   Yes [provider]  budesonide-formoterol (SYMBICORT) 160-4.5 MCG/ACT inhaler Inhale 2 puffs into the lungs 2 (two) times daily. 07/15/15  Yes Parrett, Tammy S, NP  iron polysaccharides (NIFEREX) 150 MG capsule Take 150 mg by mouth daily.   Yes [provider]  metFORMIN (GLUCOPHAGE-XR) 500 MG 24 hr tablet Take 500 mg by mouth daily.  05/15/14  Yes [provider]  NOVOLOG MIX 70/30 FLEXPEN (70-30) 100 UNIT/ML FlexPen Inject 48 Units into the skin daily with breakfast.  11/02/14  Yes [provider]  polyethylene glycol powder (MIRALAX) powder Take 1 Container by mouth daily. Per colon prep instructions-take as directed.   Yes [provider]  pravastatin (PRAVACHOL) 80 MG tablet TAKE 1  TABLET IN THE EVENING DAILY 02/28/15  Yes [provider]  valsartan (DIOVAN) 320 MG tablet Take 320 mg by mouth daily. 10/02/15  Yes [provider]    Current Facility-Administered Medications  Medication Dose Route Frequency Provider Last Rate Last Dose  . 0.9 %  sodium chloride infusion  10 mL/hr Intravenous Once Larene Pickett, PA-C      . acetaminophen (TYLENOL) tablet 650 mg  650 mg Oral Q6H PRN Waldemar Dickens, MD       Or  . acetaminophen (TYLENOL) suppository 650 mg  650 mg Rectal Q6H PRN Waldemar Dickens, MD      . amLODipine (NORVASC) tablet 5 mg  5 mg Oral Daily Waldemar Dickens, MD      . aspirin chewable tablet 81 mg  81 mg Oral Daily Waldemar Dickens, MD      . diclofenac  sodium (VOLTAREN) 1 % transdermal gel 4 g  4 g Topical QID PRN Waldemar Dickens, MD      . hydrALAZINE (APRESOLINE) injection 5-10 mg  5-10 mg Intravenous Q4H PRN Waldemar Dickens, MD   10 mg at 02/04/17 1221  . insulin aspart (novoLOG) injection 0-5 Units  0-5 Units Subcutaneous QHS Waldemar Dickens, MD      . insulin aspart (novoLOG) injection 0-9 Units  0-9 Units Subcutaneous TID WC Waldemar Dickens, MD      . insulin aspart protamine - aspart (NOVOLOG 70/30 MIX) FlexPen 24 Units  24 Units Subcutaneous BID WC Waldemar Dickens, MD      . irbesartan (AVAPRO) tablet 300 mg  300 mg Oral Daily Waldemar Dickens, MD      . iron polysaccharides (NIFEREX) capsule 150 mg  150 mg Oral Daily Waldemar Dickens, MD      . methocarbamol (ROBAXIN) 500 mg in dextrose 5 % 50 mL IVPB  500 mg Intravenous Q8H PRN Waldemar Dickens, MD      . mometasone-formoterol [facility]) 200-5 MCG/ACT inhaler 2 puff  2 puff Inhalation BID Waldemar Dickens, MD      . ondansetron [facility]) tablet 4 mg  4 mg Oral Q6H PRN Waldemar Dickens, MD       Or  . ondansetron [facility]) injection 4 mg  4 mg Intravenous Q6H PRN Waldemar Dickens, MD      . pantoprazole (PROTONIX) injection 40 mg  40 mg Intravenous Once Quincy Carnes M, PA-C      . pantoprazole (PROTONIX) injection 40 mg  40 mg Intravenous Q12H Waldemar Dickens, MD      . polyethylene glycol powder (GLYCOLAX/MIRALAX) container 255 g  1 Container Oral Daily Waldemar Dickens, MD      . pravastatin (PRAVACHOL) tablet 80 mg  80 mg Oral Daily Waldemar Dickens, MD       Current Outpatient Prescriptions  Medication Sig Dispense Refill  . albuterol (PROAIR HFA) 108 (90 BASE) MCG/ACT inhaler Inhale 2 puffs into the lungs every 4 (four) hours as needed for wheezing or shortness of breath. 1 Inhaler 5  . amLODipine (NORVASC) 5 MG tablet Take 5 mg by mouth daily.  5  . aspirin 81 MG tablet Take 81 mg by mouth daily.    . bisacodyl (DULCOLAX) 5 MG EC tablet Take 5 mg by mouth once. Per colon  prep instructions-take as directed.    . budesonide-formoterol (SYMBICORT) 160-4.5 MCG/ACT inhaler Inhale 2 puffs into the lungs 2 (two) times daily. 1 Inhaler  5  . iron polysaccharides (NIFEREX) 150 MG capsule Take 150 mg by mouth daily.    . metFORMIN (GLUCOPHAGE-XR) 500 MG 24 hr tablet Take 500 mg by mouth daily.   1  . NOVOLOG MIX 70/30 FLEXPEN (70-30) 100 UNIT/ML FlexPen Inject 48 Units into the skin daily with breakfast.   5  . polyethylene glycol powder (MIRALAX) powder Take 1 Container by mouth daily. Per colon prep instructions-take as directed.    . pravastatin (PRAVACHOL) 80 MG tablet TAKE 1 TABLET IN THE EVENING DAILY  5  . valsartan (DIOVAN) 320 MG tablet Take 320 mg by mouth daily.  5    Allergies as of 02/04/2017 - Review Complete 02/04/2017  Allergen Reaction Noted  . Ace inhibitors Cough 03/29/2015     Review of Systems:    As per HPI, otherwise negative    Physical Exam:  Vital signs in last 24 hours: Temp:  [98.5 F (36.9 C)] 98.5 F (36.9 C) (05/28 0827) Pulse Rate:  [63-72] 65 (05/28 1215) Resp:  [9-16] 13 (05/28 1215) BP: (169-215)/(77-87) 215/78 (05/28 1221) SpO2:  [94 %-100 %] 100 % (05/28 1215) Weight:  [189 lb (85.7 kg)] 189 lb (85.7 kg) (05/28 1610)   General:   Pleasant female in NAD Head:  Normocephalic and atraumatic. Eyes:   No icterus.   Conjunctiva pale. Lungs:  Respirations even and unlabored. Lungs clear to auscultation bilaterally.   No wheezes, crackles, or rhonchi.  Heart:  Regular rate and rhythm; no MRG Abdomen:  Soft, nondistended but protuberant, nontender. . No appreciable masses  Rectal:  Not performed.  Msk:  Symmetrical without gross deformities.  Extremities:  Without edema. Neurologic:  Alert and  oriented x4;  grossly normal neurologically. Skin:  Intact without significant lesions or rashes. Psych:  Alert and cooperative. Normal affect.  LAB RESULTS:  Recent Labs  02/04/17 0858  WBC 4.8  HGB 7.5*  HCT 25.6*  PLT 286    BMET  Recent Labs  02/04/17 0858  NA 142  K 3.5  CL 111  CO2 24  GLUCOSE 121*  BUN 18  CREATININE 0.87  CALCIUM 10.5*   LFT No results for input(s): PROT, ALBUMIN, AST, ALT, ALKPHOS, BILITOT, BILIDIR, IBILI in the last 72 hours. PT/INR No results for input(s): LABPROT, INR in the last 72 hours.  CBC Latest Ref Rng & Units 02/04/2017 12/01/2012 04/02/2011  WBC 4.0 - 10.5 K/uL 4.8 3.7(L) 7.4  Hemoglobin 12.0 - 15.0 g/dL 7.5(L) 11.8(L) 8.7(L)  Hematocrit 36.0 - 46.0 % 25.6(L) 35.5(L) 26.2(L)  Platelets 150 - 400 K/uL 286 247 181    Lab Results  Component Value Date   IRON 20 (L) 02/04/2017   TIBC 372 02/04/2017   FERRITIN 6 (L) 02/04/2017     STUDIES: Dg Chest 2 View  Result Date: 02/04/2017 CLINICAL DATA:  Left shoulder pain and chest pain. EXAM: CHEST  2 VIEW COMPARISON:  07/15/2015 FINDINGS: Mild cardiac enlargement. Mediastinal contours are within normal limits. Both lungs are clear. Scoliosis and degenerative disc disease is noted within the thoracic spine. IMPRESSION: No active cardiopulmonary disease. Electronically Signed   By: Kerby Moors M.D.   On: 02/04/2017 09:35   Dg Shoulder Left  Result Date: 02/04/2017 CLINICAL DATA:  Left shoulder pain common no known injury, initial encounter EXAM: LEFT SHOULDER - 2+ VIEW COMPARISON:  None. FINDINGS: There is no evidence of fracture or dislocation. There is no evidence of arthropathy or other focal bone abnormality. Soft tissues are unremarkable. IMPRESSION:  No acute abnormality noted. Electronically Signed   By: Inez Catalina M.D.   On: 02/04/2017 09:35      Impression / Plan:  71 y/o female with history as outlined above, who presented to the ER for arm pain / weakness, found to have symptomatic iron deficiency anemia. She endorses chronic dark stools while taking oral iron, but does admit to routine NSAID use. Last colonoscopy > 10 years ago. Mild dysphagia on ROS.  I discussed iron deficiency anemia with the  patient and husband, discussed differential and recommended initial evaluation with upper and lower endoscopy. I outlined risks / benefits of endoscopy and anesthesia with the patient and she wished to proceed.   She will receive PRBC transfusion today, and then plan for bowel prep for her procedures tomorrow. Clear liquid diet okay today. I would put her on empiric PPI until the endoscopy is done, although normal BUN argues against active upper GI bleeding.  Please call with questions in the interim, otherwise Dr. Loletha Carrow to assume her GI care tomorrow.   [place] Cellar, MD [facility] Gastroenterology Pager [phone]

## 2017-02-06 ENCOUNTER — Encounter (HOSPITAL_COMMUNITY): Payer: Self-pay | Admitting: Gastroenterology

## 2017-02-06 DIAGNOSIS — K922 Gastrointestinal hemorrhage, unspecified: Secondary | ICD-10-CM | POA: Diagnosis not present

## 2017-02-06 DIAGNOSIS — K558 Other vascular disorders of intestine: Secondary | ICD-10-CM | POA: Diagnosis not present

## 2017-02-06 DIAGNOSIS — E118 Type 2 diabetes mellitus with unspecified complications: Secondary | ICD-10-CM | POA: Diagnosis not present

## 2017-02-06 LAB — GLUCOSE, CAPILLARY
Glucose-Capillary: 130 mg/dL — ABNORMAL HIGH (ref 65–99)
Glucose-Capillary: 130 mg/dL — ABNORMAL HIGH (ref 65–99)

## 2017-02-06 LAB — CBC
HCT: 30.3 % — ABNORMAL LOW (ref 36.0–46.0)
Hemoglobin: 9.2 g/dL — ABNORMAL LOW (ref 12.0–15.0)
MCH: 23.4 pg — ABNORMAL LOW (ref 26.0–34.0)
MCHC: 30.4 g/dL (ref 30.0–36.0)
MCV: 76.9 fL — ABNORMAL LOW (ref 78.0–100.0)
Platelets: 251 10*3/uL (ref 150–400)
RBC: 3.94 MIL/uL (ref 3.87–5.11)
RDW: 17.8 % — ABNORMAL HIGH (ref 11.5–15.5)
WBC: 6.5 10*3/uL (ref 4.0–10.5)

## 2017-02-06 MED ORDER — POLYSACCHARIDE IRON COMPLEX 150 MG PO CAPS: 150.0000 mg | ORAL_CAPSULE | Freq: Two times a day (BID) | ORAL | Status: DC

## 2017-02-06 MED ORDER — PANTOPRAZOLE SODIUM 40 MG PO TBEC: 40.0000 mg | DELAYED_RELEASE_TABLET | Freq: Every day | ORAL | 0 refills | Status: DC

## 2017-02-06 MED ORDER — PANTOPRAZOLE SODIUM 40 MG PO TBEC
40.0000 mg | DELAYED_RELEASE_TABLET | Freq: Two times a day (BID) | ORAL | Status: DC
  Administered 2017-02-06: 40 mg via ORAL
  Filled 2017-02-06: qty 1

## 2017-02-06 MED ORDER — NOVOLOG MIX 70/30 FLEXPEN (70-30) 100 UNIT/ML ~~LOC~~ SUPN: 24.0000 [IU] | PEN_INJECTOR | Freq: Two times a day (BID) | SUBCUTANEOUS | Status: DC

## 2017-02-06 NOTE — Discharge Summary (Signed)
Physician Discharge Summary  [name] Le XBM:[id number] DOB: [birth year] DOA: 02/04/2017  PCP: Harlan Stains, MD  Admit date: 02/04/2017 Discharge date: 02/06/2017  Time spent: 35 minutes  Recommendations for Outpatient Follow-up:  1. Dr.Henry Danis 6/26 2. Dr.Cynthia White 1 week, please repeat CBC in 2weeks   Discharge Diagnoses:  Active Problems:   Essential hypertension   Hyperlipidemia   Iron deficiency anemia   Symptomatic anemia   GI bleed   Left shoulder pain   Diabetes mellitus with complication (HCC)   Iron deficiency anemia due to chronic blood loss   AVM (arteriovenous malformation) of small bowel, acquired [facility])   Discharge Condition: stable Diet recommendation: heart healthy  Filed Weights   02/04/17 0822 02/05/17 1028  Weight: 85.7 kg (189 lb) 85.7 kg (189 lb)    History of present illness:   [name] Le is a 71 y.o. female with medical history significant of diabetes, hypertension, hyperlipidemia. Patient presenting with a complaint of high sugar and left arm pain. Patient reports having difficulty controlling her sugars are in the last 2 weeks. Patient endorses left arm pain which started several months ago with worsening over the last 2 weeks. Patient using Goody's and nitroglycerin for this pain which seems to work. Denies any shortness of breath, palpitations, true chest pain associated with the symptoms. Patient also endorsing dark to black stools over the last 2 weeks.  Hospital Course:   Symptomatic anemia / GI bleed: Likely multifactorial secondary to chronic iron deficiency anemia in the setting of GI bleed. Hemoglobin 7.5. Difficult to determine baseline due to infrequent testing but approximately 9-11. Microcytic with an MCV of 76.2. Compliant on home iron. FOBT negative. - Frequent NSAID use. Pt noted dark tarry stools for 2 wks, [facility] consulted, and given 2units PRBC - started on PPI, underwent EGD which single non-bleeding angioectasia in  the duodenum but otherwise benign. Colonoscopy was normal. -Anemia panel with severe Iron defi, increased PO iron. -stable and discharge home in a stable condition -monitor Hb periodically, take PPI daily, FU with GI Dr.Danis and PCP  Diabetes:  -stable inpatient - continue 70/30.we divided dose to BID insteat of Qday  Left shoulder pain: Chronic and intermittent likely secondary to arthritis. Plain film as above. EKG and troponin reassuring. - Robaxin, PT eval completed  HTN:  -stable, continue Norvasc, Avapro  CAD: - Continue statin, ASA  Asthma: - Continue to dulera,  albuterol  Procedures:  EGD:      Impression: - Small hiatal hernia.                           - Benign-appearing esophageal stenosis.                           - Normal stomach.                           - A single non-bleeding angioectasia in the                            duodenum.                           - No specimens collected.   COLONOSCOPY: Impression: -The entire examined colon is normal on direct and  retroflexion views.                           - No specimens collected.  Consultations:  GI  Discharge Exam: Vitals:   02/05/17 2146 02/06/17 0523  BP: (!) 151/63 (!) 148/65  Pulse: 93 83  Resp: 20 18  Temp: 99.2 F (37.3 C) 98.6 F (37 C)    General: AAOx3 Cardiovascular: S1S2/RRR Respiratory: CTAB  Discharge Instructions   Discharge Instructions    Diet - low sodium heart healthy    Complete by:  As directed    Diet - low sodium heart healthy    Complete by:  As directed    Diet Carb Modified    Complete by:  As directed    Discharge instructions    Complete by:  As directed    Please follow up with PCP in one week and get your hemoglobin checked at that time.  Please follow up with Dr Loletha Carrow in 4 weeks as recommended.   Increase activity slowly    Complete by:  As directed      Current Discharge Medication List    START taking these  medications   Details  pantoprazole (PROTONIX) 40 MG tablet Take 1 tablet (40 mg total) by mouth daily. Qty: 30 tablet, Refills: 0      CONTINUE these medications which have CHANGED   Details  iron polysaccharides (NIFEREX) 150 MG capsule Take 1 capsule (150 mg total) by mouth 2 (two) times daily. With meals    NOVOLOG MIX 70/30 FLEXPEN (70-30) 100 UNIT/ML FlexPen Inject 0.24 mLs (24 Units total) into the skin 2 (two) times daily with a meal.      CONTINUE these medications which have NOT CHANGED   Details  albuterol (PROAIR HFA) 108 (90 BASE) MCG/ACT inhaler Inhale 2 puffs into the lungs every 4 (four) hours as needed for wheezing or shortness of breath. Qty: 1 Inhaler, Refills: 5    amLODipine (NORVASC) 5 MG tablet Take 5 mg by mouth daily. Refills: 5    aspirin 81 MG tablet Take 81 mg by mouth daily.    budesonide-formoterol (SYMBICORT) 160-4.5 MCG/ACT inhaler Inhale 2 puffs into the lungs 2 (two) times daily. Qty: 1 Inhaler, Refills: 5    metFORMIN (GLUCOPHAGE-XR) 500 MG 24 hr tablet Take 500 mg by mouth daily.  Refills: 1    polyethylene glycol powder (MIRALAX) powder Take 1 Container by mouth daily. Per colon prep instructions-take as directed.    pravastatin (PRAVACHOL) 80 MG tablet TAKE 1 TABLET IN THE EVENING DAILY Refills: 5    valsartan (DIOVAN) 320 MG tablet Take 320 mg by mouth daily. Refills: 5      STOP taking these medications     bisacodyl (DULCOLAX) 5 MG EC tablet        Allergies  Allergen Reactions  . Ace Inhibitors Cough   Follow-up Information    Doran Stabler, MD Follow up on 03/05/2017.   Specialty:  Gastroenterology Why:  10:30 AM to follow up with GI physician.   Contact information: [place] [address] [phone]        Harlan Stains, MD. Schedule an appointment as soon as possible for a visit in 1 week(s).   Specialty:  Family Medicine Contact information: [address] [place] [address] [phone]            The results of  significant diagnostics from this hospitalization (including imaging, microbiology, ancillary and laboratory) are listed below for reference.    Significant Diagnostic Studies: Dg Chest 2 View  Result Date: 02/04/2017 CLINICAL DATA:  Left shoulder pain and chest pain. EXAM: CHEST  2 VIEW COMPARISON:  07/15/2015 FINDINGS: Mild cardiac enlargement. Mediastinal contours are within normal limits. Both lungs are clear. Scoliosis and degenerative disc disease is noted within the thoracic spine. IMPRESSION: No active cardiopulmonary disease. Electronically Signed   By: Kerby Moors M.D.   On: 02/04/2017 09:35   Dg Shoulder Left  Result Date: 02/04/2017 CLINICAL DATA:  Left shoulder pain common no known injury, initial encounter EXAM: LEFT SHOULDER - 2+ VIEW COMPARISON:  None. FINDINGS: There is no evidence of fracture or dislocation. There is no evidence of arthropathy or other focal bone abnormality. Soft tissues are unremarkable. IMPRESSION: No acute abnormality noted. Electronically Signed   By: Inez Catalina M.D.   On: 02/04/2017 09:35    Microbiology: No results found for this or any previous visit (from the past 240 hour(s)).   Labs: Basic Metabolic Panel:  Recent Labs Lab 02/04/17 0858 02/05/17 0509 02/05/17 1606  NA 142 141 139  K 3.5 3.9 3.3*  CL 111 112* 111  CO2 24 21* 18*  GLUCOSE 121* 127* 190*  BUN 18 14 16   CREATININE 0.87 0.93 1.08*  CALCIUM 10.5* 10.2 10.1   Liver Function Tests: No results for input(s): AST, ALT, ALKPHOS, BILITOT, PROT, ALBUMIN in the last 168 hours. No results for input(s): LIPASE, AMYLASE in the last 168 hours. No results for input(s): AMMONIA in the last 168 hours. CBC:  Recent Labs Lab 02/04/17 0858 02/04/17 1659 02/05/17 0509 02/06/17 0433  WBC 4.8 4.9 5.7 6.5  NEUTROABS 2.9  --   --   --   HGB 7.5* 8.5* 10.3* 9.2*  HCT 25.6* 29.3* 33.5* 30.3*  MCV 76.2* 75.7*  76.8* 76.9*  PLT 286 300 272 251   Cardiac Enzymes: No results for input(s): CKTOTAL, CKMB, CKMBINDEX, TROPONINI in the last 168 hours. BNP: BNP (last 3 results) No results for input(s): BNP in the last 8760 hours.  ProBNP (last 3 results) No results for input(s): PROBNP in the last 8760 hours.  CBG:  Recent Labs Lab 02/05/17 1227 02/05/17 1708 02/05/17 2158 02/06/17 0801 02/06/17 1205  GLUCAP 77 204* 188* 130* 130*       SignedDomenic Polite MD.  [facility] 02/06/2017, 2:24 PM

## 2017-02-06 NOTE — Progress Notes (Signed)
Patient discharged to home with instructions. 

## 2017-02-06 NOTE — Care Management Note (Signed)
Case Management Note  Patient Details  Name: [name] Le MRN: [id number] Date of Birth: Jun 25, 1946  Subjective/Objective:                    Action/Plan:  No needs identified at present  Expected Discharge Date:  02/05/17               Expected Discharge Plan:  Home/Self Care  In-House Referral:     Discharge planning Services     Post Acute Care Choice:    Choice offered to:     DME Arranged:    DME Agency:     HH Arranged:    [place] Agency:     Status of Service:  In process, will continue to follow  If discussed at Long Length of Stay Meetings, dates discussed:    Additional Comments:  Marilu Favre, RN 02/06/2017, 11:33 AM

## 2017-02-06 NOTE — Care Management Obs Status (Signed)
[place] NOTIFICATION   Patient Details  Name: [name] Le MRN: [id number] Date of Birth: [birth year]   Medicare Observation Status Notification Given:  Yes    Erenest Rasher, RN 02/06/2017, 11:20 AM

## 2017-02-11 ENCOUNTER — Telehealth: Payer: Self-pay

## 2017-02-11 NOTE — Telephone Encounter (Signed)
SENT NOTES TO SCHEDULING 

## 2017-02-12 ENCOUNTER — Encounter: Payer: Self-pay | Admitting: Cardiovascular Disease

## 2017-02-26 NOTE — Progress Notes (Signed)
Cardiology Office Note   Date:  02/28/2017   ID:  Azula, Zappia 1945-12-21, MRN [id number]  PCP:  Harlan Stains, MD  Cardiologist:   Jenkins Rouge, MD   No chief complaint on file.     History of Present Illness: [name] Le is a 71 y.o. female who presents for evaluation of chest and arm pain. Referred by Dr Dema Severin CRF;s include DM, HTN and HL. Hospitalized end of last month with poorly controlled BS. Complained of left arm pain for several months worse over 2 weeks. No dyspnea, palpitations, central chest pain. 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 Associated with dyspnea but in setting of Hb 7   Past Medical History:  Diagnosis Date  . CKD (chronic kidney disease), stage III   . Diabetes mellitus without complication ([place])   . High cholesterol   . Hypertension   . Nontoxic goiter   . Symptomatic anemia 02/04/2017    Past Surgical History:  Procedure Laterality Date  . ABDOMINAL HYSTERECTOMY    . COLONOSCOPY WITH PROPOFOL N/A 02/05/2017   Procedure: COLONOSCOPY WITH PROPOFOL;  Surgeon: Doran Stabler, MD;  Location: [place] [facility];  Service: Endoscopy;  Laterality: N/A;  . ESOPHAGOGASTRODUODENOSCOPY (EGD) WITH PROPOFOL N/A 02/05/2017   Procedure: ESOPHAGOGASTRODUODENOSCOPY (EGD) WITH PROPOFOL;  Surgeon: Doran Stabler, MD;  Location: [place];  Service: Endoscopy;  Laterality: N/A;  . REPLACEMENT TOTAL KNEE Right      Current Outpatient Prescriptions  Medication Sig Dispense Refill  . amLODipine (NORVASC) 10 MG tablet Take 10 mg by mouth daily.  0   . aspirin 81 MG tablet Take 81 mg by mouth daily.    . furosemide (LASIX) 20 MG tablet Take 20 mg by mouth daily.  1  . metFORMIN (GLUCOPHAGE-XR) 500 MG 24 hr tablet Take 500 mg by mouth daily.   1  . metoprolol succinate (TOPROL-XL) 25 MG 24 hr tablet Take 25 mg by mouth daily.  1  . mometasone-formoterol (DULERA) 200-5 MCG/ACT AERO Inhale 2 puffs into the lungs 2 (two) times daily.    Marland Kitchen NOVOLOG MIX 70/30 FLEXPEN (70-30) 100 UNIT/ML FlexPen Inject 0.24 mLs (24 Units total) into the skin 2 (two) times daily with a meal.    . pantoprazole (PROTONIX) 40 MG tablet Take 1 tablet (40 mg total) by mouth daily. 30 tablet 0  . polyethylene glycol powder (MIRALAX) powder Take 1 Container by mouth daily. Per colon prep instructions-take as directed.    . pravastatin (PRAVACHOL) 80 MG tablet TAKE 1 TABLET IN THE EVENING DAILY  5  . Propylene Glycol (SYSTANE BALANCE) 0.6 % SOLN Apply 1 drop to eye daily as needed (dry eyes).    . valsartan (DIOVAN) 320 MG tablet Take 320 mg by mouth daily.  5   No current facility-administered medications for this visit.     Allergies:   Ace inhibitors    Social History:  The patient  reports that she has never smoked. She has never used smokeless tobacco. She reports that she drinks alcohol. She reports  that she does not use drugs.   Family History:  The patient's family history includes Cancer in her brother and father; Heart disease in her sister; Lung cancer in her sister; Stroke in her mother.    ROS:  Please see the history of present illness.   Otherwise, review of systems are positive for  .   All other systems are reviewed and negative.    PHYSICAL EXAM: VS:  BP (!) 144/87 (BP Location: Right Arm)   Pulse 70   Ht 5\' 6"  (1.676 m)   Wt 84.9 kg (187 lb 3.2 oz)   BMI 30.21 kg/m  , BMI Body mass index is 30.21 kg/m. Affect appropriate Healthy:  appears stated age 6: normal Neck supple with no adenopathy JVP normal no bruits no thyromegaly Lungs  clear with no wheezing and good diaphragmatic motion Heart:  S1/S2 no murmur, no rub, gallop or click PMI normal Abdomen: benighn, BS positve, no tenderness, no AAA no bruit.  No HSM or HJR Distal pulses intact with no bruits No edema Neuro non-focal Skin warm and dry No muscular weakness    EKG:   5/28  SR rate 66 normal other than PAC    Recent Labs: 02/05/2017: BUN 16; Creatinine, Ser 1.08; Potassium 3.3; Sodium 139 02/06/2017: Hemoglobin 9.2; Platelets 251    Lipid Panel No results found for: CHOL, TRIG, HDL, CHOLHDL, VLDL, LDLCALC, LDLDIRECT    Wt Readings from Last 3 Encounters:  02/28/17 84.9 kg (187 lb 3.2 oz)  02/05/17 85.7 kg (189 lb)  04/09/16 87.1 kg (192 lb)      Other studies Reviewed: Additional studies/ records that were reviewed today include: Hospital notes from admission May ECG labs And primary care notes DR Dema Severin .    ASSESSMENT AND PLAN:  1.  Chest/Arm pain atypical ? Ppt by anemia. Poorly controlled diabetic No obstructive CAD by cath In 2007 I think she will be ok with lexiscan ( CHB and sick with adenosine) can reverse with aminophylline if needed  2. HTN Well controlled.  Continue current medications and low sodium Dash type diet.   3. DM  Discussed low carb diet.  Target hemoglobin A1c is 6.5 or less.  Continue current medications. 4. HL continue statin labs with primary  5. GI bleed/Anemia  No melena feels stronger f/u GI 6. PAC;s  Asymptomatic Echo ok observe    Current medicines are reviewed at length with the patient today.  The patient does not have concerns regarding medicines.  The following changes have been made:  no change  Labs/ tests ordered today include: Lexiscan Myovue  No orders of the defined types were placed in this encounter.    Disposition:   FU with cardiology PRN      Signed, Jenkins Rouge, MD  02/28/2017 2:31 PM    [facility] [place], [place], [place]  [address] Phone: [phone]; Fax: [phone]

## 2017-02-28 ENCOUNTER — Encounter (INDEPENDENT_AMBULATORY_CARE_PROVIDER_SITE_OTHER): Payer: Self-pay

## 2017-02-28 ENCOUNTER — Encounter: Payer: Self-pay | Admitting: Cardiovascular Disease

## 2017-02-28 ENCOUNTER — Ambulatory Visit (INDEPENDENT_AMBULATORY_CARE_PROVIDER_SITE_OTHER): Payer: Medicare Other | Admitting: Cardiovascular Disease

## 2017-02-28 VITALS — BP 144/87 | HR 70 | Ht 66.0 in | Wt 187.2 lb

## 2017-02-28 DIAGNOSIS — R079 Chest pain, unspecified: Secondary | ICD-10-CM

## 2017-02-28 DIAGNOSIS — I10 Essential (primary) hypertension: Secondary | ICD-10-CM | POA: Diagnosis not present

## 2017-02-28 NOTE — Patient Instructions (Addendum)
Medication Instructions:  Your physician recommends that you continue on your current medications as directed. Please refer to the Current Medication list given to you today.  Labwork: NONE  Testing/Procedures: Your physician has requested that you have a lexiscan myoview. For further information please visit [email or web address]. Please follow instruction sheet, as given.  Follow-Up: Your physician wants you to follow-up as needed with Dr. Nishan.    If you need a refill on your cardiac medications before your next appointment, please call your pharmacy.    

## 2017-03-05 ENCOUNTER — Encounter: Payer: Self-pay | Admitting: Gastroenterology

## 2017-03-05 ENCOUNTER — Ambulatory Visit (INDEPENDENT_AMBULATORY_CARE_PROVIDER_SITE_OTHER): Payer: Medicare Other | Admitting: Gastroenterology

## 2017-03-05 VITALS — BP 144/78 | HR 64 | Ht 66.0 in | Wt 187.4 lb

## 2017-03-05 DIAGNOSIS — D500 Iron deficiency anemia secondary to blood loss (chronic): Secondary | ICD-10-CM

## 2017-03-05 DIAGNOSIS — K558 Other vascular disorders of intestine: Secondary | ICD-10-CM | POA: Diagnosis not present

## 2017-03-05 DIAGNOSIS — K5520 Angiodysplasia of colon without hemorrhage: Secondary | ICD-10-CM

## 2017-03-05 NOTE — Patient Instructions (Signed)
If you are age 72 or older, your body mass index should be between 23-30. Your Body mass index is 30.25 kg/m. If this is out of the aforementioned range listed, please consider follow up with your Primary Care Provider.  If you are age 15 or younger, your body mass index should be between 19-25. Your Body mass index is 30.25 kg/m. If this is out of the aformentioned range listed, please consider follow up with your Primary Care Provider.   Your physician has requested that you go to the basement for lab work before leaving today.  Thank you for choosing [facility].  Dr. Wilfrid Lund

## 2017-03-05 NOTE — Progress Notes (Addendum)
     [place] [facility] Progress Note  Chief Complaint: Iron deficiency anemia  Subjective  History:  This is 71 year old woman I recently saw during hospital consult for iron deficiency anemia. Colonoscopy was unrevealing, EGD showed nonbleeding 2 mm AVM in the second portion of the duodenum. I ablated it with APC. She was discharged home on oral iron and followed up with her primary care provider at [facility], Dr. Harlan Le. I'm afraid I do not have any recent office note from that encounter. [name] Le does not believe her blood counts of been checked since that visit a month ago. She denies black tarry stool now or prior to that hospital stay. She just saw her cardiologist last week who recommended a nuclear medicine stress test. [name] Le is not sure she wants to pursue this since she during a similar test years ago. She denies exertional chest pain.  ROS: Cardiovascular:  no chest pain Respiratory: no dyspnea  The patient's Past Medical, Family and Social History were reviewed and are on file in the EMR.  Objective:  Med list reviewed  Vital signs in last 24 hrs: Vitals:   03/05/17 1029  BP: (!) 144/78  Pulse: 64    Physical Exam    HEENT: sclera anicteric, oral mucosa moist without lesions  Neck: supple, no thyromegaly, JVD or lymphadenopathy  Cardiac: RRR without murmurs, S1S2 heard, no peripheral edema  Pulm: clear to auscultation bilaterally, normal RR and effort noted  Abdomen: soft, No tenderness, with active bowel sounds. No guarding or palpable hepatosplenomegaly.  Skin; warm and dry, no jaundice or rash  Recent Labs:   CBC Latest Ref Rng & Units 02/06/2017 02/05/2017 02/04/2017  WBC 4.0 - 10.5 K/uL 6.5 5.7 4.9  Hemoglobin 12.0 - 15.0 g/dL 9.2(L) 10.3(L) 8.5(L)  Hematocrit 36.0 - 46.0 % 30.3(L) 33.5(L) 29.3(L)  Platelets 150 - 400 K/uL 251 272 300     @ASSESSMENTPLANBEGIN @ Assessment: Encounter Diagnoses  Name Primary?  . Iron deficiency anemia due to  chronic blood loss Yes  . AVM (arteriovenous malformation) of small bowel, acquired ([place])    She appears to have iron deficiency anemia from chronic blood loss due to small bowel AVMs. There are most likely more AVMs in the distal small bowel. See my recent endoscopy reports for recommendations on the plan.   Plan: Check CBC and iron levels today. If not coming up sufficiently, we will make arrangements for IV iron treatments. If that is done, and is not able to sufficiently improve her blood counts, a video capsule study will be reconsidered. 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   Total time 20 minutes, over half spent in counseling and coordination of care.   [name] Le   Addendum:  Labs rec'd from PCP, Dr. Harlan Le 03/07/17  Hgb 11.1, so Hgb coming up well with iron since hospital discharge.   [name] Le

## 2017-03-25 ENCOUNTER — Telehealth (HOSPITAL_COMMUNITY): Payer: Self-pay | Admitting: *Deleted

## 2017-03-25 NOTE — Telephone Encounter (Signed)
Left message on voicemail per DPR in reference to upcoming appointment scheduled on 03/27/17 at 0945 with detailed instructions given per Myocardial Perfusion Study Information Sheet for the test. LM to arrive 15 minutes early, and that it is imperative to arrive on time for appointment to keep from having the test rescheduled. If you need to cancel or reschedule your appointment, please call the office within 24 hours of your appointment. Failure to do so may result in a cancellation of your appointment, and a $50 no show fee. Phone number given for call back for any questions.

## 2017-03-27 ENCOUNTER — Encounter (HOSPITAL_COMMUNITY): Payer: Medicare Other

## 2017-04-09 ENCOUNTER — Encounter: Payer: Self-pay | Admitting: Internal Medicine

## 2017-04-09 ENCOUNTER — Ambulatory Visit (INDEPENDENT_AMBULATORY_CARE_PROVIDER_SITE_OTHER): Payer: Medicare Other | Admitting: Internal Medicine

## 2017-04-09 VITALS — BP 182/84 | HR 80 | Wt 191.0 lb

## 2017-04-09 DIAGNOSIS — E213 Hyperparathyroidism, unspecified: Secondary | ICD-10-CM

## 2017-04-09 LAB — VITAMIN D 25 HYDROXY (VIT D DEFICIENCY, FRACTURES): VITD: 27.54 ng/mL — ABNORMAL LOW (ref 30.00–100.00)

## 2017-04-09 NOTE — Progress Notes (Signed)
Patient ID: [name] Le, female   DOB: 1946-03-21, 71 y.o.   MRN: [id number]   HPI  [name] Le is a 71 y.o.-year-old female, returning for f/u for Primary hyperparathyroidism. Last visit 1 year ago.  She had GIB 01/2017 >> admitted. Had a blood transfusion.  Reviewed and addended history:  Pt was dx with hypercalcemia in 2012, and was later diagnosed mild primary hyperparathyroidism. She adamantly refused a referral to surgery. She is returning on a yearly basis for labs and the clinical evaluation.  Lab Results  Component Value Date   PTH 64 04/09/2016   PTH Comment 04/09/2016   PTH 74 (H) 10/11/2015   CALCIUM 10.1 02/05/2017   CALCIUM 10.2 02/05/2017   CALCIUM 10.5 (H) 02/04/2017   CALCIUM 10.8 (H) 04/09/2016   CALCIUM 10.7 (H) 10/11/2015   CALCIUM 10.2 12/01/2012   CALCIUM 10.3 04/02/2011   CALCIUM 10.3 04/01/2011   CALCIUM 9.7 03/31/2011   CALCIUM 11.1 (H) 03/26/2011  01/24/2015: calcium 11.9,  GFR 46  She had a 24-hour urine calcium at the upper limit of normal: Component     Latest Ref Rng 10/17/2015  Calcium, Ur     Not estab mg/dL 17  Calcium, 24 hour urine     35 - 250 mg/24 h 247  Creatinine, Urine     20 - 320 mg/dL 146  Creatinine, 24H Ur     0.63 - 2.50 g/24 h 2.12    Of note, the patient had a Tc sestamibi scan (08/30/2014): Negative for parathyroid adenoma  No previous available DEXA scans. No fractures or falls.  No history of kidney stones.  She has a history of stage 3 CKD. Last BUN/Cr: Lab Results  Component Value Date   BUN 16 02/05/2017   CREATININE 1.08 (H) 02/05/2017   01/24/2015: BUN/creatinine: 27/1.38, GFR 46  She was previously on HCTZ, but we stopped this.  No history of vitamin D deficiency. Reviewed levels: Lab Results  Component Value Date   VD25OH 30.17 04/09/2016   VD25OH 30.37 10/11/2015   VD25OH 48.75 03/29/2015   Pt is not on Calcium, but is on vitamin D supplement 1000 units a day.  Pt does not have a FH of  hypercalcemia, pituitary tumors, thyroid cancer, or osteoporosis.   She also has a history of  DM2 - controlled, HTN, OA, HL, mild CAD  She also has a history of multinodular goiter s/p FNA 3 on 12/2013 with benign results.  Pt denies: - hoarseness - dysphagia - choking - SOB with lying down However, she tells me that she has pbs turning her neck. She also cannot sing >> had to get off the choir.  ROS: Constitutional: +weight loss, + fatigue, + hot flushes, + nocturia Eyes: +blurry vision, no xerophthalmia ENT: no sore throat, + see HPI Cardiovascular: + CP/+ SOB/no palpitations/no leg swelling Respiratory: + cough/+ SOB/no wheezing Gastrointestinal: no N/no V/no D/+ C/no acid reflux Musculoskeletal: no muscle aches/no joint aches Skin: no rashes, no hair loss Neurological: no tremors/no numbness/no tingling/no dizziness  I reviewed pt's medications, allergies, PMH, social hx, family hx, and changes were documented in the history of present illness. Otherwise, unchanged from my initial visit note.  Past Medical History:  Diagnosis Date  . CKD (chronic kidney disease), stage III   . Diabetes mellitus without complication ([place])   . High cholesterol   . Hypertension   . Nontoxic goiter   . Symptomatic anemia 02/04/2017   Past Surgical History:  Procedure Laterality  Date  . ABDOMINAL HYSTERECTOMY    . COLONOSCOPY WITH PROPOFOL N/A 02/05/2017   Procedure: COLONOSCOPY WITH PROPOFOL;  Surgeon: Doran Stabler, MD;  Location: [place];  Service: Endoscopy;  Laterality: N/A;  . ESOPHAGOGASTRODUODENOSCOPY (EGD) WITH PROPOFOL N/A 02/05/2017   Procedure: ESOPHAGOGASTRODUODENOSCOPY (EGD) WITH PROPOFOL;  Surgeon: Doran Stabler, MD;  Location: [place];  Service: Endoscopy;  Laterality: N/A;  . REPLACEMENT TOTAL KNEE Right    History   Social History  . Marital Status: Married    Spouse Name: N/A  . Number of Children: 1   Occupational History  . Retired  Geneticist, [employer])    Social History Main Topics  . Smoking status: Never Smoker   . Smokeless tobacco: Never Used  . Alcohol Use: Yes     Comment: ocassionally   . Drug Use: No     Comment: Previously smoked. Quit in 1981   Current Outpatient Prescriptions on File Prior to Visit  Medication Sig Dispense Refill  . amLODipine (NORVASC) 10 MG tablet Take 10 mg by mouth daily.  0  . aspirin 81 MG tablet Take 81 mg by mouth daily.    . furosemide (LASIX) 20 MG tablet Take 20 mg by mouth daily.  1  . metFORMIN (GLUCOPHAGE-XR) 500 MG 24 hr tablet Take 500 mg by mouth daily.   1  . metoprolol succinate (TOPROL-XL) 25 MG 24 hr tablet Take 25 mg by mouth daily.  1  . NOVOLOG MIX 70/30 FLEXPEN (70-30) 100 UNIT/ML FlexPen Inject 0.24 mLs (24 Units total) into the skin 2 (two) times daily with a meal.    . pantoprazole (PROTONIX) 40 MG tablet Take 1 tablet (40 mg total) by mouth daily. 30 tablet 0  . polyethylene glycol powder (MIRALAX) powder Take 1 Container by mouth daily. Per colon prep instructions-take as directed.    . pravastatin (PRAVACHOL) 80 MG tablet TAKE 1 TABLET IN THE EVENING DAILY  5  . Propylene Glycol (SYSTANE BALANCE) 0.6 % SOLN Apply 1 drop to eye daily as needed (dry eyes).    . valsartan (DIOVAN) 320 MG tablet Take 320 mg by mouth daily.  5  . mometasone-formoterol (DULERA) 200-5 MCG/ACT AERO Inhale 2 puffs into the lungs 2 (two) times daily.     No current facility-administered medications on file prior to visit.    Allergies  Allergen Reactions  . Ace Inhibitors Cough   Family History  Problem Relation Age of Onset  . Stroke Mother   . Cancer Father        pts states cancer in back  . Lung cancer Sister   . Heart disease Sister   . Cancer Brother        "  . Colon cancer Neg Hx   Also, diabetes and hypertension in sisters, nephews, nieces  PE: BP (!) 182/84 (BP Location: Left Arm, Patient Position: Sitting)   Pulse 80   Wt 191 lb (86.6 kg)   BMI 30.83 kg/m  Body  mass index is 30.83 kg/m. Wt Readings from Last 3 Encounters:  04/09/17 191 lb (86.6 kg)  03/05/17 187 lb 6.4 oz (85 kg)  02/28/17 187 lb 3.2 oz (84.9 kg)   Constitutional: overweight, in NAD Eyes: PERRLA, EOMI, no exophthalmos ENT: moist mucous membranes, + L>R thyromegaly, no cervical lymphadenopathy Cardiovascular: RRR, No MRG Respiratory: CTA B Gastrointestinal: abdomen soft, NT, ND, BS+ Musculoskeletal: no deformities, strength intact in all 4 Skin: moist, warm, no rashes Neurological: no tremor with outstretched  hands, DTR normal in all 4  Assessment: 1. Hypercalcemia/hyperparathyroidism  2. MNG  Plan: 1. Patient has a history of fluctuating calcium levels, with the highest being 11.9 (0.6-10.3). A corresponding intact PTH level was also high, per notes from PCP, however, I was not able to review this value. More recently, her highest calcium level was 10.8. At last visit, her PTH was in the normal range, however inappropriately normal. - She has no apparent complications from hypercalcemia: noh/o nephrolithiasis, no osteoporosis, no fractures. No abdominal pain, depression, bone pain. We checked a 24-hour urine for calcium and this was at the upper limit of normal. - At last visit, a vitamin D level >> normawe did not start supplements at that time, however, she now tells me that she is on 1000 units of vitamin D daily. We will recheck her vitamin D level now. -  of note, she had a normal technetium sestamibi scan, however, I still have suspicion for mild primary hyperparathyroidism, and we did discuss about surgery at previous visits and again today, and she refuses this. - today we will also check a PTH and a calcium level.  - I will see her back in a year  2. Multinodular goiter - Today I reviewed the report of her latest ultrasound of the thyroid from 2015. She had several large nodules, of which 3 were biopsied and the results were benign in 2015. - She was asymptomatic  from the goiter point of view, however, at this visit, she tells me that it is difficult for her to turn her head and also that she had to withdraw from the choir since she cannot maintain her notes.  - I again advised her to perform a new ultrasound and that she may need thyroidectomy.  -  she tells me that she has some traveling to do and will let me know when she returns to order the ultrasound.  - She is reticent about the surgery.  Component     Latest Ref Rng & Units 04/09/2017          Calcium     8.7 - 10.3 mg/dL 10.9 (H)  PTH, Intact     15 - 65 pg/mL 45  VITD     30.00 - 100.00 ng/mL 27.54 (L)   Mildly elevated calcium, with normal, nonsuppressed, PTH and slightly low vitamin D. She is currently on 1000 units of vitamin D daily, was doubled this amount. She refused a possible surgery for her parathyroid. We'll continue to keep an eye on the calcium and PTH.  Philemon Kingdom, MD PhD [facility]

## 2017-04-09 NOTE — Patient Instructions (Addendum)
Please stop at the lab.  Please let me know when you are ready for the thyroid U/S.  Please return in 1 year.

## 2017-04-10 LAB — PTH, INTACT AND CALCIUM
Calcium: 10.9 mg/dL — ABNORMAL HIGH (ref 8.7–10.3)
PTH: 45 pg/mL (ref 15–65)

## 2017-04-16 ENCOUNTER — Telehealth: Payer: Self-pay

## 2017-04-16 NOTE — Telephone Encounter (Signed)
Called patient and gave lab results. Patient had no questions or concerns.  

## 2017-04-16 NOTE — Telephone Encounter (Signed)
-----   Message from Philemon Kingdom, MD sent at 04/15/2017  5:32 PM EDT ----- [name] Le, can you please call pt: Mildly elevated calcium, with normal PTH and slightly low vitamin D. She is currently on 1000 units of vitamin D daily >> please advise her to will double this amount. We can continue to watch her calcium and parathyroid hormone levels for now.

## 2017-04-23 ENCOUNTER — Telehealth (HOSPITAL_COMMUNITY): Payer: Self-pay | Admitting: Cardiovascular Disease

## 2017-04-26 NOTE — Telephone Encounter (Signed)
User: [name] Le Date/time: 04/24/17 2:30 PM  Comment: Called pt and lmsg for her to CB to r/s myoview..pt was also called on 8/14 and lmsg as well..  Context: [place] Schedule Orders/Appt Requests Outcome: Left Message  Phone number: [phone] Phone Type: Home Phone  Comm. type: Telephone Call type: Outgoing  Contact: [name] Le Relation to patient: Self    User: [name] Le Date/time: 04/09/17 2:16 PM  Comment: Called pt and lmsg for her to CB to r/s myoview.   Context: [place] Schedule Orders/Appt Requests Outcome: Left Message  Phone number: [phone] Phone Type: Home Phone  Comm. type: Telephone Call type: Outgoing  Contact: [name] Le Relation to patient: Self

## 2017-06-27 ENCOUNTER — Telehealth: Payer: Self-pay | Admitting: Internal Medicine

## 2017-06-27 ENCOUNTER — Other Ambulatory Visit: Payer: Self-pay | Admitting: Family Medicine

## 2017-06-27 DIAGNOSIS — Z1231 Encounter for screening mammogram for malignant neoplasm of breast: Secondary | ICD-10-CM

## 2017-06-27 NOTE — Telephone Encounter (Signed)
Pt is an established pt but was referred for a new dx of DMT2--LM for pt to call back to schedule with Dr. Cruzita Lederer within the next 2 months for a 45 min appt

## 2017-08-06 ENCOUNTER — Encounter: Payer: Self-pay | Admitting: Internal Medicine

## 2017-08-06 ENCOUNTER — Ambulatory Visit (INDEPENDENT_AMBULATORY_CARE_PROVIDER_SITE_OTHER): Payer: Medicare Other | Admitting: Internal Medicine

## 2017-08-06 VITALS — BP 168/92 | HR 80 | Wt 192.0 lb

## 2017-08-06 DIAGNOSIS — E1121 Type 2 diabetes mellitus with diabetic nephropathy: Secondary | ICD-10-CM

## 2017-08-06 DIAGNOSIS — Z794 Long term (current) use of insulin: Secondary | ICD-10-CM | POA: Diagnosis not present

## 2017-08-06 DIAGNOSIS — E213 Hyperparathyroidism, unspecified: Secondary | ICD-10-CM

## 2017-08-06 LAB — POCT GLYCOSYLATED HEMOGLOBIN (HGB A1C): Hemoglobin A1C: 8.8

## 2017-08-06 MED ORDER — NOVOLOG MIX 70/30 FLEXPEN (70-30) 100 UNIT/ML ~~LOC~~ SUPN: PEN_INJECTOR | SUBCUTANEOUS | 11 refills | Status: DC

## 2017-08-06 MED ORDER — METFORMIN HCL ER 500 MG PO TB24: 500.0000 mg | ORAL_TABLET | Freq: Two times a day (BID) | ORAL | 3 refills | Status: DC

## 2017-08-06 NOTE — Patient Instructions (Signed)
Please increase: - Metformin ER to 500 mg 2x a day with meals  Please change Novolog 70/30 to: - 30 units before b'fast  - 20 units before dinner  The 70/30 insulin needs to be injected 30 min before you eat.  Please make b'fast and dinner you main meals.  Please return in 1.5 months with your sugar log.   PATIENT INSTRUCTIONS FOR TYPE 2 DIABETES:  **Please join MyChart!** - see attached instructions about how to join if you have not done so already.  DIET AND EXERCISE Diet and exercise is an important part of diabetic treatment.  We recommended aerobic exercise in the form of brisk walking (working between 40-60% of maximal aerobic capacity, similar to brisk walking) for 150 minutes per week (such as 30 minutes five days per week) along with 3 times per week performing 'resistance' training (using various gauge rubber tubes with handles) 5-10 exercises involving the major muscle groups (upper body, lower body and core) performing 10-15 repetitions (or near fatigue) each exercise. Start at half the above goal but build slowly to reach the above goals. If limited by weight, joint pain, or disability, we recommend daily walking in a swimming pool with water up to waist to reduce pressure from joints while allow for adequate exercise.    BLOOD GLUCOSES Monitoring your blood glucoses is important for continued management of your diabetes. Please check your blood glucoses 2-4 times a day: fasting, before meals and at bedtime (you can rotate these measurements - e.g. one day check before the 3 meals, the next day check before 2 of the meals and before bedtime, etc.).   HYPOGLYCEMIA (low blood sugar) Hypoglycemia is usually a reaction to not eating, exercising, or taking too much insulin/ other diabetes drugs.  Symptoms include tremors, sweating, hunger, confusion, headache, etc. Treat IMMEDIATELY with 15 grams of Carbs: . 4 glucose tablets .  cup regular juice/soda . 2 tablespoons  raisins . 4 teaspoons sugar . 1 tablespoon honey Recheck blood glucose in 15 mins and repeat above if still symptomatic/blood glucose <100.  RECOMMENDATIONS TO REDUCE YOUR RISK OF DIABETIC COMPLICATIONS: * Take your prescribed MEDICATION(S) * Follow a DIABETIC diet: Complex carbs, fiber rich foods, (monounsaturated and polyunsaturated) fats * AVOID saturated/trans fats, high fat foods, >2,300 mg salt per day. * EXERCISE at least 5 times a week for 30 minutes or preferably daily.  * DO NOT SMOKE OR DRINK more than 1 drink a day. * Check your FEET every day. Do not wear tightfitting shoes. Contact us if you develop an ulcer * See your EYE doctor once a year or more if needed * Get a FLU shot once a year * Get a PNEUMONIA vaccine once before and once after age 18 years  GOALS:  * Your Hemoglobin A1c of <7%  * fasting sugars need to be <130 * after meals sugars need to be <180 (2h after you start eating) * Your Systolic BP should be 235 or lower  * Your Diastolic BP should be 80 or lower  * Your HDL (Good Cholesterol) should be 40 or higher  * Your LDL (Bad Cholesterol) should be 100 or lower. * Your Triglycerides should be 150 or lower  * Your Urine microalbumin (kidney function) should be <30 * Your Body Mass Index should be 25 or lower    Please consider the following ways to cut down carbs and fat and increase fiber and micronutrients in your diet: - substitute whole grain for white bread  or pasta - substitute brown rice for white rice - substitute 90-calorie flat bread pieces for slices of bread when possible - substitute sweet potatoes or yams for white potatoes - substitute humus for margarine - substitute tofu for cheese when possible - substitute almond or rice milk for regular milk (would not drink soy milk daily due to concern for soy estrogen influence on breast cancer risk) - substitute dark chocolate for other sweets when possible - substitute water - can add lemon or  orange slices for taste - for diet sodas (artificial sweeteners will trick your body that you can eat sweets without getting calories and will lead you to overeating and weight gain in the long run) - do not skip breakfast or other meals (this will slow down the metabolism and will result in more weight gain over time)  - can try smoothies made from fruit and almond/rice milk in am instead of regular breakfast - can also try old-fashioned (not instant) oatmeal made with almond/rice milk in am - order the dressing on the side when eating salad at a restaurant (pour less than half of the dressing on the salad) - eat as little meat as possible - can try juicing, but should not forget that juicing will get rid of the fiber, so would alternate with eating raw veg./fruits or drinking smoothies - use as little oil as possible, even when using olive oil - can dress a salad with a mix of balsamic vinegar and lemon juice, for e.g. - use agave nectar, stevia sugar, or regular sugar rather than artificial sweateners - steam or broil/roast veggies  - snack on veggies/fruit/nuts (unsalted, preferably) when possible, rather than processed foods - reduce or eliminate aspartame in diet (it is in diet sodas, chewing gum, etc) Read the labels!  Try to read Dr. Janene Harvey book: "Program for Reversing Diabetes" for other ideas for healthy eating.

## 2017-08-06 NOTE — Progress Notes (Signed)
HPI: [name] Le is a 71 y.o.-year-old female, referred by her PCP, Dr.  Dema Severin, for management of DM2, dx in early 2000s, insulin-dependent since 2009, uncontrolled, with complications (CKD stage III, PN).  I have previously seen the patient for the hypercalcemia (primary hyperparathyroidism) and multinodular goiter.  This is a new appointment scheduled to address her diabetes.  "I don't know why I am here".  She has mild primary hyperparathyroidism, for which she refused surgery.  Of note, a technetium sestamibi parathyroid scan (08/2014)  was not localizing.  Her 24-hour urine calcium was at the upper limit of normal.  Latest vitamin D, calcium and parathyroid hormones were normal: Lab Results  Component Value Date   CALCIUM 10.9 (H) 04/09/2017   PTH 45 04/09/2017   PTH Comment 04/09/2017   Lab Results  Component Value Date   VD25OH 27.54 (L) 04/09/2017   She also has a history of multinodular goiter s/p FNA 3 on 12/2013 with benign results.  DM2: Last hemoglobin A1c was: 08/06/2017: HbA1c 8.8% No results found for: HGBA1C  Pt is on a regimen of: - Metformin ER 500 mg once a day in am - Novolog 70/30 25 units 2x a day, before meals >> 52 units in am  Pt checks her sugars 0-1x a day - not checked in last week: - am: 65-185 - 2h after b'fast: n/c - before lunch: n/c - 2h after lunch: n/c - before dinner: n/c - 2h after dinner: n/c - bedtime: n/c - nighttime: n/c No lows. Lowest sugar was 60s; she has hypoglycemia awareness at 60s.  Highest sugar was 225.  Glucometer: ?  Pt's meals are: - Brunch: 7 layer salad, ham, [place]; cereal; egg + bacon + apple butter + OJ - Dinner: sandwich with meat; bowl of jello - Snacks: icecream, cookies, potato chips - after dinner, at 12 am-1 am  - + mild CKD, last BUN/creatinine:  Lab Results  Component Value Date   BUN 16 02/05/2017   BUN 14 02/05/2017   CREATININE 1.08 (H) 02/05/2017   CREATININE 0.93 02/05/2017  On  Valsartan. - last set of lipids: No results found for: CHOL, HDL, LDLCALC, LDLDIRECT, TRIG, CHOLHDL  On Pravastatin - last eye exam was in 12/2015. No DR reportedly. 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  - no numbness, but + tingling in her feet.  Pt has no FH of DM.  ROS: Constitutional: no weight gain/loss, no fatigue, no subjective hyperthermia/hypothermia Eyes: no blurry vision, no xerophthalmia ENT: no sore throat, no nodules palpated in throat, no dysphagia/odynophagia, no hoarseness Cardiovascular: no CP/SOB/palpitations/leg swelling Respiratory: + cough/SOB Gastrointestinal: no N/V/D/C Musculoskeletal: no muscle/joint aches Skin: no rashes Neurological: no tremors/numbness/+ tingling/no dizziness Psychiatric: no depression/anxiety  Past Medical History:  Diagnosis Date  . CKD (chronic kidney disease), stage III   . Diabetes mellitus without complication ([place])   . High cholesterol   . Hypertension   . Nontoxic goiter   . Symptomatic anemia 02/04/2017   Past Surgical History:  Procedure Laterality Date  . ABDOMINAL HYSTERECTOMY    . COLONOSCOPY WITH PROPOFOL N/A 02/05/2017   Procedure: COLONOSCOPY WITH PROPOFOL;  Surgeon: Doran Stabler, MD;  Location: [facility];  Service: Endoscopy;  Laterality: N/A;  . ESOPHAGOGASTRODUODENOSCOPY (EGD) WITH PROPOFOL N/A 02/05/2017   Procedure: ESOPHAGOGASTRODUODENOSCOPY (EGD) WITH PROPOFOL;  Surgeon: Doran Stabler, MD;  Location: [facility];  Service: Endoscopy;  Laterality: N/A;  . REPLACEMENT TOTAL KNEE Right    Social History  Socioeconomic History  . Marital status: Married    Spouse name: Not on file  . Number of children: Not on file  . Years of education: Not on file  . Highest education level: Not on file  Social Needs  . Financial resource strain: Not on file  . Food insecurity - worry: Not on file  . Food insecurity - inability: Not on file  . Transportation needs - medical: Not on file  .  Transportation needs - non-medical: Not on file  Occupational History  . Occupation: retired  Tobacco Use  . Smoking status: Never Smoker  . Smokeless tobacco: Never Used  Substance and Sexual Activity  . Alcohol use: Yes    Comment: wine occasional per pt.   . Drug use: No    Comment: Previously smoked. Quit in 1981  . Sexual activity: Not on file  Other Topics Concern  . Not on file  Social History Narrative  . Not on file   Current Outpatient Medications on File Prior to Visit  Medication Sig Dispense Refill  . amLODipine (NORVASC) 10 MG tablet Take 10 mg by mouth daily.  0  . aspirin 81 MG tablet Take 81 mg by mouth daily.    . furosemide (LASIX) 20 MG tablet Take 20 mg by mouth daily.  1  . metFORMIN (GLUCOPHAGE-XR) 500 MG 24 hr tablet Take 500 mg by mouth daily.   1  . metoprolol succinate (TOPROL-XL) 25 MG 24 hr tablet Take 25 mg by mouth daily.  1  . mometasone-formoterol (DULERA) 200-5 MCG/ACT AERO Inhale 2 puffs into the lungs 2 (two) times daily.    Marland Kitchen NOVOLOG MIX 70/30 FLEXPEN (70-30) 100 UNIT/ML FlexPen Inject 0.24 mLs (24 Units total) into the skin 2 (two) times daily with a meal.    . pantoprazole (PROTONIX) 40 MG tablet Take 1 tablet (40 mg total) by mouth daily. 30 tablet 0  . polyethylene glycol powder (MIRALAX) powder Take 1 Container by mouth daily. Per colon prep instructions-take as directed.    . pravastatin (PRAVACHOL) 80 MG tablet TAKE 1 TABLET IN THE EVENING DAILY  5  . Propylene Glycol (SYSTANE BALANCE) 0.6 % SOLN Apply 1 drop to eye daily as needed (dry eyes).    . valsartan (DIOVAN) 320 MG tablet Take 320 mg by mouth daily.  5   No current facility-administered medications on file prior to visit.    Allergies  Allergen Reactions  . Ace Inhibitors Cough   Family History  Problem Relation Age of Onset  . Stroke Mother   . Cancer Father        pts states cancer in back  . Lung cancer Sister   . Heart disease Sister   . Cancer Brother        "  .  Colon cancer Neg Hx     PE: BP (!) 168/92   Pulse 80 Comment: nail are to long  Wt 192 lb (87.1 kg)   BMI 30.99 kg/m  Wt Readings from Last 3 Encounters:  08/06/17 192 lb (87.1 kg)  04/09/17 191 lb (86.6 kg)  03/05/17 187 lb 6.4 oz (85 kg)   Constitutional: overweight, in NAD Eyes: PERRLA, EOMI, no exophthalmos ENT: moist mucous membranes, +++ thyromegaly, no cervical lymphadenopathy Cardiovascular: RRR, No MRG Respiratory: CTA B Gastrointestinal: abdomen soft, NT, ND, BS+ Musculoskeletal: no deformities, strength intact in all 4 Skin: moist, warm, no rashes Neurological: no tremor with outstretched hands, DTR normal in all 4  ASSESSMENT:  1. DM2, insulin-dependent, uncontrolled, with complications - CKD stage 3  2. Primary  HPTH  3. MNG  PLAN:  1. Patient with long-standing, uncontrolled diabetes, on oral antidiabetic regimen with low-dose metformin  + once a day premixed insulin which became insufficient.  She was not initially aware why she is here. "I feel fine", but we discussed about the importance of good diabetes control and towards the end of the visit, she was engaged in trying to optimize her control. - She is not checking sugars consistently and when she does she only checks them in the morning, so is difficult to understand her CBG patterns.  An HbA1c obtained today was 8.8%, higher than target. -  At today's visit, we discussed about the importance of checking his sugars at least twice a day, before her main meals,  and we also discussed about increasing metformin to 500 mg twice a day for now and may even increase it further at next visit.  Regarding the premixed insulin, she is taking a larger dose in the morning, but we discussed that this is a twice a day insulin and will split the dose before breakfast/brunch and before dinner.  She is now trying to eat many small meals a day, however, I advised her that she may benefit from having a larger branch and a larger  dinner while on this insulin. - I suggested to:  Patient Instructions  Please increase: - Metformin ER to 500 mg 2x a day with meals  Please change Novolog 70/30 to: - 30 units before b'fast  - 20 units before dinner  The 70/30 insulin needs to be injected 30 min before you eat.  Please make b'fast and dinner you main meals.  Please return in 1.5 months with your sugar log.   - Strongly advised her to start checking sugars at different times of the day - check 2 times a day, rotating checks - given sugar log and advised how to fill it and to bring it at next appt  - given foot care handout and explained the principles  - given instructions for hypoglycemia management "15-15 rule"  - advised for yearly eye exams  - Return to clinic in 1.5 mo with sugar log   2. Primary  HPTH - Reviewed most recent calcium, PTH, vitamin D and these are normal.  As she refused surgery and her technetium sestamibi scan was nonlocalizing in the past, we can continue to follow her expectantly  3. MNG - Reviewed previous biopsies and these were negative - She has a large goiter, but no neck compression symptoms - Since she refuses surgery, will continue to follow her clinically  - time spent with the patient: 45 minutes, of which >50% was spent in obtaining information about her symptoms, reviewing her previous labs, evaluations, and treatments, counseling her about her diabetes (please see the discussed topics above), and developing a plan to further treat it.  Philemon Kingdom, MD PhD [facility] Endocrinology

## 2017-08-06 NOTE — Addendum Note (Signed)
Addended by: Drucilla Schmidt on: 08/06/2017 02:16 PM   Modules accepted: Orders

## 2017-10-04 ENCOUNTER — Encounter (HOSPITAL_COMMUNITY): Payer: Self-pay | Admitting: Emergency Medicine

## 2017-10-04 ENCOUNTER — Other Ambulatory Visit: Payer: Self-pay

## 2017-10-04 ENCOUNTER — Emergency Department (HOSPITAL_COMMUNITY): Payer: Medicare Other

## 2017-10-04 ENCOUNTER — Emergency Department (HOSPITAL_COMMUNITY)
Admission: EM | Admit: 2017-10-04 | Discharge: 2017-10-04 | Disposition: A | Discharge status: Home / Self Care | Payer: Medicare Other | Attending: Emergency Medicine | Admitting: Emergency Medicine

## 2017-10-04 DIAGNOSIS — E1122 Type 2 diabetes mellitus with diabetic chronic kidney disease: Secondary | ICD-10-CM | POA: Insufficient documentation

## 2017-10-04 DIAGNOSIS — Y999 Unspecified external cause status: Secondary | ICD-10-CM | POA: Insufficient documentation

## 2017-10-04 DIAGNOSIS — S0003XA Contusion of scalp, initial encounter: Secondary | ICD-10-CM

## 2017-10-04 DIAGNOSIS — Z794 Long term (current) use of insulin: Secondary | ICD-10-CM | POA: Insufficient documentation

## 2017-10-04 DIAGNOSIS — Y939 Activity, unspecified: Secondary | ICD-10-CM | POA: Insufficient documentation

## 2017-10-04 DIAGNOSIS — N183 Chronic kidney disease, stage 3 (moderate): Secondary | ICD-10-CM | POA: Insufficient documentation

## 2017-10-04 DIAGNOSIS — Z7982 Long term (current) use of aspirin: Secondary | ICD-10-CM | POA: Insufficient documentation

## 2017-10-04 DIAGNOSIS — Y929 Unspecified place or not applicable: Secondary | ICD-10-CM | POA: Insufficient documentation

## 2017-10-04 DIAGNOSIS — W228XXA Striking against or struck by other objects, initial encounter: Secondary | ICD-10-CM | POA: Diagnosis not present

## 2017-10-04 DIAGNOSIS — H5789 Other specified disorders of eye and adnexa: Secondary | ICD-10-CM

## 2017-10-04 DIAGNOSIS — I129 Hypertensive chronic kidney disease with stage 1 through stage 4 chronic kidney disease, or unspecified chronic kidney disease: Secondary | ICD-10-CM | POA: Insufficient documentation

## 2017-10-04 DIAGNOSIS — Z79899 Other long term (current) drug therapy: Secondary | ICD-10-CM | POA: Insufficient documentation

## 2017-10-04 DIAGNOSIS — S0990XA Unspecified injury of head, initial encounter: Secondary | ICD-10-CM | POA: Diagnosis present

## 2017-10-04 DIAGNOSIS — Z96652 Presence of left artificial knee joint: Secondary | ICD-10-CM | POA: Insufficient documentation

## 2017-10-04 LAB — CBG MONITORING, ED: Glucose-Capillary: 72 mg/dL (ref 65–99)

## 2017-10-04 NOTE — ED Notes (Signed)
Noted swelling an bruising to left peri-orbital area. Pt explained that she is not to get oob without staff assistance. Call bell orientation given and pt verbalizes understanding that this time.

## 2017-10-04 NOTE — ED Notes (Signed)
Pt ambulated to the with one assist and then independently returned to room. Pt with altered gait at baseline and is ambulating rapidly. MD made aware

## 2017-10-04 NOTE — ED Triage Notes (Signed)
Pt states she fell Tuesday morning around 4am and hit her head  Pt states she had a knot come up on the left side of her head but did not go to the hospital  Pt states she woke up this morning to go to the bathroom and the left side of her face was swollen

## 2017-10-04 NOTE — ED Provider Notes (Signed)
[facility] DEPT Provider Note   CSN: [id number] Arrival date & time: 10/04/17  0223     History   Chief Complaint Chief Complaint  Patient presents with  . Fall    HPI [name] Le is a 72 y.o. female.  HPI  72 year old female presents with facial swelling.  2 days ago she tripped and fell and hit her left scalp.  She does not think she lost consciousness.  She has had a large hematoma that has improved.  She is on an aspirin but no other blood thinners.  Denies a current headache.  When she went to the bathroom in the middle the night tonight she noticed facial swelling on the left side.  She denies any facial pain, dental pain, blurry vision, or ocular pain.  She has had some midline neck pain since the fall.  She denies any weakness in her extremities.  She has chronic gait instability that she states has been going on for months but is not new tonight.  No vomiting or chest pain.  Past Medical History:  Diagnosis Date  . CKD (chronic kidney disease), stage III ([place])   . Diabetes mellitus without complication ([place])   . High cholesterol   . Hypertension   . Nontoxic goiter   . Symptomatic anemia 02/04/2017    Patient Active Problem List   Diagnosis Date Noted  . Iron deficiency anemia due to chronic blood loss   . 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 Symptomatic anemia 02/04/2017  . Left shoulder pain 02/04/2017  . Diabetes mellitus with complication ([place]) 54/00/8676  . Chronic kidney disease, stage II (mild) 11/07/2015  . Chronic kidney disease, stage III (moderate) ([place]) 11/07/2015  . 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 Hyperlipidemia 11/07/2015  . Type 2 diabetes mellitus with diabetic nephropathy ([place]) 11/07/2015  . Hypertensive chronic kidney disease with stage 1 through stage 4 chronic kidney disease, or unspecified chronic kidney disease 11/07/2015  . Hyperparathyroidism ([place]) 10/14/2015  . Essential  hypertension 10/11/2015  . Hypercalcemia 03/29/2015  . Chronic cough 07/06/2014    Past Surgical History:  Procedure Laterality Date  . ABDOMINAL HYSTERECTOMY    . COLONOSCOPY WITH PROPOFOL N/A 02/05/2017   Procedure: COLONOSCOPY WITH PROPOFOL;  Surgeon: Doran Stabler, MD;  Location: [facility];  Service: Endoscopy;  Laterality: N/A;  . ESOPHAGOGASTRODUODENOSCOPY (EGD) WITH PROPOFOL N/A 02/05/2017   Procedure: ESOPHAGOGASTRODUODENOSCOPY (EGD) WITH PROPOFOL;  Surgeon: Doran Stabler, MD;  Location: [facility];  Service: Endoscopy;  Laterality: N/A;  . REPLACEMENT TOTAL KNEE Right     OB History    No data available       Home Medications    Prior to Admission medications   Medication Sig Start Date End Date Taking? Authorizing Provider  amLODipine (NORVASC) 10 MG tablet Take 10 mg by mouth daily. 01/31/17  Yes [provider]  aspirin 81 MG tablet Take 81 mg by mouth daily.   Yes [provider]  furosemide (LASIX) 20 MG tablet Take 20 mg by mouth daily. 02/20/17  Yes [provider]  metFORMIN (GLUCOPHAGE-XR) 500 MG 24 hr tablet Take 1 tablet (500 mg total) by mouth 2 (two) times daily with a meal. 08/06/17  Yes Philemon Kingdom, MD  metoprolol succinate (TOPROL-XL) 25 MG 24 hr tablet Take 25 mg by mouth daily. 02/18/17  Yes [provider]  mometasone-formoterol (DULERA) 200-5 MCG/ACT AERO Inhale 2 puffs into the lungs daily as needed  for wheezing or shortness of breath.    Yes [provider]  NOVOLOG MIX 70/30 FLEXPEN (70-30) 100 UNIT/ML FlexPen Inject under skin 30 min before b'fast and 20 units before dinner Patient taking differently: Inject under skin 25 units before b'fast and 25 units before dinner 08/06/17  Yes Philemon Kingdom, MD  pantoprazole (PROTONIX) 40 MG tablet Take 1 tablet (40 mg total) by mouth daily. 02/06/17  Yes Domenic Polite, MD  pravastatin (PRAVACHOL) 80 MG tablet TAKE 1 TABLET IN THE EVENING DAILY  02/28/15  Yes [provider]  Propylene Glycol (SYSTANE BALANCE) 0.6 % SOLN Apply 1 drop to eye daily as needed (dry eyes).   Yes [provider]  valsartan (DIOVAN) 320 MG tablet Take 320 mg by mouth daily. 10/02/15  Yes [provider]    Family History Family History  Problem Relation Age of Onset  . Stroke Mother   . Cancer Father        pts states cancer in back  . Lung cancer Sister   . Heart disease Sister   . Cancer Brother        "  . Colon cancer Neg Hx     Social History Social History   Tobacco Use  . Smoking status: Never Smoker  . Smokeless tobacco: Never Used  Substance Use Topics  . Alcohol use: Yes    Comment: wine occasional per pt.   . Drug use: No    Comment: Previously smoked. Quit in 1981     Allergies   Ace inhibitors   Review of Systems Review of Systems  Constitutional: Negative for fever.  HENT: Positive for facial swelling.   Respiratory: Negative for shortness of breath.   Cardiovascular: Negative for chest pain.  Gastrointestinal: Negative for vomiting.  Musculoskeletal: Positive for neck pain.  Neurological: Negative for weakness and headaches.  All other systems reviewed and are negative.    Physical Exam Updated Vital Signs BP (!) 178/87 (BP Location: Left Arm)   Pulse 78   Temp 97.8 F (36.6 C) (Oral)   Resp 20   SpO2 99%   Physical Exam  Constitutional: She is oriented to person, place, and time. She appears well-developed and well-nourished. No distress.  HENT:  Head: Normocephalic. Head is with contusion. Head is without laceration, without right periorbital erythema and without left periorbital erythema.    Right Ear: External ear normal.  Left Ear: External ear normal.  Nose: Nose normal.  Eyes: Right eye exhibits no discharge. Left eye exhibits no discharge.  Neck: Normal range of motion. Neck supple. Spinous process tenderness (mild) and muscular tenderness present.  Cardiovascular:  Normal rate, regular rhythm and normal heart sounds.  Pulmonary/Chest: Effort normal and breath sounds normal.  Abdominal: Soft. There is no tenderness.  Neurological: She is alert and oriented to person, place, and time.  CN 3-12 grossly intact. 5/5 strength in all 4 extremities. Grossly normal sensation. Normal finger to nose.   Skin: Skin is warm and dry. She is not diaphoretic.  Nursing note and vitals reviewed.    ED Treatments / Results  Labs (all labs ordered are listed, but only abnormal results are displayed) Labs Reviewed  CBG MONITORING, ED    EKG  EKG Interpretation None       Radiology Ct Head Wo Contrast  Result Date: 10/04/2017 CLINICAL DATA:  Patient fell on 10/01/2017. Subsequently noticed a knot on the left side of the head and now she has facial swelling. History of  goiter, diabetes. EXAM: CT HEAD WITHOUT CONTRAST CT MAXILLOFACIAL WITHOUT CONTRAST CT CERVICAL SPINE WITHOUT CONTRAST TECHNIQUE: Multidetector CT imaging of the head, cervical spine, and maxillofacial structures were performed using the standard protocol without intravenous contrast. Multiplanar CT image reconstructions of the cervical spine and maxillofacial structures were also generated. COMPARISON:  CT head 10/23/2006 FINDINGS: CT HEAD FINDINGS Brain: Patchy low-attenuation changes in the deep white matter consistent with small vessel ischemia. No significant atrophy or ventricular dilatation. No mass effect or midline shift. No abnormal extra-axial fluid collections. Gray-white matter junctions are distinct. Basal cisterns are not effaced. No acute intracranial hemorrhage. Vascular: No hyperdense vessel or unexpected calcification. Skull: Normal. Negative for fracture or focal lesion. Other: Large subcutaneous scalp hematoma over the left superior frontal region. CT MAXILLOFACIAL FINDINGS Osseous: No fracture or mandibular dislocation. No destructive process. Multiple prior tooth extractions. Orbits:  Globes and extraocular muscles appear intact and symmetrical. Mild left periorbital soft tissue hematoma. No retrobulbar extension. Sinuses: Mild mucosal thickening in the paranasal sinuses. No acute air-fluid levels. Mastoid air cells are not opacified. Soft tissues: Subcutaneous soft tissue infiltration in the fat over the left side of the face, likely representing contusion. No loculated fluid collection. CT CERVICAL SPINE FINDINGS Alignment: Normal alignment of the cervical vertebrae and facet joints. C1-2 articulation appears intact. Skull base and vertebrae: Skull base appears intact. No vertebral compression deformities. No focal bone lesion or bone destruction. Soft tissues and spinal canal: No prevertebral soft tissue swelling. No paraspinal soft tissue mass or infiltration. Disc levels: Degenerative changes throughout the cervical spine with narrowed interspaces and endplate hypertrophic changes. Degenerative changes throughout the cervical facet joints. Hypertrophic changes cause bone encroachment upon multiple neural foramina bilaterally. Upper chest: Lung apices are clear. Prominent enlargement of the thyroid gland with thyroid calcifications. This is consistent with the history of goiter. Other: None. IMPRESSION: 1. No acute intracranial abnormalities. Mild small vessel ischemic changes. Subcutaneous scalp hematoma on the left. 2. No acute orbital or facial fractures demonstrated. Mild periorbital and left facial soft tissue hematomas. 3. Normal alignment of the cervical spine. No acute displaced fractures. Degenerative changes throughout. 4. Diffuse enlargement of the thyroid gland consistent with known history of goiter. Electronically Signed   By: Lucienne Capers M.D.   On: 10/04/2017 04:18   Ct Cervical Spine Wo Contrast  Result Date: 10/04/2017 CLINICAL DATA:  Patient fell on 10/01/2017. Subsequently noticed a knot on the left side of the head and now she has facial swelling. History of  goiter, diabetes. EXAM: CT HEAD WITHOUT CONTRAST CT MAXILLOFACIAL WITHOUT CONTRAST CT CERVICAL SPINE WITHOUT CONTRAST TECHNIQUE: Multidetector CT imaging of the head, cervical spine, and maxillofacial structures were performed using the standard protocol without intravenous contrast. Multiplanar CT image reconstructions of the cervical spine and maxillofacial structures were also generated. COMPARISON:  CT head 10/23/2006 FINDINGS: CT HEAD FINDINGS Brain: Patchy low-attenuation changes in the deep white matter consistent with small vessel ischemia. No significant atrophy or ventricular dilatation. No mass effect or midline shift. No abnormal extra-axial fluid collections. Gray-white matter junctions are distinct. Basal cisterns are not effaced. No acute intracranial hemorrhage. Vascular: No hyperdense vessel or unexpected calcification. Skull: Normal. Negative for fracture or focal lesion. Other: Large subcutaneous scalp hematoma over the left superior frontal region. CT MAXILLOFACIAL FINDINGS Osseous: No fracture or mandibular dislocation. No destructive process. Multiple prior tooth extractions. Orbits: Globes and extraocular muscles appear intact and symmetrical. Mild left periorbital soft tissue hematoma. No retrobulbar extension. Sinuses: Mild mucosal thickening in  the paranasal sinuses. No acute air-fluid levels. Mastoid air cells are not opacified. Soft tissues: Subcutaneous soft tissue infiltration in the fat over the left side of the face, likely representing contusion. No loculated fluid collection. CT CERVICAL SPINE FINDINGS Alignment: Normal alignment of the cervical vertebrae and facet joints. C1-2 articulation appears intact. Skull base and vertebrae: Skull base appears intact. No vertebral compression deformities. No focal bone lesion or bone destruction. Soft tissues and spinal canal: No prevertebral soft tissue swelling. No paraspinal soft tissue mass or infiltration. Disc levels: Degenerative  changes throughout the cervical spine with narrowed interspaces and endplate hypertrophic changes. Degenerative changes throughout the cervical facet joints. Hypertrophic changes cause bone encroachment upon multiple neural foramina bilaterally. Upper chest: Lung apices are clear. Prominent enlargement of the thyroid gland with thyroid calcifications. This is consistent with the history of goiter. Other: None. IMPRESSION: 1. No acute intracranial abnormalities. Mild small vessel ischemic changes. Subcutaneous scalp hematoma on the left. 2. No acute orbital or facial fractures demonstrated. Mild periorbital and left facial soft tissue hematomas. 3. Normal alignment of the cervical spine. No acute displaced fractures. Degenerative changes throughout. 4. Diffuse enlargement of the thyroid gland consistent with known history of goiter. Electronically Signed   By: Lucienne Capers M.D.   On: 10/04/2017 04:18   Ct Maxillofacial Wo Contrast  Result Date: 10/04/2017 CLINICAL DATA:  Patient fell on 10/01/2017. Subsequently noticed a knot on the left side of the head and now she has facial swelling. History of goiter, diabetes. EXAM: CT HEAD WITHOUT CONTRAST CT MAXILLOFACIAL WITHOUT CONTRAST CT CERVICAL SPINE WITHOUT CONTRAST TECHNIQUE: Multidetector CT imaging of the head, cervical spine, and maxillofacial structures were performed using the standard protocol without intravenous contrast. Multiplanar CT image reconstructions of the cervical spine and maxillofacial structures were also generated. COMPARISON:  CT head 10/23/2006 FINDINGS: CT HEAD FINDINGS Brain: Patchy low-attenuation changes in the deep white matter consistent with small vessel ischemia. No significant atrophy or ventricular dilatation. No mass effect or midline shift. No abnormal extra-axial fluid collections. Gray-white matter junctions are distinct. Basal cisterns are not effaced. No acute intracranial hemorrhage. Vascular: No hyperdense vessel or  unexpected calcification. Skull: Normal. Negative for fracture or focal lesion. Other: Large subcutaneous scalp hematoma over the left superior frontal region. CT MAXILLOFACIAL FINDINGS Osseous: No fracture or mandibular dislocation. No destructive process. Multiple prior tooth extractions. Orbits: Globes and extraocular muscles appear intact and symmetrical. Mild left periorbital soft tissue hematoma. No retrobulbar extension. Sinuses: Mild mucosal thickening in the paranasal sinuses. No acute air-fluid levels. Mastoid air cells are not opacified. Soft tissues: Subcutaneous soft tissue infiltration in the fat over the left side of the face, likely representing contusion. No loculated fluid collection. CT CERVICAL SPINE FINDINGS Alignment: Normal alignment of the cervical vertebrae and facet joints. C1-2 articulation appears intact. Skull base and vertebrae: Skull base appears intact. No vertebral compression deformities. No focal bone lesion or bone destruction. Soft tissues and spinal canal: No prevertebral soft tissue swelling. No paraspinal soft tissue mass or infiltration. Disc levels: Degenerative changes throughout the cervical spine with narrowed interspaces and endplate hypertrophic changes. Degenerative changes throughout the cervical facet joints. Hypertrophic changes cause bone encroachment upon multiple neural foramina bilaterally. Upper chest: Lung apices are clear. Prominent enlargement of the thyroid gland with thyroid calcifications. This is consistent with the history of goiter. Other: None. IMPRESSION: 1. No acute intracranial abnormalities. Mild small vessel ischemic changes. Subcutaneous scalp hematoma on the left. 2. No acute orbital or facial fractures  demonstrated. Mild periorbital and left facial soft tissue hematomas. 3. Normal alignment of the cervical spine. No acute displaced fractures. Degenerative changes throughout. 4. Diffuse enlargement of the thyroid gland consistent with known  history of goiter. Electronically Signed   By: Lucienne Capers M.D.   On: 10/04/2017 04:18    Procedures Procedures (including critical care time)  Medications Ordered in ED Medications - No data to display   Initial Impression / Assessment and Plan / ED Course  I have reviewed the triage vital signs and the nursing notes.  Pertinent labs & imaging results that were available during my care of the patient were reviewed by me and considered in my medical decision making (see chart for details).     CT scans showed no acute pathology.  Does have some small hematoma and swelling around the left eye.  The periorbital swelling is probably related to equilibration of the parietal hematoma.  However no bony abnormalities or head bleed.  She is well-appearing.  While she does have some trouble ambulating, this is chronic and she is able to walk.  Discussed she should take her time walking in the future and possibly consider a cane.  Vital signs unremarkable besides hypertension.  Otherwise, neuro exam benign.  She appears stable for discharge home, ice as needed for the swelling.  Final Clinical Impressions(s) / ED Diagnoses   Final diagnoses:  Left parietal scalp hematoma, initial encounter  Periorbital swelling    ED Discharge Orders    None       Sherwood Gambler, MD 10/04/17 [phone]

## 2017-10-14 ENCOUNTER — Ambulatory Visit: Payer: Medicare Other | Admitting: Internal Medicine

## 2017-10-22 ENCOUNTER — Encounter: Payer: Self-pay | Admitting: Internal Medicine

## 2017-10-22 ENCOUNTER — Ambulatory Visit (INDEPENDENT_AMBULATORY_CARE_PROVIDER_SITE_OTHER): Payer: Medicare Other | Admitting: Internal Medicine

## 2017-10-22 VITALS — BP 162/76 | Ht 66.0 in | Wt 190.6 lb

## 2017-10-22 DIAGNOSIS — E049 Nontoxic goiter, unspecified: Secondary | ICD-10-CM | POA: Diagnosis not present

## 2017-10-22 DIAGNOSIS — E1121 Type 2 diabetes mellitus with diabetic nephropathy: Secondary | ICD-10-CM

## 2017-10-22 DIAGNOSIS — Z794 Long term (current) use of insulin: Secondary | ICD-10-CM | POA: Diagnosis not present

## 2017-10-22 DIAGNOSIS — E213 Hyperparathyroidism, unspecified: Secondary | ICD-10-CM

## 2017-10-22 LAB — POCT GLYCOSYLATED HEMOGLOBIN (HGB A1C): Hemoglobin A1C: 7.8

## 2017-10-22 NOTE — Patient Instructions (Addendum)
Please continue: - Metformin ER 500 mg 2x a day with meals  Please change: - Novolog 70/30 to: - 30 units before b'fast  - 20 units before dinner  Please check sugars 1-2x a day, at different times of the day and write them down.  Start vitamin D  5000 units every other day.  Please return in 3 months with your sugar log.

## 2017-10-22 NOTE — Progress Notes (Signed)
HPI: [name] Le is a 72 y.o.-year-old female, referred by her PCP, Dr.  Dema Severin, for management of DM2, dx in early 2000s, insulin-dependent since 2009, uncontrolled, with complications (CKD stage III, PN), hypercalcemia (primary hyperparathyroidism) and multinodular goiter.  Last visit 3 months ago.  She fell 3 weeks ago while going to the restroom at night >> hurt head. No fracture.   Mild primary hyperparathyroidism,  - She refused surgery - Of note, a technetium sestamibi parathyroid scan (08/2014) was not localizing.   - Her 24-hour urine calcium was at the upper limit of normal.    Reviewed pertinent labs: Lab Results  Component Value Date   CALCIUM 10.9 (H) 04/09/2017   PTH 45 04/09/2017   PTH Comment 04/09/2017   Vitamin D was slightly low: Lab Results  Component Value Date   VD25OH 27.54 (L) 04/09/2017  She is not on vitamin D.  She also has a history of multinodular goiter s/p FNA 3 In 12/2013: Benign  Pt denies: - feeling nodules in neck - hoarseness - dysphagia - choking - SOB with lying down  DM2: Last hemoglobin A1c was: Lab Results  Component Value Date   HGBA1C 8.8 08/06/2017  08/06/2017: HbA1c 8.8%  Patient was on: - Metformin ER 500 mg once a day in am - Novolog 70/30 25 units 2x a day, before meals >> 52 units in am  At last visit, we changed to: - Metformin ER 500 mg 2x a day with meals - Novolog 70/30 to: - 30 units before b'fast >> 25 (per instruction from the pharmacy...) - 20 units before dinner >> 25  Pt checks her sugars 1x a day: - am: 65-185 >> 70-110 - 2h after b'fast: n/c - before lunch: n/c - 2h after lunch: n/c - before dinner: n/c - 2h after dinner: n/c - bedtime: n/c - nighttime: n/c Lowest sugar was 60s >> 40s (around the time of the fall); she has hypoglycemia awareness at in the 60s. Highest sugar was 225 >> 110.  Glucometer: Accuchek Aviva  Pt's meals are: - Brunch: 7 layer salad, ham, [place]; cereal; egg + bacon  + apple butter + OJ - Dinner: sandwich with meat; bowl of jello - Snacks: icecream, cookies, potato chips - after dinner, at 12 am-1 am  -+ Mild CKD, last BUN/creatinine:  Lab Results  Component Value Date   BUN 16 02/05/2017   BUN 14 02/05/2017   CREATININE 1.08 (H) 02/05/2017   CREATININE 0.93 02/05/2017  O on valsartan. -+ HL; last set of lipids: No results found for: CHOL, HDL, LDLCALC, LDLDIRECT, TRIG, CHOLHDL  On pravastatin. - last eye exam was in 12/2015: Reportedly no DR. 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  - No numbness but has tingling in her feet  No family history of diabetes.  ROS: Constitutional: + weight gain/no weight loss, + fatigue, no subjective hyperthermia, no subjective hypothermia, + nocturia Eyes: + blurry vision, no xerophthalmia ENT: + sore throat,+ see HPI Cardiovascular: + CP/no SOB/no palpitations/no leg swelling Respiratory: + cough/no SOB/no wheezing Gastrointestinal: no N/no V/no D/no C/no acid reflux Musculoskeletal: no muscle aches/no joint aches Skin: no rashes, no hair loss Neurological: no tremors/no numbness/+ tingling/no dizziness, + HA  I reviewed pt's medications, allergies, PMH, social hx, family hx, and changes were documented in the history of present illness. Otherwise, unchanged from my initial visit note.  Past Medical History:  Diagnosis Date  . CKD (chronic kidney disease), stage III ([place])   . Diabetes  mellitus without complication ([place])   . High cholesterol   . Hypertension   . Nontoxic goiter   . Symptomatic anemia 02/04/2017   Past Surgical History:  Procedure Laterality Date  . ABDOMINAL HYSTERECTOMY    . COLONOSCOPY WITH PROPOFOL N/A 02/05/2017   Procedure: COLONOSCOPY WITH PROPOFOL;  Surgeon: Doran Stabler, MD;  Location: [place];  Service: Endoscopy;  Laterality: N/A;  . ESOPHAGOGASTRODUODENOSCOPY (EGD) WITH PROPOFOL N/A 02/05/2017   Procedure: ESOPHAGOGASTRODUODENOSCOPY (EGD) WITH PROPOFOL;   Surgeon: Doran Stabler, MD;  Location: [place];  Service: Endoscopy;  Laterality: N/A;  . REPLACEMENT TOTAL KNEE Right    Social History   Socioeconomic History  . Marital status: Married    Spouse name: Not on file  . Number of children: Not on file  . Years of education: Not on file  . Highest education level: Not on file  Social Needs  . Financial resource strain: Not on file  . Food insecurity - worry: Not on file  . Food insecurity - inability: Not on file  . Transportation needs - medical: Not on file  . Transportation needs - non-medical: Not on file  Occupational History  . Occupation: retired  Tobacco Use  . Smoking status: Never Smoker  . Smokeless tobacco: Never Used  Substance and Sexual Activity  . Alcohol use: Yes    Comment: wine occasional per pt.   . Drug use: No    Comment: Previously smoked. Quit in 1981  . Sexual activity: Not on file  Other Topics Concern  . Not on file  Social History Narrative  . Not on file   Current Outpatient Medications on File Prior to Visit  Medication Sig Dispense Refill  . amLODipine (NORVASC) 10 MG tablet Take 10 mg by mouth daily.  0  . aspirin 81 MG tablet Take 81 mg by mouth daily.    . furosemide (LASIX) 20 MG tablet Take 20 mg by mouth daily.  1  . metFORMIN (GLUCOPHAGE-XR) 500 MG 24 hr tablet Take 1 tablet (500 mg total) by mouth 2 (two) times daily with a meal. 180 tablet 3  . metoprolol succinate (TOPROL-XL) 25 MG 24 hr tablet Take 25 mg by mouth daily.  1  . mometasone-formoterol (DULERA) 200-5 MCG/ACT AERO Inhale 2 puffs into the lungs daily as needed for wheezing or shortness of breath.     Marland Kitchen NOVOLOG MIX 70/30 FLEXPEN (70-30) 100 UNIT/ML FlexPen Inject under skin 30 min before b'fast and 20 units before dinner (Patient taking differently: Inject under skin 25 units before b'fast and 25 units before dinner) 15 mL 11  . pantoprazole (PROTONIX) 40 MG tablet Take 1 tablet (40 mg total) by mouth daily. 30  tablet 0  . pravastatin (PRAVACHOL) 80 MG tablet TAKE 1 TABLET IN THE EVENING DAILY  5  . Propylene Glycol (SYSTANE BALANCE) 0.6 % SOLN Apply 1 drop to eye daily as needed (dry eyes).    . valsartan (DIOVAN) 320 MG tablet Take 320 mg by mouth daily.  5   No current facility-administered medications on file prior to visit.    Allergies  Allergen Reactions  . Ace Inhibitors Cough   Family History  Problem Relation Age of Onset  . Stroke Mother   . Cancer Father        pts states cancer in back  . Lung cancer Sister   . Heart disease Sister   . Cancer Brother        "  .  Colon cancer Neg Hx     PE: BP (!) 162/76 (BP Location: Left Arm, Patient Position: Sitting, Cuff Size: Normal)   Ht 5\' 6"  (1.676 m)   Wt 190 lb 9.6 oz (86.5 kg)   BMI 30.76 kg/m  Wt Readings from Last 3 Encounters:  10/22/17 190 lb 9.6 oz (86.5 kg)  08/06/17 192 lb (87.1 kg)  04/09/17 191 lb (86.6 kg)   Constitutional: overweight, in NAD Eyes: PERRLA, EOMI, no exophthalmos ENT: moist mucous membranes, +++ thyromegaly, no cervical lymphadenopathy Cardiovascular: RRR, No MRG Respiratory: CTA B Gastrointestinal: abdomen soft, NT, ND, BS+ Musculoskeletal: no deformities, strength intact in all 4 Skin: moist, warm, no rashes Neurological: no tremor with outstretched hands, DTR normal in all 4  ASSESSMENT: 1. DM2, insulin-dependent, uncontrolled, with complications - CKD stage 3  2. Primary  HPTH  3. MNG  PLAN:  1. Patient with long-standing, uncontrolled, type 2 diabetes, on oral antidiabetic regimen with low-dose metformin and premixed insulin.  At last visit, we increased both her 70/30 NovoLog insulin and her metformin from once a day to 2x a day.  She was not checking sugars before and when she did, she only checked in the morning, so we discussed about the importance of checking later in the day also.  An HbA1c obtained at last visit was 8.8%, too high.  We also discussed at that time about the  importance of eating her largest meals earlier in the day. - at this visit, sugars are much better, but she only checks in am >> again advised her to check later in the day. She did not use the 70/30 insulin doses I suggested at last visit >> we will increase the am insulin dose and decrease the pm one as sugars are low in am ad most likely they will increase during the day - I suggested to:  Patient Instructions  Please continue: - Metformin ER 500 mg 2x a day with meals  Please change: - Novolog 70/30 to: - 30 units before b'fast  - 20 units before dinner  Please check sugars 1-2x a day, at different times of the day and write them down.  Start vitamin D  5000 units every other day.  Please return in 3 months with your sugar log.   - today, HbA1c is 7.8% (better) - continue checking sugars at different times of the day - check 1-2x a day, rotating checks - advised for yearly eye exams >> she is UTD - Return to clinic in 3 mo with sugar log   2. Primary  HPTH - Reviewed her most recent calcium, PTH, vitamin D levels.  Calcium level was slightly high vitamin D level was slightly low. - She refused surgery and her technetium sestamibi scan was nonlocalizing in the past, we can continue to follow her expectantly. I advised her to take vitamin D 5000 IU (has this in her bag) >> every other day.  3. MNG - Reviewed previous biopsies and these were negative - She has a large goiter, but no neck compression symptoms - since she refuses surgery will continue to follow her expectantly  Philemon Kingdom, MD PhD [facility]

## 2018-02-13 ENCOUNTER — Ambulatory Visit: Payer: Medicare Other | Admitting: Internal Medicine

## 2018-02-13 DIAGNOSIS — E559 Vitamin D deficiency, unspecified: Secondary | ICD-10-CM | POA: Insufficient documentation

## 2018-02-13 NOTE — Progress Notes (Deleted)
HPI: [name] Le is a 72 y.o.-year-old female, returning for follow-up for DM2, dx in early 2000s, insulin-dependent since 2009, uncontrolled, with complications (CKD stage III, PN), hypercalcemia (primary hyperparathyroidism), vitamin D insufficiency, and multinodular goiter.  Last visit 4 months ago.  Mild primary hyperparathyroidism,  - Refused surgery - Of note, a technetium sestamibi parathyroid scan (08/2014) was not localizing  Reviewed pertinent labs: Lab Results  Component Value Date   CALCIUM 10.9 (H) 04/09/2017   PTH 45 04/09/2017   PTH Comment 04/09/2017   Her 24-hour urine calcium was at the upper limit of normal, while calcitriol level, magnesium, phosphorus, were normal: Component     Latest Ref Rng & Units 10/11/2015 10/17/2015  Vitamin D 1, 25 (OH) Total     18 - 72 pg/mL 70   Vitamin D3 1, 25 (OH)     pg/mL 70   Vitamin D2 1, 25 (OH)     pg/mL <8   Calcium, Ur     Not estab mg/dL  17  Calcium, 24 hour urine     35 - 250 mg/24 h  247  Creatinine, Urine     20 - 320 mg/dL  146  Creatinine, 24H Ur     0.63 - 2.50 g/24 h  2.12  Magnesium     1.5 - 2.5 mg/dL 2.0   Phosphorus     2.3 - 4.6 mg/dL 2.9    Vitamin D was slightly low: Lab Results  Component Value Date   VD25OH 27.54 (L) 04/09/2017   I advised her to start 5000 units vitamin D every other day at last visit.  Multinodular goiter  - s/p FNA 3 In 12/2013: Benign  Pt denies: - feeling nodules in neck - hoarseness - dysphagia - choking - SOB with lying down  DM2: Last hemoglobin A1c was: Lab Results  Component Value Date   HGBA1C 7.8 10/22/2017   HGBA1C 8.8 08/06/2017  08/06/2017: HbA1c 8.8%  She is on: - Metformin ER 500 mg 2x a day with meals - Novolog 70/30: - 25 >> 30 units before b'fast  - 20 units before dinner  Pt checks her sugars once a day: - am: 65-185 >> 70-110 - 2h after b'fast: n/c - before lunch: n/c - 2h after lunch: n/c - before dinner: n/c - 2h after dinner:  n/c - bedtime: n/c - nighttime: n/c Lowest sugar was 60s >> 40s (around the time of the fall) >> ***; she has hypoglycemia awareness in the 60s. Highest sugar was 225 >> 110 >> ***.  Glucometer: Accuchek Aviva  Pt's meals are: - Brunch: 7 layer salad, ham, [place]; cereal; egg + bacon + apple butter + OJ - Dinner: sandwich with meat; bowl of jello - Snacks: icecream, cookies, potato chips - after dinner, at 12 am-1 am  -  + MildCKD, last BUN/creatinine:  Lab Results  Component Value Date   BUN 16 02/05/2017   BUN 14 02/05/2017   CREATININE 1.08 (H) 02/05/2017   CREATININE 0.93 02/05/2017  On valsartan. - + HL; last set of lipids: No results found for: CHOL, HDL, LDLCALC, LDLDIRECT, TRIG, CHOLHDL  On pravastatin. - last eye exam was in 12/2015: Reportedly no DR. 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  - no numbness but has tingling in her feet  No family history of diabetes.  ROS: Constitutional: no weight gain/no weight loss, no fatigue, no subjective hyperthermia, no subjective hypothermia Eyes: no blurry vision, no xerophthalmia ENT: no sore throat, +  see HPI Cardiovascular: no CP/no SOB/no palpitations/no leg swelling Respiratory: no cough/no SOB/no wheezing Gastrointestinal: no N/no V/no D/no C/no acid reflux Musculoskeletal: no muscle aches/no joint aches Skin: no rashes, no hair loss Neurological: no tremors/no numbness/no tingling/no dizziness  I reviewed pt's medications, allergies, PMH, social hx, family hx, and changes were documented in the history of present illness. Otherwise, unchanged from my initial visit note.  Past Medical History:  Diagnosis Date  . CKD (chronic kidney disease), stage III ([place])   . Diabetes mellitus without complication ([place])   . High cholesterol   . Hypertension   . Nontoxic goiter   . Symptomatic anemia 02/04/2017   Past Surgical History:  Procedure Laterality Date  . ABDOMINAL HYSTERECTOMY    . COLONOSCOPY WITH PROPOFOL N/A  02/05/2017   Procedure: COLONOSCOPY WITH PROPOFOL;  Surgeon: Doran Stabler, MD;  Location: [place];  Service: Endoscopy;  Laterality: N/A;  . ESOPHAGOGASTRODUODENOSCOPY (EGD) WITH PROPOFOL N/A 02/05/2017   Procedure: ESOPHAGOGASTRODUODENOSCOPY (EGD) WITH PROPOFOL;  Surgeon: Doran Stabler, MD;  Location: [place];  Service: Endoscopy;  Laterality: N/A;  . REPLACEMENT TOTAL KNEE Right    Social History   Socioeconomic History  . Marital status: Married    Spouse name: Not on file  . Number of children: Not on file  . Years of education: Not on file  . Highest education level: Not on file  Occupational History  . Occupation: retired  Scientific laboratory technician  . Financial resource strain: Not on file  . Food insecurity:    Worry: Not on file    Inability: Not on file  . Transportation needs:    Medical: Not on file    Non-medical: Not on file  Tobacco Use  . Smoking status: Never Smoker  . Smokeless tobacco: Never Used  Substance and Sexual Activity  . Alcohol use: Yes    Comment: wine occasional per pt.   . Drug use: No    Types: Marijuana    Comment: Previously smoked. Quit in 1981  . Sexual activity: Not on file  Lifestyle  . Physical activity:    Days per week: Not on file    Minutes per session: Not on file  . Stress: Not on file  Relationships  . Social connections:    Talks on phone: Not on file    Gets together: Not on file    Attends religious service: Not on file    Active member of club or organization: Not on file    Attends meetings of clubs or organizations: Not on file    Relationship status: Not on file  . Intimate partner violence:    Fear of current or ex partner: Not on file    Emotionally abused: Not on file    Physically abused: Not on file    Forced sexual activity: Not on file  Other Topics Concern  . Not on file  Social History Narrative  . Not on file   Current Outpatient Medications on File Prior to Visit  Medication Sig Dispense  Refill  . amLODipine (NORVASC) 10 MG tablet Take 10 mg by mouth daily.  0  . aspirin 81 MG tablet Take 81 mg by mouth daily.    . furosemide (LASIX) 20 MG tablet Take 20 mg by mouth daily.  1  . metFORMIN (GLUCOPHAGE-XR) 500 MG 24 hr tablet Take 1 tablet (500 mg total) by mouth 2 (two) times daily with a meal. 180 tablet 3  . metoprolol  succinate (TOPROL-XL) 25 MG 24 hr tablet Take 25 mg by mouth daily.  1  . mometasone-formoterol (DULERA) 200-5 MCG/ACT AERO Inhale 2 puffs into the lungs daily as needed for wheezing or shortness of breath.     Marland Kitchen NOVOLOG MIX 70/30 FLEXPEN (70-30) 100 UNIT/ML FlexPen Inject under skin 30 min before b'fast and 20 units before dinner (Patient taking differently: Inject under skin 25 units before b'fast and 25 units before dinner) 15 mL 11  . pantoprazole (PROTONIX) 40 MG tablet Take 1 tablet (40 mg total) by mouth daily. 30 tablet 0  . pravastatin (PRAVACHOL) 80 MG tablet TAKE 1 TABLET IN THE EVENING DAILY  5  . Propylene Glycol (SYSTANE BALANCE) 0.6 % SOLN Apply 1 drop to eye daily as needed (dry eyes).    . valsartan (DIOVAN) 320 MG tablet Take 320 mg by mouth daily.  5   No current facility-administered medications on file prior to visit.    Allergies  Allergen Reactions  . Ace Inhibitors Cough   Family History  Problem Relation Age of Onset  . Stroke Mother   . Cancer Father        pts states cancer in back  . Lung cancer Sister   . Heart disease Sister   . Cancer Brother        "  . Colon cancer Neg Hx     PE: There were no vitals taken for this visit. Wt Readings from Last 3 Encounters:  10/22/17 190 lb 9.6 oz (86.5 kg)  08/06/17 192 lb (87.1 kg)  04/09/17 191 lb (86.6 kg)   Constitutional: overweight, in NAD Eyes: PERRLA, EOMI, no exophthalmos ENT: moist mucous membranes, +++ thyromegaly, no cervical lymphadenopathy Cardiovascular: RRR, No MRG Respiratory: CTA B Gastrointestinal: abdomen soft, NT, ND, BS+ Musculoskeletal: no  deformities, strength intact in all 4 Skin: moist, warm, no rashes Neurological: no tremor with outstretched hands, DTR normal in all 4  ASSESSMENT: 1. DM2, insulin-dependent, uncontrolled, with complications - CKD stage 3  2. Primary  HPTH  3. Vitamin D insufficiency  4. MNG  PLAN:  1. Patient with long-standing, uncontrolled, type 2 diabetes, on oral antidiabetic regimen (low-dose metformin) + premixed insulin.  At last visit, we increased her a.m. dose of 70/30 insulin, but overall, her sugars were much better.  She was again only checking sugars in the morning so I strongly advised her to start checking later in the day also.  At last visit, HbA1c was also improved, at 7.8%.  - I suggested to:  Patient Instructions  Please  continue: - Metformin ER 500 mg 2x a day with meals - Novolog 70/30: - 30 units before b'fast  - 20 units before dinner  Check sugars 2x a day and write them down.  Continue vitamin D 5000 units every other day.  Please return in 3 months with your sugar log.   - today, HbA1c is 7%  - continue checking sugars at different times of the day - check 2x a day, rotating checks - advised for yearly eye exams >> she is not UTD - Return to clinic in 3 mo with sugar log   2. And 3. Primary  HPTH and vitamin D insufficiency - reviewed her most recent calcium, PTH, vitamin D levels: Calcium level was slightly high and vitamin D level was slightly low.  I advised her to take 5000 units of vitamin D every other day. - her technetium sestamibi scan was nonlocalizing  - She refuses surgery  >>  we will continue to follow her expectantly.  4. MNG - reviewed previous biopsies and these were benign - She has a large goiter but no neck compression symptoms  - Since she refuses surgery, will continue to follow her expectantly  Philemon Kingdom, MD PhD [facility]

## 2018-04-29 ENCOUNTER — Ambulatory Visit: Payer: Medicare Other | Admitting: Internal Medicine

## 2018-06-10 ENCOUNTER — Other Ambulatory Visit: Payer: Self-pay | Admitting: Family Medicine

## 2018-06-10 DIAGNOSIS — R5381 Other malaise: Secondary | ICD-10-CM

## 2018-06-10 DIAGNOSIS — Z1231 Encounter for screening mammogram for malignant neoplasm of breast: Secondary | ICD-10-CM

## 2018-06-11 ENCOUNTER — Other Ambulatory Visit: Payer: Self-pay | Admitting: Family Medicine

## 2018-06-11 ENCOUNTER — Encounter: Payer: Self-pay | Admitting: Internal Medicine

## 2018-06-11 DIAGNOSIS — E2839 Other primary ovarian failure: Secondary | ICD-10-CM

## 2018-06-11 DIAGNOSIS — E213 Hyperparathyroidism, unspecified: Secondary | ICD-10-CM

## 2018-06-12 ENCOUNTER — Encounter: Payer: Self-pay | Admitting: Internal Medicine

## 2018-06-12 NOTE — Progress Notes (Signed)
Received labs from PCP - 06/10/2018: -Glucose 89, BUN/creatinine 21/1.19, EGFR 54 -Calcium 11.8, corrected 11.7 (8.6-10.3)  She refuses parathyroid sx.

## 2018-06-25 ENCOUNTER — Ambulatory Visit: Payer: Medicare Other | Admitting: Internal Medicine

## 2018-06-25 NOTE — Progress Notes (Deleted)
HPI: [name] Le is a 72 y.o.-year-old female, referred by her PCP, Dr.  Dema Severin, for management of DM2, dx in early 2000s, insulin-dependent since 2009, uncontrolled, with complications (CKD stage III, PN), hypercalcemia (primary hyperparathyroidism) and multinodular goiter.  Last visit 8 months ago.  Mild primary hyperparathyroidism,  -She refuses surgery - Of note, a technetium sestamibi parathyroid scan (08/2014) was not localizing -Her 24-hour urine calcium was at the upper limit of normal.  Reviewed pertinent labs: Lab Results  Component Value Date   CALCIUM 10.9 (H) 04/09/2017   PTH 45 04/09/2017   PTH Comment 04/09/2017   Vitamin D was slightly low at last check: Lab Results  Component Value Date   VD25OH 27.54 (L) 04/09/2017   At last visit, we started vitamin D 5000 units every other day.  Multinodular goiter s/p FNA 3 in 12/2013: Benign  Pt denies: - feeling nodules in neck - hoarseness - dysphagia - choking - SOB with lying down  DM2: Last hemoglobin A1c was: Lab Results  Component Value Date   HGBA1C 7.8 10/22/2017   HGBA1C 8.8 08/06/2017  08/06/2017: HbA1c 8.8%  Patient is  on: - Metformin ER 500 mg 2x a day with meals - Novolog 70/30 to: - 30 units before b'fast >> 25 >> 30 units - 20 units before dinner >> 25 >> 20 units  Pt checks her sugars once a day: - am: 65-185 >> 70-110 - 2h after b'fast: n/c - before lunch: n/c - 2h after lunch: n/c - before dinner: n/c - 2h after dinner: n/c - bedtime: n/c - nighttime: n/c Lowest sugar was 60s >> 40s >> ***; she has hypoglycemia awareness in the 60s. Highest sugar was 225 >> 110 >> ***.  Glucometer: Accuchek Aviva  Pt's meals are: - Brunch: 7 layer salad, ham, [place]; cereal; egg + bacon + apple butter + OJ - Dinner: sandwich with meat; bowl of jello - Snacks: icecream, cookies, potato chips - after dinner, at 12 am-1 am  -+ Mild CKD, last BUN/creatinine:  Lab Results  Component Value Date    BUN 16 02/05/2017   BUN 14 02/05/2017   CREATININE 1.08 (H) 02/05/2017   CREATININE 0.93 02/05/2017  On valsartan. -+ HL; lipids not available for review No results found for: CHOL, HDL, LDLCALC, LDLDIRECT, TRIG, CHOLHDL  On pravastatin. - last eye exam was in 12/2015: Reportedly no DR. 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  -No numbness but has tingling in her feet  No family history of diabetes.  ROS: Constitutional: no weight gain/no weight loss, no fatigue, no subjective hyperthermia, no subjective hypothermia Eyes: no blurry vision, no xerophthalmia ENT: no sore throat, + see HPI Cardiovascular: no CP/no SOB/no palpitations/no leg swelling Respiratory: no cough/no SOB/no wheezing Gastrointestinal: no N/no V/no D/no C/no acid reflux Musculoskeletal: no muscle aches/no joint aches Skin: no rashes, no hair loss Neurological: no tremors/no numbness/no tingling/no dizziness  I reviewed pt's medications, allergies, PMH, social hx, family hx, and changes were documented in the history of present illness. Otherwise, unchanged from my initial visit note.  Past Medical History:  Diagnosis Date  . CKD (chronic kidney disease), stage III ([place])   . Diabetes mellitus without complication ([place])   . High cholesterol   . Hypertension   . Nontoxic goiter   . Symptomatic anemia 02/04/2017   Past Surgical History:  Procedure Laterality Date  . ABDOMINAL HYSTERECTOMY    . COLONOSCOPY WITH PROPOFOL N/A 02/05/2017   Procedure: COLONOSCOPY WITH PROPOFOL;  Surgeon: Doran Stabler, MD;  Location: [facility] ENDOSCOPY;  Service: Endoscopy;  Laterality: N/A;  . ESOPHAGOGASTRODUODENOSCOPY (EGD) WITH PROPOFOL N/A 02/05/2017   Procedure: ESOPHAGOGASTRODUODENOSCOPY (EGD) WITH PROPOFOL;  Surgeon: Doran Stabler, MD;  Location: [place];  Service: Endoscopy;  Laterality: N/A;  . REPLACEMENT TOTAL KNEE Right    Social History   Socioeconomic History  . Marital status: Married    Spouse name:  Not on file  . Number of children: Not on file  . Years of education: Not on file  . Highest education level: Not on file  Occupational History  . Occupation: retired  Scientific laboratory technician  . Financial resource strain: Not on file  . Food insecurity:    Worry: Not on file    Inability: Not on file  . Transportation needs:    Medical: Not on file    Non-medical: Not on file  Tobacco Use  . Smoking status: Never Smoker  . Smokeless tobacco: Never Used  Substance and Sexual Activity  . Alcohol use: Yes    Comment: wine occasional per pt.   . Drug use: No    Types: Marijuana    Comment: Previously smoked. Quit in 1981  . Sexual activity: Not on file  Lifestyle  . Physical activity:    Days per week: Not on file    Minutes per session: Not on file  . Stress: Not on file  Relationships  . Social connections:    Talks on phone: Not on file    Gets together: Not on file    Attends religious service: Not on file    Active member of club or organization: Not on file    Attends meetings of clubs or organizations: Not on file    Relationship status: Not on file  . Intimate partner violence:    Fear of current or ex partner: Not on file    Emotionally abused: Not on file    Physically abused: Not on file    Forced sexual activity: Not on file  Other Topics Concern  . Not on file  Social History Narrative  . Not on file   Current Outpatient Medications on File Prior to Visit  Medication Sig Dispense Refill  . amLODipine (NORVASC) 10 MG tablet Take 10 mg by mouth daily.  0  . aspirin 81 MG tablet Take 81 mg by mouth daily.    . furosemide (LASIX) 20 MG tablet Take 20 mg by mouth daily.  1  . metFORMIN (GLUCOPHAGE-XR) 500 MG 24 hr tablet Take 1 tablet (500 mg total) by mouth 2 (two) times daily with a meal. 180 tablet 3  . metoprolol succinate (TOPROL-XL) 25 MG 24 hr tablet Take 25 mg by mouth daily.  1  . mometasone-formoterol (DULERA) 200-5 MCG/ACT AERO Inhale 2 puffs into the lungs  daily as needed for wheezing or shortness of breath.     Marland Kitchen NOVOLOG MIX 70/30 FLEXPEN (70-30) 100 UNIT/ML FlexPen Inject under skin 30 min before b'fast and 20 units before dinner (Patient taking differently: Inject under skin 25 units before b'fast and 25 units before dinner) 15 mL 11  . pantoprazole (PROTONIX) 40 MG tablet Take 1 tablet (40 mg total) by mouth daily. 30 tablet 0  . pravastatin (PRAVACHOL) 80 MG tablet TAKE 1 TABLET IN THE EVENING DAILY  5  . Propylene Glycol (SYSTANE BALANCE) 0.6 % SOLN Apply 1 drop to eye daily as needed (dry eyes).    . valsartan (DIOVAN) 320  MG tablet Take 320 mg by mouth daily.  5   No current facility-administered medications on file prior to visit.    Allergies  Allergen Reactions  . Ace Inhibitors Cough   Family History  Problem Relation Age of Onset  . Stroke Mother   . Cancer Father        pts states cancer in back  . Lung cancer Sister   . Heart disease Sister   . Cancer Brother        "  . Colon cancer Neg Hx     PE: There were no vitals taken for this visit. Wt Readings from Last 3 Encounters:  10/22/17 190 lb 9.6 oz (86.5 kg)  08/06/17 192 lb (87.1 kg)  04/09/17 191 lb (86.6 kg)   Constitutional: overweight, in NAD Eyes: PERRLA, EOMI, no exophthalmos ENT: moist mucous membranes,++ thyromegaly, no cervical lymphadenopathy Cardiovascular: RRR, No MRG Respiratory: CTA B Gastrointestinal: abdomen soft, NT, ND, BS+ Musculoskeletal: no deformities, strength intact in all 4 Skin: moist, warm, no rashes Neurological: no tremor with outstretched hands, DTR normal in all 4  ASSESSMENT: 1. DM2, insulin-dependent, uncontrolled, with complications - CKD stage 3  2. Primary  HPTH  3.  Vitamin D deficiency  4. MNG  5. HL  PLAN:  1. Patient with long-standing, uncontrolled, type 2 diabetes, on oral antidiabetic regimen (low-dose metformin) and also premixed insulin.  At last visit, sugars were much better but she was only taking  them in the morning so I advised her to also check sugars later in the day.  She did not increase the doses of 70/30 insulin as advised, so at last visit I advised her to increase the a.m. insulin dose and decrease the p.m. dose as her sugars were low in the morning and most likely they were increasing during the day  - I suggested to:  Patient Instructions  Please continue: - Metformin ER 500 mg 2x a day with meals - Novolog 70/30: - 30 units before b'fast  - 20 units before dinner  Check sugars 1-2x a day, at different times of the day and write them down.  Continue vitamin D  5000 units every other day.  Please return in 3 months with your sugar log.   - today, HbA1c is 7%  - continue checking sugars at different times of the day - check 2x a day, rotating checks - advised for yearly eye exams >> she is UTD - We will check a CMP today - Return to clinic in 3 mo with sugar log    2. Primary  HPTH and 3. vitamin D deficiency -Reviewed her most recent calcium, PTH, vitamin D levels.  Calcium level was slightly high, vitamin D level was slightly low -I advised her to take vitamin D 5000 units every other day (she already had this dose) -Her technetium sestamibi scan was nonlocalizing in the past -Since she refuses surgery, we will continue to follow her expectantly.  We will check calcium, PTH, vitamin D today  4. MNG -Reviewed previous biopsies and these were negative -She does have a large goiter, but without any compression symptoms -Since she refuses surgery, we will continue to follow her expectantly  5. HL - No lipid panel is available for review - Continues pravastatin without side effects. - We will check a lipid panel today   Philemon Kingdom, MD PhD [facility] Endocrinology

## 2018-06-27 ENCOUNTER — Encounter: Payer: Self-pay | Admitting: Internal Medicine

## 2018-06-27 ENCOUNTER — Ambulatory Visit (INDEPENDENT_AMBULATORY_CARE_PROVIDER_SITE_OTHER): Payer: Medicare Other | Admitting: Internal Medicine

## 2018-06-27 VITALS — BP 160/88 | HR 90 | Ht 66.0 in | Wt 188.0 lb

## 2018-06-27 DIAGNOSIS — E1121 Type 2 diabetes mellitus with diabetic nephropathy: Secondary | ICD-10-CM | POA: Diagnosis not present

## 2018-06-27 DIAGNOSIS — E7849 Other hyperlipidemia: Secondary | ICD-10-CM

## 2018-06-27 DIAGNOSIS — E559 Vitamin D deficiency, unspecified: Secondary | ICD-10-CM | POA: Diagnosis not present

## 2018-06-27 DIAGNOSIS — Z794 Long term (current) use of insulin: Secondary | ICD-10-CM

## 2018-06-27 DIAGNOSIS — E213 Hyperparathyroidism, unspecified: Secondary | ICD-10-CM

## 2018-06-27 LAB — POCT GLYCOSYLATED HEMOGLOBIN (HGB A1C): Hemoglobin A1C: 10.1 % — AB (ref 4.0–5.6)

## 2018-06-27 NOTE — Patient Instructions (Addendum)
Please bring the sugars log or your meter.  STOP EATING AT NIGHT.  Please continue: - Metformin ER 500 mg 2x a day with meals - Novolog 70/30: - 30 units before b'fast - 20 units before dinner  Please start vitamin D 2000 units daily.  Please return in 3 months with your sugar log.

## 2018-06-27 NOTE — Addendum Note (Signed)
Addended by: Cardell Peach I on: 06/27/2018 11:32 AM   Modules accepted: Orders

## 2018-06-27 NOTE — Progress Notes (Signed)
HPI: [name] Le is a 72 y.o.-year-old female, referred by her PCP, Dr.  Dema Severin, for management of DM2, dx in early 2000s, insulin-dependent since 2009, uncontrolled, with complications (CKD stage III, PN), hypercalcemia (primary hyperparathyroidism) and multinodular goiter.  Last visit 8 months ago.  Mild primary hyperparathyroidism,  -She continues to refuse surgery -Of note, a technetium sestamibi parathyroid scan (08/2014) was not localizing -Per 24-hour urine calcium was at the upper limit of normal.  Reviewed pertinent labs: Received labs from PCP - 06/10/2018: -Glucose 89, BUN/creatinine 21/1.19, EGFR 54 -Calcium 11.8, corrected 11.7 (8.6-10.3)  Previously: Lab Results  Component Value Date   CALCIUM 10.9 (H) 04/09/2017   PTH 45 04/09/2017   PTH Comment 04/09/2017   Vitamin D was slightly low at last check: Lab Results  Component Value Date   VD25OH 27.54 (L) 04/09/2017   At last visit, I advised her to start vitamin D 5000 units every other day. She tells me she takes a vitamin D every day but recently ran out- ? Dose.   Multinodular goiter s/p FNA 3 in 12/2013: Benign  Pt denies: - feeling nodules in neck - hoarseness - dysphagia - choking - SOB with lying down  DM2: Last hemoglobin A1c was: Lab Results  Component Value Date   HGBA1C 7.8 10/22/2017   HGBA1C 8.8 08/06/2017  08/06/2017: HbA1c 8.8%  Patient is  on: - Metformin ER 500 mg 2x a day with meals - Novolog 70/30 to: - 30 units before b'fast >> 25 >> 30 units - 20 units before dinner >> 25 >> 20 units  Pt checks her sugars once a day: - am: 65-185 >> 70-110 >> 157, 170-190 (eats at night - beets, grapes, ice cream) - 2h after b'fast: n/c - before lunch: n/c - 2h after lunch: n/c - before dinner: n/c - 2h after dinner: 150s - bedtime: n/c - nighttime: n/c Lowest sugar was 60s >> 40s >> 157; she has hypoglycemia awareness in the 60s. Highest sugar was 225 >> 110 >> 189.  Glucometer:  Accuchek Aviva  Pt's meals are: - Brunch: 7 layer salad, ham, [place]; cereal; egg + bacon + apple butter + OJ - Dinner: sandwich with meat; bowl of jello - Snacks: icecream, cookies, potato chips - after dinner, at 12 am-1 am  -+ Mild CKD, last BUN/creatinine:  Lab Results  Component Value Date   BUN 16 02/05/2017   BUN 14 02/05/2017   CREATININE 1.08 (H) 02/05/2017   CREATININE 0.93 02/05/2017  Stopped valsartan since last visit 2/2 dizziness. -+ History of hyperlipidemia; lipids not available for review - but checked by PCP reportedly >> will ask for records No results found for: CHOL, HDL, LDLCALC, LDLDIRECT, TRIG, CHOLHDL  On pravastatin. - last eye exam was in  2019: Reportedly no DR. 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 + glaucoma - No numbness but has tingling in her feet  No family history of diabetes.  ROS: Constitutional: no weight gain/no weight loss, no fatigue, no subjective hyperthermia, no subjective hypothermia Eyes: no blurry vision, no xerophthalmia ENT: no sore throat, + see HPI Cardiovascular: no CP/no SOB/no palpitations/no leg swelling Respiratory: no cough/no SOB/no wheezing Gastrointestinal: no N/no V/no D/no C/no acid reflux Musculoskeletal: no muscle aches/no joint aches Skin: no rashes, no hair loss Neurological: no tremors/no numbness/no tingling/no dizziness  I reviewed pt's medications, allergies, PMH, social hx, family hx, and changes were documented in the history of present illness. Otherwise, unchanged from my initial visit note.  Past Medical History:  Diagnosis Date  . CKD (chronic kidney disease), stage III ([place])   . Diabetes mellitus without complication ([place])   . High cholesterol   . Hypertension   . Nontoxic goiter   . Symptomatic anemia 02/04/2017   Past Surgical History:  Procedure Laterality Date  . ABDOMINAL HYSTERECTOMY    . COLONOSCOPY WITH PROPOFOL N/A 02/05/2017   Procedure: COLONOSCOPY WITH PROPOFOL;  Surgeon: Doran Stabler, MD;  Location: [place];  Service: Endoscopy;  Laterality: N/A;  . ESOPHAGOGASTRODUODENOSCOPY (EGD) WITH PROPOFOL N/A 02/05/2017   Procedure: ESOPHAGOGASTRODUODENOSCOPY (EGD) WITH PROPOFOL;  Surgeon: Doran Stabler, MD;  Location: [place];  Service: Endoscopy;  Laterality: N/A;  . REPLACEMENT TOTAL KNEE Right    Social History   Socioeconomic History  . Marital status: Married    Spouse name: Not on file  . Number of children: Not on file  . Years of education: Not on file  . Highest education level: Not on file  Occupational History  . Occupation: retired  Scientific laboratory technician  . Financial resource strain: Not on file  . Food insecurity:    Worry: Not on file    Inability: Not on file  . Transportation needs:    Medical: Not on file    Non-medical: Not on file  Tobacco Use  . Smoking status: Never Smoker  . Smokeless tobacco: Never Used  Substance and Sexual Activity  . Alcohol use: Yes    Comment: wine occasional per pt.   . Drug use: No    Types: Marijuana    Comment: Previously smoked. Quit in 1981  . Sexual activity: Not on file  Lifestyle  . Physical activity:    Days per week: Not on file    Minutes per session: Not on file  . Stress: Not on file  Relationships  . Social connections:    Talks on phone: Not on file    Gets together: Not on file    Attends religious service: Not on file    Active member of club or organization: Not on file    Attends meetings of clubs or organizations: Not on file    Relationship status: Not on file  . Intimate partner violence:    Fear of current or ex partner: Not on file    Emotionally abused: Not on file    Physically abused: Not on file    Forced sexual activity: Not on file  Other Topics Concern  . Not on file  Social History Narrative  . Not on file   Current Outpatient Medications on File Prior to Visit  Medication Sig Dispense Refill  . amLODipine (NORVASC) 10 MG tablet Take 10 mg by mouth  daily.  0  . aspirin 81 MG tablet Take 81 mg by mouth daily.    . furosemide (LASIX) 20 MG tablet Take 20 mg by mouth daily.  1  . metFORMIN (GLUCOPHAGE-XR) 500 MG 24 hr tablet Take 1 tablet (500 mg total) by mouth 2 (two) times daily with a meal. 180 tablet 3  . metoprolol succinate (TOPROL-XL) 25 MG 24 hr tablet Take 25 mg by mouth daily.  1  . mometasone-formoterol (DULERA) 200-5 MCG/ACT AERO Inhale 2 puffs into the lungs daily as needed for wheezing or shortness of breath.     Marland Kitchen NOVOLOG MIX 70/30 FLEXPEN (70-30) 100 UNIT/ML FlexPen Inject under skin 30 min before b'fast and 20 units before dinner (Patient taking differently: Inject under skin 25  units before b'fast and 25 units before dinner) 15 mL 11  . pantoprazole (PROTONIX) 40 MG tablet Take 1 tablet (40 mg total) by mouth daily. 30 tablet 0  . pravastatin (PRAVACHOL) 80 MG tablet TAKE 1 TABLET IN THE EVENING DAILY  5  . Propylene Glycol (SYSTANE BALANCE) 0.6 % SOLN Apply 1 drop to eye daily as needed (dry eyes).    . valsartan (DIOVAN) 320 MG tablet Take 320 mg by mouth daily.  5   No current facility-administered medications on file prior to visit.    Allergies  Allergen Reactions  . Ace Inhibitors Cough   Family History  Problem Relation Age of Onset  . Stroke Mother   . Cancer Father        pts states cancer in back  . Lung cancer Sister   . Heart disease Sister   . Cancer Brother        "  . Colon cancer Neg Hx     PE: BP (!) 160/88   Pulse 90   Ht '5\' 6"'$  (1.676 m) Comment: measured  Wt 188 lb (85.3 kg)   BMI 30.34 kg/m  Wt Readings from Last 3 Encounters:  06/27/18 188 lb (85.3 kg)  10/22/17 190 lb 9.6 oz (86.5 kg)  08/06/17 192 lb (87.1 kg)   Constitutional: overweight, in NAD Eyes: PERRLA, EOMI, no exophthalmos ENT: moist mucous membranes, ++ thyromegaly, no cervical lymphadenopathy Cardiovascular: RRR, No MRG Respiratory: CTA B Gastrointestinal: abdomen soft, NT, ND, BS+ Musculoskeletal: no deformities,  strength intact in all 4 Skin: moist, warm, no rashes Neurological: no tremor with outstretched hands, DTR normal in all 4  ASSESSMENT: 1. DM2, insulin-dependent, uncontrolled, with complications - CKD stage 3  2. Primary  HPTH  3.  Vitamin D deficiency  4. MNG  5. HL  PLAN:  1. Patient with long-standing, uncontrolled, type 2 diabetes, on oral antidiabetic regimen with low-dose metformin and also premixed insulin.  At last visit, sugars are much better but she was only taking them in the morning so I advised her to also start checking later in the day.  She did not increase the dose of 70/30 insulin as advised, so at last visit, I advised her to increase the a.m. insulin dose and decrease the p.m. dose as her sugars were low in the morning and most likely they were increasing during the day. - At this visit, sugars are much higher in the morning as she is eating at night.  I strongly advised her to stop doing so unless her sugars are low.  For now, I will continue the current insulin doses and also the same dose of metformin, but I strongly advised her to start checking sugars later in the day also, and bring her meter or log at next visit. - I suggested to:  Patient Instructions  Please bring the sugars log or your meter.  STOP EATING AT NIGHT.  Please continue: - Metformin ER 500 mg 2x a day with meals - Novolog 70/30: - 30 units before b'fast - 20 units before dinner  Please start vitamin D 2000 units daily.  Please return in 3 months with your sugar log.   - today, HbA1c is 10.1% (higher)  - Start checking sugars at different times of the day - check 2x a day, rotating checks - advised for yearly eye exams >> she is UTD - Return to clinic in 3 mo with sugar log    2. Primary  HPTH and  3. vitamin D deficiency  Reviewed her most recent calcium, PTH, vitamin D levels.  Calcium level was high at last check by [facility]PC, vitamin D level was slightly low  I advised her to take  vitamin D 5000 units every other day (she already had this dose) >> she took it daily but ran out\  Will start vitamin D 2000 units daily  Her technetium sestamibi scan was nonlocalizing in the past  I again suggested surgery, but she is very reticent to have this.  She will think about it and let me know at next visit, after her eyelid surgery.  4. MNG  Reviewed previous biopsies and these were negative  She does have a large goiter, but without any compression symptoms  We can continue to follow her expectantly  5. HL  No lipid panel is available for review >> need records  Continues pravastatin without side effects   Philemon Kingdom, MD PhD [facility] Endocrinology

## 2018-08-11 ENCOUNTER — Ambulatory Visit
Admission: RE | Admit: 2018-08-11 | Discharge: 2018-08-11 | Disposition: A | Discharge status: Home / Self Care | Payer: Medicare Other | Source: Ambulatory Visit | Attending: Family Medicine | Admitting: Family Medicine

## 2018-08-11 DIAGNOSIS — E2839 Other primary ovarian failure: Secondary | ICD-10-CM

## 2018-08-11 DIAGNOSIS — E213 Hyperparathyroidism, unspecified: Secondary | ICD-10-CM

## 2018-08-11 DIAGNOSIS — Z1231 Encounter for screening mammogram for malignant neoplasm of breast: Secondary | ICD-10-CM

## 2018-08-29 ENCOUNTER — Telehealth: Payer: Self-pay | Admitting: Internal Medicine

## 2018-08-29 NOTE — Telephone Encounter (Signed)
MEDICATION: Novolog Mix 70-30 Flex Pen  PHARMACY:  [facility] on [address]  IS THIS A 90 DAY SUPPLY : ?  IS PATIENT OUT OF MEDICATION: Yes-for 3 days  IF NOT; HOW MUCH IS LEFT: None  LAST APPOINTMENT DATE: @10 /18/2019  NEXT APPOINTMENT DATE:@2 /12/2018  DO WE HAVE YOUR PERMISSION TO LEAVE A DETAILED MESSAGE: Yes  OTHER COMMENTS:    **Let patient know to contact pharmacy at the end of the day to make sure medication is ready. **  ** Please notify patient to allow 48-72 hours to process**  **Encourage patient to contact the pharmacy for refills or they can request refills through [facility]**

## 2018-09-01 ENCOUNTER — Other Ambulatory Visit: Payer: Self-pay

## 2018-09-01 MED ORDER — NOVOLOG MIX 70/30 FLEXPEN (70-30) 100 UNIT/ML ~~LOC~~ SUPN: PEN_INJECTOR | SUBCUTANEOUS | 4 refills | Status: DC

## 2018-09-01 NOTE — Telephone Encounter (Signed)
This has been sent

## 2018-09-18 ENCOUNTER — Other Ambulatory Visit: Payer: Self-pay | Admitting: Internal Medicine

## 2018-10-14 ENCOUNTER — Ambulatory Visit (INDEPENDENT_AMBULATORY_CARE_PROVIDER_SITE_OTHER): Payer: Medicare Other | Admitting: Internal Medicine

## 2018-10-14 ENCOUNTER — Encounter: Payer: Self-pay | Admitting: Internal Medicine

## 2018-10-14 VITALS — BP 150/80 | HR 88 | Ht 66.0 in | Wt 185.0 lb

## 2018-10-14 DIAGNOSIS — E7849 Other hyperlipidemia: Secondary | ICD-10-CM

## 2018-10-14 DIAGNOSIS — E1121 Type 2 diabetes mellitus with diabetic nephropathy: Secondary | ICD-10-CM | POA: Diagnosis not present

## 2018-10-14 DIAGNOSIS — E049 Nontoxic goiter, unspecified: Secondary | ICD-10-CM | POA: Diagnosis not present

## 2018-10-14 DIAGNOSIS — E213 Hyperparathyroidism, unspecified: Secondary | ICD-10-CM | POA: Diagnosis not present

## 2018-10-14 DIAGNOSIS — Z794 Long term (current) use of insulin: Secondary | ICD-10-CM

## 2018-10-14 LAB — POCT GLYCOSYLATED HEMOGLOBIN (HGB A1C): Hemoglobin A1C: 8.7 % — AB (ref 4.0–5.6)

## 2018-10-14 NOTE — Patient Instructions (Addendum)
Please continue: - Metformin ER 500 mg 2x a day with meals  Please increase: - Novolog 70/30: - 30 units before b'fast - 25 units before dinner  Continue vitamin D 2000 units daily.  Please return in 3 months with your sugar log.

## 2018-10-14 NOTE — Progress Notes (Signed)
HPI: [name] Le is a 73 y.o.-year-old female, referred by her PCP, Dr.  Dema Severin, for management of DM2, dx in early 2000s, insulin-dependent since 2009, uncontrolled, with complications (CKD stage III, PN), hypercalcemia (primary hyperparathyroidism) and multinodular goiter.  Last visit 4.5 months ago.  She did not have palpebral sx yet as her eye pressure was too high.  Mild primary hyperparathyroidism,  -Refused surgery -Of note, a technetium sestamibi parathyroid scan (08/2014) was not localizing - 24-hour urine calcium was at the upper limit of normal.  Reviewed pertinent labs: Received labs from PCP - 06/10/2018: -Glucose 89, BUN/creatinine 21/1.19, EGFR 54 -Calcium 11.8, corrected 11.7 (8.6-10.3)  Previously: Lab Results  Component Value Date   CALCIUM 10.9 (H) 04/09/2017   PTH 45 04/09/2017   PTH Comment 04/09/2017   Latest vitamin D level was low: Lab Results  Component Value Date   VD25OH 27.54 (L) 04/09/2017   We restarted vitamin D 2000 units daily at last visit.  She tells me she is taking this daily.  Multinodular goiter s/p FNA x3 in 12/2013: Benign  Pt denies: - feeling nodules in neck - hoarseness - dysphagia, only occasional having dysphagia with 1 of her pills - choking - SOB with lying down  DM2: Last hemoglobin A1c was: Lab Results  Component Value Date   HGBA1C 10.1 (A) 06/27/2018   HGBA1C 7.8 10/22/2017   HGBA1C 8.8 08/06/2017  08/06/2017: HbA1c 8.8%  Patient is  on: - Metformin ER 500 mg 2x a day with meals - Novolog 70/30 to: - 30 units before b'fast >> 25 >> 30 units - 20 units before dinner >> 25 >> 20 units  Pt checks her sugars once a day -NovoLog and she cannot remember the values well: - am: 157, 170-190 (eats at night - beets, grapes, ice cream) >> 110, 150-160 - 2h after b'fast: n/c - before lunch: n/c - 2h after lunch: n/c - before dinner: n/c - 2h after dinner: 150s >> n/c - bedtime: n/c - nighttime: n/c Lowest sugar  was 40s >> 157 >> 110; she has hypoglycemia awareness in the 60s. Highest sugar was 110 >> 189 >> 160.  Glucometer: Accuchek Aviva  Pt's meals are: - Brunch: 7 layer salad, ham, [place]; cereal; egg + bacon + apple butter + OJ - Dinner: sandwich with meat; bowl of jello - Snacks: icecream, cookies, potato chips - after dinner, at 12 am-1 am  -+ Mild CKD, last BUN/creatinine:  06/10/2018: -Glucose 89, BUN/creatinine 21/1.19, EGFR 54 Lab Results  Component Value Date   BUN 16 02/05/2017   BUN 14 02/05/2017   CREATININE 1.08 (H) 02/05/2017   CREATININE 0.93 02/05/2017  Stopped losartan due to dizziness. -+ History of HL; lipids not available for review  No results found for: CHOL, HDL, LDLCALC, LDLDIRECT, TRIG, CHOLHDL  On pravastatin. 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  She also has glaucoma -No numbness but has tingling in her feet  No family history of diabetes.  ROS: Constitutional: no weight gain/no weight loss, no fatigue, no subjective hyperthermia, no subjective hypothermia Eyes: no blurry vision, no xerophthalmia ENT: no sore throat, + see HPI Cardiovascular: no CP/no SOB/no palpitations/no leg swelling Respiratory: no cough/no SOB/no wheezing Gastrointestinal: no N/no V/no D/no C/no acid reflux Musculoskeletal: no muscle aches/no joint aches Skin: no rashes, no hair loss Neurological: no tremors/no numbness/no tingling/no dizziness  I reviewed pt's medications, allergies, PMH, social hx, family hx, and changes  were documented in the history of present illness. Otherwise, unchanged from my initial visit note.  Past Medical History:  Diagnosis Date  . CKD (chronic kidney disease), stage III ([place])   . Diabetes mellitus without complication ([place])   . High cholesterol   . Hypertension   . Nontoxic goiter   . Symptomatic anemia 02/04/2017   Past Surgical History:  Procedure Laterality Date  . ABDOMINAL HYSTERECTOMY     . COLONOSCOPY WITH PROPOFOL N/A 02/05/2017   Procedure: COLONOSCOPY WITH PROPOFOL;  Surgeon: Doran Stabler, MD;  Location: [place];  Service: Endoscopy;  Laterality: N/A;  . ESOPHAGOGASTRODUODENOSCOPY (EGD) WITH PROPOFOL N/A 02/05/2017   Procedure: ESOPHAGOGASTRODUODENOSCOPY (EGD) WITH PROPOFOL;  Surgeon: Doran Stabler, MD;  Location: [place];  Service: Endoscopy;  Laterality: N/A;  . REPLACEMENT TOTAL KNEE Right    Social History   Socioeconomic History  . Marital status: Married    Spouse name: Not on file  . Number of children: Not on file  . Years of education: Not on file  . Highest education level: Not on file  Occupational History  . Occupation: retired  Scientific laboratory technician  . Financial resource strain: Not on file  . Food insecurity:    Worry: Not on file    Inability: Not on file  . Transportation needs:    Medical: Not on file    Non-medical: Not on file  Tobacco Use  . Smoking status: Never Smoker  . Smokeless tobacco: Never Used  Substance and Sexual Activity  . Alcohol use: Yes    Comment: wine occasional per pt.   . Drug use: No    Types: Marijuana    Comment: Previously smoked. Quit in 1981  . Sexual activity: Not on file  Lifestyle  . Physical activity:    Days per week: Not on file    Minutes per session: Not on file  . Stress: Not on file  Relationships  . Social connections:    Talks on phone: Not on file    Gets together: Not on file    Attends religious service: Not on file    Active member of club or organization: Not on file    Attends meetings of clubs or organizations: Not on file    Relationship status: Not on file  . Intimate partner violence:    Fear of current or ex partner: Not on file    Emotionally abused: Not on file    Physically abused: Not on file    Forced sexual activity: Not on file  Other Topics Concern  . Not on file  Social History Narrative  . Not on file   Current Outpatient Medications on File Prior to  Visit  Medication Sig Dispense Refill  . amLODipine (NORVASC) 10 MG tablet Take 10 mg by mouth daily.  0  . aspirin 81 MG tablet Take 81 mg by mouth daily.    . furosemide (LASIX) 20 MG tablet Take 20 mg by mouth daily.  1  . metFORMIN (GLUCOPHAGE-XR) 500 MG 24 hr tablet TAKE 1 TABLET (500 MG TOTAL) BY MOUTH 2 (TWO) TIMES DAILY WITH A MEAL. 180 tablet 3  . metoprolol succinate (TOPROL-XL) 25 MG 24 hr tablet Take 25 mg by mouth daily.  1  . mometasone-formoterol (DULERA) 200-5 MCG/ACT AERO Inhale 2 puffs into the lungs daily as needed for wheezing or shortness of breath.     Marland Kitchen NOVOLOG MIX 70/30 FLEXPEN (70-30) 100 UNIT/ML FlexPen Inject under skin  30 min before b'fast and 20 units before dinner 15 pen 4  . pantoprazole (PROTONIX) 40 MG tablet Take 1 tablet (40 mg total) by mouth daily. 30 tablet 0  . pravastatin (PRAVACHOL) 80 MG tablet TAKE 1 TABLET IN THE EVENING DAILY  5  . Propylene Glycol (SYSTANE BALANCE) 0.6 % SOLN Apply 1 drop to eye daily as needed (dry eyes).    . valsartan (DIOVAN) 320 MG tablet Take 320 mg by mouth daily.  5   No current facility-administered medications on file prior to visit.    Allergies  Allergen Reactions  . Ace Inhibitors Cough   Family History  Problem Relation Age of Onset  . Stroke Mother   . Cancer Father        pts states cancer in back  . Lung cancer Sister   . Heart disease Sister   . Cancer Brother        "  . Colon cancer Neg Hx     PE: BP (!) 150/80   Pulse 88   Ht _0  (1.676 m)   Wt 185 lb (83.9 kg)   SpO2 97%   BMI 29.86 kg/m  Wt Readings from Last 3 Encounters:  10/14/18 185 lb (83.9 kg)  06/27/18 188 lb (85.3 kg)  10/22/17 190 lb 9.6 oz (86.5 kg)   Constitutional: overweight, in NAD Eyes: PERRLA, EOMI, no exophthalmos ENT: moist mucous membranes, ++ thyromegaly, no cervical lymphadenopathy Cardiovascular: RRR, No MRG Respiratory: CTA B Gastrointestinal: abdomen soft, NT, ND, BS+ Musculoskeletal: no deformities,  strength intact in all 4 Skin: moist, warm, no rashes Neurological: no tremor with outstretched hands, DTR normal in all 4  ASSESSMENT: 1. DM2, insulin-dependent, uncontrolled, with complications - CKD stage 3  2. Primary  HPTH  3.  Vitamin D deficiency  4. MNG  5. HL  PLAN:  1. Patient with longstanding, uncontrolled, type 2 diabetes, on metformin and premixed insulin regimen.  At this visit, sugars are still high in the morning but she has problems remembering the values and does not bring a log or meter.  She is only checking in the morning.  I strongly advised her to also check some sugars later in the day, or at least rotating the check times. -For now, we will increase the insulin before dinner, but I would cannot make any changes in her breakfast insulin dose in the absence of CBGs later in the day. - I suggested to:  Patient Instructions  Please continue: - Metformin ER 500 mg 2x a day with meals  Please increase: - Novolog 70/30: - 30 units before b'fast - 25 units before dinner  Continue vitamin D 2000 units daily.  Please return in 3 months with your sugar log.   - today, HbA1c is 8.7% (better) - continue checking sugars at different times of the day - check 2x a day, rotating checks - advised for yearly eye exams >> she is UTD - Return to clinic in 3 mo with sugar log     2. Primary  HPTH and 3. vitamin D deficiency -Reviewed her most recent PTH, calcium, vitamin D level.  Calcium was high at last check, at 11.8. -At last visit she was out of her vitamin D and we restarted 2000 units daily as her vitamin D level was still slightly low. -Of note, her technetium sestamibi scan was nonlocalizing in the past -She refused surgery in the past, but I again advised her to try to think about it  since her calcium is higher.  4. MNG -Reviewed previous biopsies and these were negative -She does have a large goiter, but no compression symptoms -We can follow her  expectantly, although, I brought up the fact that we can do thyroidectomy at the time of her parathyroid surgery if she decides for it.  She does not appear to want to consider this.  5. HL -No lipid panel available for review-need records - Continues pravastatin without side effects.   Philemon Kingdom, MD PhD [facility] Endocrinology

## 2018-10-14 NOTE — Addendum Note (Signed)
Addended by: Cardell Peach I on: 10/14/2018 04:20 PM   Modules accepted: Orders

## 2019-01-16 ENCOUNTER — Ambulatory Visit (INDEPENDENT_AMBULATORY_CARE_PROVIDER_SITE_OTHER): Payer: Medicare Other | Admitting: Internal Medicine

## 2019-01-16 ENCOUNTER — Encounter: Payer: Self-pay | Admitting: Internal Medicine

## 2019-01-16 DIAGNOSIS — E213 Hyperparathyroidism, unspecified: Secondary | ICD-10-CM | POA: Diagnosis not present

## 2019-01-16 DIAGNOSIS — E559 Vitamin D deficiency, unspecified: Secondary | ICD-10-CM | POA: Diagnosis not present

## 2019-01-16 DIAGNOSIS — E1121 Type 2 diabetes mellitus with diabetic nephropathy: Secondary | ICD-10-CM

## 2019-01-16 DIAGNOSIS — Z794 Long term (current) use of insulin: Secondary | ICD-10-CM

## 2019-01-16 NOTE — Progress Notes (Signed)
Patient location: Home My location: Office  Referring Provider: Harlan Stains, MD  I connected with the patient on 01/16/19 at 1:09 PM by telephone and verified that I am speaking with the correct person.   I discussed the limitations of evaluation and management by telephone and the availability of in person appointments. The patient expressed understanding and agreed to proceed.   Details of the encounter are shown below.   HPI: [name] Le is a 73 y.o.-year-old female, presenting for follow-up for DM2, dx in early 2000s, insulin-dependent since 2009, uncontrolled, with complications (CKD stage III, PN), primary hyperparathyroidism, and multinodular goiter.  Last visit 3 months ago.  Mild primary hyperparathyroidism  -Refuses surgery -Of note, a technetium sestamibi parathyroid scan (08/2014) was not localizing -24-hour urine calcium was at the ULN  Reviewed pertinent labs: Received labs from PCP - 06/10/2018: -Glucose 89, BUN/creatinine 21/1.19, EGFR 54 -Calcium 11.8, corrected 11.7 (8.6-10.3)  Previously: Lab Results  Component Value Date   CALCIUM 10.9 (H) 04/09/2017   PTH 45 04/09/2017   PTH Comment 04/09/2017   Latest vitamin D level was low: Lab Results  Component Value Date   VD25OH 27.54 (L) 04/09/2017   She is supposed on 2000 units vitamin D daily.  We did not check her levels at last visit that she was out of the supplement. She is now off the supplement completely.  Multinodular goiter s/p FNA x3 in 12/2013: benign  Pt denies: - feeling nodules in neck - hoarseness - dysphagia - choking - SOB with lying down  DM2: Last hemoglobin A1c was: Lab Results  Component Value Date   HGBA1C 8.7 (A) 10/14/2018   HGBA1C 10.1 (A) 06/27/2018   HGBA1C 7.8 10/22/2017  08/06/2017: HbA1c 8.8%  Patient is  on: - Metformin ER 500 mg 2x a day with meals - Novolog 70/30: - 30 units before b'fast >> 25 >> 30 units - 20 units before dinner >> 25 >> 20 >> 25 >>  20 units  Pt checks her sugars once a day: - am: 157, 170-190 (eats at night) >> 110, 150-160 >> 11-11:30: 96-155 (icecream) - 2h after b'fast: n/c - before lunch: n/c - 2h after lunch: n/c - before dinner: n/c - 2h after dinner: 150s >> 153-204 - bedtime: n/c >> 96 - nighttime: n/c >> occasionally 55 x3 Lowest sugar was 40s >> 157 >> 110 >> 55; she has hypoglycemia awareness in the 60s. Highest sugar was 110 >> 189 >> 160 >> 204.  Glucometer: Accuchek Aviva  Pt's meals are: - Brunch: 7 layer salad, ham, [place]; cereal; egg + bacon + apple butter + OJ - Dinner: sandwich with meat; bowl of jello - Snacks: icecream, cookies, potato chips - after dinner, at 12 am-1 am  -+ Mild CKD, last BUN/creatinine:  06/10/2018: -Glucose 89, BUN/creatinine 21/1.19, EGFR 54 Lab Results  Component Value Date   BUN 16 02/05/2017   BUN 14 02/05/2017   CREATININE 1.08 (H) 02/05/2017   CREATININE 0.93 02/05/2017  Stopped losartan due to dizziness. -+ History of HL; lipids not available for review  No results found for: CHOL, HDL, LDLCALC, LDLDIRECT, TRIG, CHOLHDL  On pravastatin. 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  She also has glaucoma. -She denies numbness but has tingling in her feet  No family history of diabetes.  ROS: Constitutional: no weight gain/no weight loss, no fatigue, no subjective hyperthermia, no subjective hypothermia Eyes: no blurry vision, no xerophthalmia  ENT: no sore throat, + see HPI Cardiovascular: no CP/no SOB/no palpitations/no leg swelling Respiratory: no cough/no SOB/no wheezing Gastrointestinal: no N/no V/no D/no C/no acid reflux Musculoskeletal: no muscle aches/no joint aches Skin: no rashes, no hair loss Neurological: no tremors/no numbness/no tingling/no dizziness  I reviewed pt's medications, allergies, PMH, social hx, family hx, and changes were documented in the history of present illness. Otherwise,  unchanged from my initial visit note.  Past Medical History:  Diagnosis Date  . CKD (chronic kidney disease), stage III ([place])   . Diabetes mellitus without complication ([place])   . High cholesterol   . Hypertension   . Nontoxic goiter   . Symptomatic anemia 02/04/2017   Past Surgical History:  Procedure Laterality Date  . ABDOMINAL HYSTERECTOMY    . COLONOSCOPY WITH PROPOFOL N/A 02/05/2017   Procedure: COLONOSCOPY WITH PROPOFOL;  Surgeon: Doran Stabler, MD;  Location: [place];  Service: Endoscopy;  Laterality: N/A;  . ESOPHAGOGASTRODUODENOSCOPY (EGD) WITH PROPOFOL N/A 02/05/2017   Procedure: ESOPHAGOGASTRODUODENOSCOPY (EGD) WITH PROPOFOL;  Surgeon: Doran Stabler, MD;  Location: [place];  Service: Endoscopy;  Laterality: N/A;  . REPLACEMENT TOTAL KNEE Right    Social History   Socioeconomic History  . Marital status: Married    Spouse name: Not on file  . Number of children: Not on file  . Years of education: Not on file  . Highest education level: Not on file  Occupational History  . Occupation: retired  Scientific laboratory technician  . Financial resource strain: Not on file  . Food insecurity:    Worry: Not on file    Inability: Not on file  . Transportation needs:    Medical: Not on file    Non-medical: Not on file  Tobacco Use  . Smoking status: Never Smoker  . Smokeless tobacco: Never Used  Substance and Sexual Activity  . Alcohol use: Yes    Comment: wine occasional per pt.   . Drug use: No    Types: Marijuana    Comment: Previously smoked. Quit in 1981  . Sexual activity: Not on file  Lifestyle  . Physical activity:    Days per week: Not on file    Minutes per session: Not on file  . Stress: Not on file  Relationships  . Social connections:    Talks on phone: Not on file    Gets together: Not on file    Attends religious service: Not on file    Active member of club or organization: Not on file    Attends meetings of clubs or organizations: Not on file     Relationship status: Not on file  . Intimate partner violence:    Fear of current or ex partner: Not on file    Emotionally abused: Not on file    Physically abused: Not on file    Forced sexual activity: Not on file  Other Topics Concern  . Not on file  Social History Narrative  . Not on file   Current Outpatient Medications on File Prior to Visit  Medication Sig Dispense Refill  . amLODipine (NORVASC) 10 MG tablet Take 10 mg by mouth daily.  0  . aspirin 81 MG tablet Take 81 mg by mouth daily.    . furosemide (LASIX) 20 MG tablet Take 20 mg by mouth daily.  1  . metFORMIN (GLUCOPHAGE-XR) 500 MG 24 hr tablet TAKE 1 TABLET (500 MG TOTAL) BY MOUTH 2 (TWO) TIMES DAILY WITH A MEAL. [place]  tablet 3  . metoprolol succinate (TOPROL-XL) 25 MG 24 hr tablet Take 25 mg by mouth daily.  1  . mometasone-formoterol (DULERA) 200-5 MCG/ACT AERO Inhale 2 puffs into the lungs daily as needed for wheezing or shortness of breath.     Marland Kitchen NOVOLOG MIX 70/30 FLEXPEN (70-30) 100 UNIT/ML FlexPen Inject under skin 30 min before b'fast and 20 units before dinner 15 pen 4  . pantoprazole (PROTONIX) 40 MG tablet Take 1 tablet (40 mg total) by mouth daily. 30 tablet 0  . pravastatin (PRAVACHOL) 80 MG tablet TAKE 1 TABLET IN THE EVENING DAILY  5  . Propylene Glycol (SYSTANE BALANCE) 0.6 % SOLN Apply 1 drop to eye daily as needed (dry eyes).    . valsartan (DIOVAN) 320 MG tablet Take 320 mg by mouth daily.  5   No current facility-administered medications on file prior to visit.    Allergies  Allergen Reactions  . Ace Inhibitors Cough   Family History  Problem Relation Age of Onset  . Stroke Mother   . Cancer Father        pts states cancer in back  . Lung cancer Sister   . Heart disease Sister   . Cancer Brother        "  . Colon cancer Neg Hx     PE: There were no vitals taken for this visit. Wt Readings from Last 3 Encounters:  10/14/18 185 lb (83.9 kg)  06/27/18 188 lb (85.3 kg)  10/22/17 190 lb  9.6 oz (86.5 kg)   Constitutional:  in NAD  The physical exam was not performed (telephone l visit).  ASSESSMENT: 1. DM2, insulin-dependent, uncontrolled, with complications - CKD stage 3  2. Primary  HPTH  3.  Vitamin D deficiency  4. MNG   PLAN:  1. Patient with longstanding, uncontrolled, type 2 diabetes, on metformin and premixed insulin regimen.  At last visit, she was still only checking sugars in the morning and I strongly advised her again to check sugars later in the day, also.  At that time, we increased her insulin before dinner. - At last visit, HbA1c was still high, but better, at 8.7%  -At this visit, upon questioning, patient is dropping her sugars in the middle of the night as she is taking her insulin at bedtime, rather than before dinner, as advised.  Consequently, she avoids taking insulin if her sugars are at goal at that time.  We discussed that taking the second dose of insulin is important, but even more important is to take it approximately 15 minutes before dinner, not when she goes to bed.  I will reduce the evening dose of insulin to avoid any possible drop during the night. - I suggested to:  Patient Instructions  Please continue: - Metformin ER 500 mg 2x a day with meals  Please change: - Novolog 70/30: - 30 units before b'fast - 10-15 units before dinner  Restart vitamin D 2000 units daily.  Please return in 3 months with your sugar log.   -We will check another HbA1c when she returns to the clinic - continue checking sugars at different times of the day - check 2-3x a day, rotating checks - advised for yearly eye exams >> she is UTD - Return to clinic in 3-4 mo with sugar log      2. Primary  HPTH and 3. vitamin D deficiency -We reviewed her most recent PTH, calcium, vitamin D level.  Calcium was high at  last check, at 11.8 in 06/2018.  No recent levels.  We will recheck when she returns to the clinic -She was on vitamin D 2000 units daily, but  not taking it now.  Advised her to restart.  We will check another level at next visit. -Of note, the technetium sestamibi scan was nonlocalizing in the past -I again advised her to consider parathyroid surgery but she refused  4. MNG -Reviewed previous biopsies and these were negative -She does have a large goiter, but no compression symptoms -We will need to follow her expectantly for now, since she is reticent to consider surgery.  We discussed in the past that we can do thyroidectomy in the time of her parathyroid surgery, but she does not want to undergo surgery  - time spent with the patient: 15 min, of which >50% was spent in obtaining information about her symptoms, reviewing her previous labs, evaluations, and treatments, counseling her about her condition (please see the discussed topics above), and developing a plan to further investigate and treat it; she had a number of questions which I addressed.  Philemon Kingdom, MD PhD [facility] Endocrinology

## 2019-01-16 NOTE — Patient Instructions (Addendum)
Please continue: - Metformin ER 500 mg 2x a day with meals  Please change: - Novolog 70/30: - 30 units before b'fast - 10-15 units before dinner  Restart vitamin D 2000 units daily.  Please return in 3 months with your sugar log.

## 2019-04-28 ENCOUNTER — Ambulatory Visit: Payer: Medicare Other | Admitting: Internal Medicine

## 2019-04-28 NOTE — Progress Notes (Signed)
CANCELLED   HPI: [name] Le is a 73 y.o.-year-old female, presenting for follow-up for DM2, dx in early 2000s, insulin-dependent since 2009, uncontrolled, with complications (CKD stage III, PN), primary hyperparathyroidism, and multinodular goiter.  Last visit 3 months ago.  Mild primary hyperparathyroidism  -Refuses surgery -Of note, a technetium sestamibi parathyroid scan (08/2014) was not localizing -24-hour urine calcium  was in the upper limit of normal  Reviewed pertinent labs Received labs from PCP - 06/10/2018: -Glucose 89, BUN/creatinine 21/1.19, EGFR 54 -Calcium 11.8, corrected 11.7 (8.6-10.3)  Previously: Lab Results  Component Value Date   CALCIUM 10.9 (H) 04/09/2017   PTH 45 04/09/2017   PTH Comment 04/09/2017   Latest vitamin D level was low: Lab Results  Component Value Date   VD25OH 27.54 (L) 04/09/2017   She is supposed on 2000 units vitamin D daily.  We did not check her levels at last visit that she was out of the supplement.  At last visit she was off the supplement completely and I advised her to restart.  Multinodular goiter s/p FNA x3 in 12/2013: Benign  Pt denies: - feeling nodules in neck - hoarseness - dysphagia - choking - SOB with lying down  DM2: Last hemoglobin A1c was: Lab Results  Component Value Date   HGBA1C 8.7 (A) 10/14/2018   HGBA1C 10.1 (A) 06/27/2018   HGBA1C 7.8 10/22/2017  08/06/2017: HbA1c 8.8%  Patient is  on: - Metformin ER 500 mg 2x a day with meals - Novolog 70/30: - 30 units before b'fast >> 25 >> 30 units - 20 units before dinner >> 25 >> 20 >> 25 >> 20 >> 10-15 units  Pt checks her sugars once a day - am: 110, 150-160 >> 11-11:30: 96-155 (icecream) - 2h after b'fast: n/c - before lunch: n/c - 2h after lunch: n/c - before dinner: n/c - 2h after dinner: 150s >> 153-204 - bedtime: n/c >> 96 - nighttime: n/c >> occasionally 55 x3 Lowest sugar was 40s >>...55; she has hypoglycemia awareness in the  60s. Highest sugar was 204.  Glucometer: Accuchek Aviva  Pt's meals are: - Brunch: 7 layer salad, ham, [place]; cereal; egg + bacon + apple butter + OJ - Dinner: sandwich with meat; bowl of jello - Snacks: icecream, cookies, potato chips - after dinner, at 12 am-1 am  -+ Mild CKD, last BUN/creatinine:  06/10/2018: -Glucose 89, BUN/creatinine 21/1.19, EGFR 54 Lab Results  Component Value Date   BUN 16 02/05/2017   BUN 14 02/05/2017   CREATININE 1.08 (H) 02/05/2017   CREATININE 0.93 02/05/2017  Stopped losartan before last visit. -+ HL; lipids not available for review No results found for: CHOL, HDL, LDLCALC, LDLDIRECT, TRIG, CHOLHDL  On pravastatin. 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 + glaucoma. -no numbness but has tingling in her feet  No family history of diabetes.  ROS: Constitutional: no weight gain/no weight loss, no fatigue, no subjective hyperthermia, no subjective hypothermia Eyes: no blurry vision, no xerophthalmia ENT: no sore throat, no nodules palpated in neck, no dysphagia, no odynophagia, no hoarseness Cardiovascular: no CP/no SOB/no palpitations/no leg swelling Respiratory: no cough/no SOB/no wheezing Gastrointestinal: no N/no V/no D/no C/no acid reflux Musculoskeletal: no muscle aches/no joint aches Skin: no rashes, no hair loss Neurological: no tremors/no numbness/no tingling/no dizziness  I reviewed pt's medications, allergies, PMH, social hx, family hx, and changes were documented in the history of present illness. Otherwise, unchanged from my initial  visit note.  Past Medical History:  Diagnosis Date  . CKD (chronic kidney disease), stage III ([place])   . Diabetes mellitus without complication ([place])   . High cholesterol   . Hypertension   . Nontoxic goiter   . Symptomatic anemia 02/04/2017   Past Surgical History:  Procedure Laterality Date  . ABDOMINAL HYSTERECTOMY    . COLONOSCOPY WITH PROPOFOL  N/A 02/05/2017   Procedure: COLONOSCOPY WITH PROPOFOL;  Surgeon: Doran Stabler, MD;  Location: [facility];  Service: Endoscopy;  Laterality: N/A;  . ESOPHAGOGASTRODUODENOSCOPY (EGD) WITH PROPOFOL N/A 02/05/2017   Procedure: ESOPHAGOGASTRODUODENOSCOPY (EGD) WITH PROPOFOL;  Surgeon: Doran Stabler, MD;  Location: [facility];  Service: Endoscopy;  Laterality: N/A;  . REPLACEMENT TOTAL KNEE Right    Social History   Socioeconomic History  . Marital status: Married    Spouse name: Not on file  . Number of children: Not on file  . Years of education: Not on file  . Highest education level: Not on file  Occupational History  . Occupation: retired  Scientific laboratory technician  . Financial resource strain: Not on file  . Food insecurity    Worry: Not on file    Inability: Not on file  . Transportation needs    Medical: Not on file    Non-medical: Not on file  Tobacco Use  . Smoking status: Never Smoker  . Smokeless tobacco: Never Used  Substance and Sexual Activity  . Alcohol use: Yes    Comment: wine occasional per pt.   . Drug use: No    Types: Marijuana    Comment: Previously smoked. Quit in 1981  . Sexual activity: Not on file  Lifestyle  . Physical activity    Days per week: Not on file    Minutes per session: Not on file  . Stress: Not on file  Relationships  . Social Herbalist on phone: Not on file    Gets together: Not on file    Attends religious service: Not on file    Active member of club or organization: Not on file    Attends meetings of clubs or organizations: Not on file    Relationship status: Not on file  . Intimate partner violence    Fear of current or ex partner: Not on file    Emotionally abused: Not on file    Physically abused: Not on file    Forced sexual activity: Not on file  Other Topics Concern  . Not on file  Social History Narrative  . Not on file   Current Outpatient Medications on File Prior to Visit  Medication Sig Dispense  Refill  . amLODipine (NORVASC) 10 MG tablet Take 10 mg by mouth daily.  0  . aspirin 81 MG tablet Take 81 mg by mouth daily.    . furosemide (LASIX) 20 MG tablet Take 20 mg by mouth daily.  1  . metFORMIN (GLUCOPHAGE-XR) 500 MG 24 hr tablet TAKE 1 TABLET (500 MG TOTAL) BY MOUTH 2 (TWO) TIMES DAILY WITH A MEAL. 180 tablet 3  . metoprolol succinate (TOPROL-XL) 25 MG 24 hr tablet Take 25 mg by mouth daily.  1  . mometasone-formoterol (DULERA) 200-5 MCG/ACT AERO Inhale 2 puffs into the lungs daily as needed for wheezing or shortness of breath.     Marland Kitchen NOVOLOG MIX 70/30 FLEXPEN (70-30) 100 UNIT/ML FlexPen Inject under skin 30 min before b'fast and 20 units before dinner 15 pen 4  .  pantoprazole (PROTONIX) 40 MG tablet Take 1 tablet (40 mg total) by mouth daily. 30 tablet 0  . pravastatin (PRAVACHOL) 80 MG tablet TAKE 1 TABLET IN THE EVENING DAILY  5  . Propylene Glycol (SYSTANE BALANCE) 0.6 % SOLN Apply 1 drop to eye daily as needed (dry eyes).    . valsartan (DIOVAN) 320 MG tablet Take 320 mg by mouth daily.  5   No current facility-administered medications on file prior to visit.    Allergies  Allergen Reactions  . Ace Inhibitors Cough   Family History  Problem Relation Age of Onset  . Stroke Mother   . Cancer Father        pts states cancer in back  . Lung cancer Sister   . Heart disease Sister   . Cancer Brother        "  . Colon cancer Neg Hx     PE: There were no vitals taken for this visit. Wt Readings from Last 3 Encounters:  10/14/18 185 lb (83.9 kg)  06/27/18 188 lb (85.3 kg)  10/22/17 190 lb 9.6 oz (86.5 kg)   Constitutional: overweight, in NAD Eyes: PERRLA, EOMI, no exophthalmos ENT: moist mucous membranes, no thyromegaly, no cervical lymphadenopathy Cardiovascular: RRR, No MRG Respiratory: CTA B Gastrointestinal: abdomen soft, NT, ND, BS+ Musculoskeletal: no deformities, strength intact in all 4 Skin: moist, warm, no rashes Neurological: no tremor with outstretched  hands, DTR normal in all 4  ASSESSMENT: 1. DM2, insulin-dependent, uncontrolled, with complications - CKD stage 3  2. Primary  HPTH  3.  Vitamin D deficiency  4. MNG   PLAN:  1. Patient with readings visit regimen.  Latest HbA1c was 8.7% 6 months ago.  We could not check these at last visit, 3 months ago since this was a virtual visit.  At that time, she described low blood sugars in the middle of the night as she was taking her premixed insulin that time, rather than before dinner, as advised.  Subsequently, she avoided taking insulin if her sugars were at goal at that time.  We discussed about taking this before dinner whenever she eats.  We decreased the dose to avoid low blood sugars  - I suggested to:  Patient Instructions  Please continue:: - Metformin ER 500 mg 2x a day with meals - Novolog 70/30: - 30 units before b'fast - 10-15 units before dinner  Please return in 3-4 months with your sugar log.   - we checked her HbA1c: 7%  - advised to check sugars at different times of the day - 2x a day, rotating check times - advised for yearly eye exams >> she is UTD - return to clinic in 3-4 months     2. Primary  HPTH and 3. vitamin D deficiency -We reviewed her most recent PTH, calcium, vitamin D level.  Calcium was high at last check, 11.8 in 06/2008.  No recent levels.  We will recheck these today. -She was previously on 2000 units vitamin D daily but stopped before last visit.  I advised her to restart 2000 units at last visit -Repeat scan was nonlocalizing -We again discussed about parathyroid surgery but she refuses  4. MNG -reviewed prev. Nodule biopsies >> benign -she does have a large goiter with no compression symptoms -We will need to for now, since she is reticent to consider surgery.  Philemon Kingdom, MD PhD [facility] Endocrinology

## 2019-04-30 ENCOUNTER — Ambulatory Visit (INDEPENDENT_AMBULATORY_CARE_PROVIDER_SITE_OTHER): Payer: Medicare Other | Admitting: Internal Medicine

## 2019-04-30 ENCOUNTER — Encounter: Payer: Self-pay | Admitting: Internal Medicine

## 2019-04-30 ENCOUNTER — Other Ambulatory Visit: Payer: Self-pay

## 2019-04-30 DIAGNOSIS — E049 Nontoxic goiter, unspecified: Secondary | ICD-10-CM

## 2019-04-30 DIAGNOSIS — E1121 Type 2 diabetes mellitus with diabetic nephropathy: Secondary | ICD-10-CM | POA: Diagnosis not present

## 2019-04-30 DIAGNOSIS — E213 Hyperparathyroidism, unspecified: Secondary | ICD-10-CM

## 2019-04-30 DIAGNOSIS — E559 Vitamin D deficiency, unspecified: Secondary | ICD-10-CM | POA: Diagnosis not present

## 2019-04-30 DIAGNOSIS — Z794 Long term (current) use of insulin: Secondary | ICD-10-CM

## 2019-04-30 MED ORDER — ACCU-CHEK AVIVA PLUS VI STRP: ORAL_STRIP | 12 refills | Status: DC

## 2019-04-30 MED ORDER — NOVOLOG MIX 70/30 FLEXPEN (70-30) 100 UNIT/ML ~~LOC~~ SUPN: PEN_INJECTOR | SUBCUTANEOUS | 4 refills | Status: DC

## 2019-04-30 NOTE — Patient Instructions (Addendum)
Please continue: - Metformin ER 500 mg 2x a day with meals  Please decrease: - Novolog 70/30: - 30 units before b'fast - 10-15 units before dinner  Please restart vitamin D3 2000 units daily.  Please return in 3-4 months with your sugar log.

## 2019-04-30 NOTE — Progress Notes (Signed)
Patient location: Home My location: Office  Referring Provider: Harlan Stains, MD  I connected with the patient on 04/30/19 at  9:43 AM EDT by telephone and verified that I am speaking with the correct person.   I discussed the limitations of evaluation and management by telephone and the availability of in person appointments. The patient expressed understanding and agreed to proceed.   Details of the encounter are shown below.  HPI: [name] Le is a 73 y.o.-year-old female, presenting for follow-up for DM2, dx in early 2000s, insulin-dependent since 2009, uncontrolled, with complications (CKD stage III, PN), primary hyperparathyroidism, and multinodular goiter.  Last visit 3 months ago (virtual).  Primary hyperparathyroidism  -Refuses surgery -Of note, a technetium sestamibi parathyroid scan (08/2014) was nonlocalizing -24-hour urine calcium was at the upper limit of normal  Reviewed pertinent labs:  Labs from PCP- 06/10/2018: -Glucose 89, BUN/creatinine 21/1.19, EGFR 54 -Calcium 11.8, corrected 11.7 (8.6-10.3)  Previously: Lab Results  Component Value Date   CALCIUM 10.9 (H) 04/09/2017   PTH 45 04/09/2017   PTH Comment 04/09/2017   Latest vitamin D level was lower than normal: Lab Results  Component Value Date   VD25OH 27.54 (L) 04/09/2017   She supposed to be on 2000 units vitamin D daily.  We did not check her level at last visit that she was part of the supplement.  I advised her to restart. She did not restart this...  She has osteopenia per review of her DXA scan reports from 08/2018.  Multinodular goiter s/p FNA x3 in 12/2013: Benign  Pt denies: - feeling nodules in neck - hoarseness - dysphagia - choking - SOB with lying down  DM2: Last hemoglobin A1c was: Lab Results  Component Value Date   HGBA1C 8.7 (A) 10/14/2018   HGBA1C 10.1 (A) 06/27/2018   HGBA1C 7.8 10/22/2017  08/06/2017: HbA1c 8.8%  Patient is  on: - Metformin ER 500 mg 2x a day  with meals - Novolog 70/30: - 30 units before b'fast >> 25 >> 30 units - 20 units before dinner 25 >> 20 >> 10-15 units but actually takes 20 units (forgot to decrease the dose)  Pt checks her sugars once a day:  - 11-11:30: 96-155 (icecream)  >> 48 (took insulin at bedtime), 66, 90-196 - before lunch: n/c - 2h after lunch: n/c >> 155, 351 (pancakes) - before dinner: n/c - 2h after dinner: 150s >> 153-204 >> 82, 90 - bedtime: n/c >> 96 >> n/c - nighttime: n/c >> occasionally 55 x3 >> n/c Lowest sugar was 40s >>...55 >> 48; she has hypoglycemia awareness in the 60s. Highest sugar was 204 >> 351.  Glucometer: Accuchek Aviva  Pt's meals are: - Brunch: 7 layer salad, ham, [place]; cereal; egg + bacon + apple butter + OJ - Dinner: sandwich with meat; bowl of jello - Snacks: icecream, cookies, potato chips - after dinner, at 12 am-1 am  -+ Mild CKD, last BUN/creatinine:  06/10/2018: -Glucose 89, BUN/creatinine 21/1.19, EGFR 54 Lab Results  Component Value Date   BUN 16 02/05/2017   BUN 14 02/05/2017   CREATININE 1.08 (H) 02/05/2017   CREATININE 0.93 02/05/2017  Off losartan now. -+ HL; lipids not available for review: No results found for: CHOL, HDL, LDLCALC, LDLDIRECT, TRIG, CHOLHDL  On pravastatin. 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 - no numbness but has tingling in her feet  No family history of diabetes.  ROS:  Constitutional: no weight gain/no weight loss, no fatigue, no subjective hyperthermia, no subjective hypothermia Eyes: no blurry vision, no xerophthalmia ENT: no sore throat, + see HPI Cardiovascular: no CP/no SOB/no palpitations/no leg swelling Respiratory: no cough/no SOB/no wheezing Gastrointestinal: no N/no V/no D/no C/no acid reflux Musculoskeletal: no muscle aches/no joint aches Skin: no rashes, no hair loss Neurological: no tremors/no numbness/no tingling/no dizziness  I reviewed pt's  medications, allergies, PMH, social hx, family hx, and changes were documented in the history of present illness. Otherwise, unchanged from my initial visit note.  Past Medical History:  Diagnosis Date  . CKD (chronic kidney disease), stage III ([place])   . Diabetes mellitus without complication ([place])   . High cholesterol   . Hypertension   . Nontoxic goiter   . Symptomatic anemia 02/04/2017   Past Surgical History:  Procedure Laterality Date  . ABDOMINAL HYSTERECTOMY    . COLONOSCOPY WITH PROPOFOL N/A 02/05/2017   Procedure: COLONOSCOPY WITH PROPOFOL;  Surgeon: Doran Stabler, MD;  Location: [facility];  Service: Endoscopy;  Laterality: N/A;  . ESOPHAGOGASTRODUODENOSCOPY (EGD) WITH PROPOFOL N/A 02/05/2017   Procedure: ESOPHAGOGASTRODUODENOSCOPY (EGD) WITH PROPOFOL;  Surgeon: Doran Stabler, MD;  Location: [facility];  Service: Endoscopy;  Laterality: N/A;  . REPLACEMENT TOTAL KNEE Right    Social History   Socioeconomic History  . Marital status: Married    Spouse name: Not on file  . Number of children: Not on file  . Years of education: Not on file  . Highest education level: Not on file  Occupational History  . Occupation: retired  Scientific laboratory technician  . Financial resource strain: Not on file  . Food insecurity    Worry: Not on file    Inability: Not on file  . Transportation needs    Medical: Not on file    Non-medical: Not on file  Tobacco Use  . Smoking status: Never Smoker  . Smokeless tobacco: Never Used  Substance and Sexual Activity  . Alcohol use: Yes    Comment: wine occasional per pt.   . Drug use: No    Types: Marijuana    Comment: Previously smoked. Quit in 1981  . Sexual activity: Not on file  Lifestyle  . Physical activity    Days per week: Not on file    Minutes per session: Not on file  . Stress: Not on file  Relationships  . Social Herbalist on phone: Not on file    Gets together: Not on file    Attends religious service: Not on  file    Active member of club or organization: Not on file    Attends meetings of clubs or organizations: Not on file    Relationship status: Not on file  . Intimate partner violence    Fear of current or ex partner: Not on file    Emotionally abused: Not on file    Physically abused: Not on file    Forced sexual activity: Not on file  Other Topics Concern  . Not on file  Social History Narrative  . Not on file   Current Outpatient Medications on File Prior to Visit  Medication Sig Dispense Refill  . amLODipine (NORVASC) 10 MG tablet Take 10 mg by mouth daily.  0  . aspirin 81 MG tablet Take 81 mg by mouth daily.    . furosemide (LASIX) 20 MG tablet Take 20 mg by mouth daily.  1  . metFORMIN (GLUCOPHAGE-XR) 500  MG 24 hr tablet TAKE 1 TABLET (500 MG TOTAL) BY MOUTH 2 (TWO) TIMES DAILY WITH A MEAL. 180 tablet 3  . metoprolol succinate (TOPROL-XL) 25 MG 24 hr tablet Take 25 mg by mouth daily.  1  . mometasone-formoterol (DULERA) 200-5 MCG/ACT AERO Inhale 2 puffs into the lungs daily as needed for wheezing or shortness of breath.     Marland Kitchen NOVOLOG MIX 70/30 FLEXPEN (70-30) 100 UNIT/ML FlexPen Inject under skin 30 min before b'fast and 20 units before dinner 15 pen 4  . pantoprazole (PROTONIX) 40 MG tablet Take 1 tablet (40 mg total) by mouth daily. 30 tablet 0  . pravastatin (PRAVACHOL) 80 MG tablet TAKE 1 TABLET IN THE EVENING DAILY  5  . Propylene Glycol (SYSTANE BALANCE) 0.6 % SOLN Apply 1 drop to eye daily as needed (dry eyes).    . valsartan (DIOVAN) 320 MG tablet Take 320 mg by mouth daily.  5   No current facility-administered medications on file prior to visit.    Allergies  Allergen Reactions  . Ace Inhibitors Cough   Family History  Problem Relation Age of Onset  . Stroke Mother   . Cancer Father        pts states cancer in back  . Lung cancer Sister   . Heart disease Sister   . Cancer Brother        "  . Colon cancer Neg Hx     PE: There were no vitals taken for this  visit. Wt Readings from Last 3 Encounters:  10/14/18 185 lb (83.9 kg)  06/27/18 188 lb (85.3 kg)  10/22/17 190 lb 9.6 oz (86.5 kg)   Constitutional:  in NAD  The physical exam was not performed (telephone visit).  ASSESSMENT: 1. DM2, insulin-dependent, uncontrolled, with complications - CKD stage 3  2. Primary  HPTH  3. Vitamin D deficiency  4. MNG   PLAN:  1. Patient with uncontrolled type 2 diabetes, with latest HbA1c 8.7% 6 months ago.  We could not check her HbA1c at last visit, 3 months ago since this was a virtual visit.  At that time, she describes low blood sugars in the middle of the night as she was taking her premixed insulin at bedtime, rather than before dinner, as advised.  Subsequently, she avoided taking insulin if her sugars were at goal at bedtime.  I strongly advised her to move the second dose of premixed insulin before dinner but we also decreased the dose slightly to avoid low blood sugars during the night. - at this visit, she tells me she did not decrease the 70/30 insulin dose as advised at last visit, although she moved the dose before dinner. Subsequently, sugars are low after dinner and in am >> will decrease the dose now - I suggested to:  Patient Instructions  Please continue: - Metformin ER 500 mg 2x a day with meals  Please decrease: - Novolog 70/30: - 30 units before b'fast - 10-15 units before dinner  Please restart vitamin D3 2000 units daily.  Please return in 3-4 months with your sugar log.    - we would recheck her HbA1c when she returns to the clinic - advised to check sugars at different times of the day - 2-3x a day, rotating check times - advised for yearly eye exams >> she is not UTD - return to clinic in 3-4 months     2. Primary  HPTH and 3. vitamin D deficiency -We reviewed her most  recent PTH, calcium, vitamin D level -calcium was high at last check, 11.8 in 06/2018.  No recent levels. -We will need to recheck this when she  comes to the clinic -She was previously on 2000 units vitamin D daily but stopped before last visit.  I advised her to restart the vitamin D at 2000 units then. She forgot >> again advised her to start. -Of note, technetium sestamibi parathyroid scan was nonlocalizing in 2015 -We again discussed about parathyroid surgery but she refuses  4. MNG -Reviewed previous nodule biopsies: Benign -She does have a large goiter but no neck compression symptoms -We will need to follow her clinically for now since she is reticent to consider surgery  - time spent with the patient: 17 min, of which >50% was spent in obtaining information about her symptoms, reviewing her previous labs, evaluations, and treatments, counseling her about her conditions (please see the discussed topics above), and developing a plan to further investigate and treat them; she had a number of questions which I addressed.   Philemon Kingdom, MD PhD [facility] Endocrinology

## 2019-05-20 ENCOUNTER — Telehealth: Payer: Self-pay | Admitting: Internal Medicine

## 2019-05-20 ENCOUNTER — Encounter: Payer: Self-pay | Admitting: Internal Medicine

## 2019-05-20 NOTE — Telephone Encounter (Signed)
Only Metformin ER is recalled, and not all lots. Let's send regular Metformin 500 mg 2x a day.

## 2019-05-20 NOTE — Telephone Encounter (Signed)
Patient just spoke with [facility] told patient ALL Metformin has been recalled and to stop taking what Metformin she has on hand immediately. Do  Please send a new RX for a substitute for Metformin to:   [id number] [place], [place] [phone] (Phone) [phone] (Fax)

## 2019-05-26 ENCOUNTER — Other Ambulatory Visit: Payer: Self-pay

## 2019-05-26 MED ORDER — METFORMIN HCL 500 MG PO TABS: ORAL_TABLET | ORAL | 2 refills | Status: DC

## 2019-05-26 NOTE — Telephone Encounter (Signed)
Rx has been sent  

## 2019-06-19 ENCOUNTER — Other Ambulatory Visit: Payer: Self-pay

## 2019-06-19 DIAGNOSIS — Z20822 Contact with and (suspected) exposure to covid-19: Secondary | ICD-10-CM

## 2019-06-21 LAB — NOVEL CORONAVIRUS, NAA: SARS-CoV-2, NAA: NOT DETECTED

## 2019-06-26 ENCOUNTER — Telehealth: Payer: Self-pay | Admitting: General Practice

## 2019-06-26 NOTE — Telephone Encounter (Signed)
Patient informed of negative covid-19 result. Patient verbalized understanding.  °

## 2019-07-02 ENCOUNTER — Ambulatory Visit: Payer: Medicare Other | Admitting: Internal Medicine

## 2019-09-25 ENCOUNTER — Ambulatory Visit: Payer: Medicare Other | Attending: Internal Medicine

## 2019-09-25 DIAGNOSIS — Z20822 Contact with and (suspected) exposure to covid-19: Secondary | ICD-10-CM

## 2019-09-26 ENCOUNTER — Telehealth: Payer: Self-pay | Admitting: General Practice

## 2019-09-26 LAB — NOVEL CORONAVIRUS, NAA: SARS-CoV-2, NAA: NOT DETECTED

## 2019-09-26 NOTE — Telephone Encounter (Signed)
Negative COVID results given. Patient results "NOT Detected." Caller expressed understanding. ° °

## 2019-09-27 ENCOUNTER — Other Ambulatory Visit: Payer: Self-pay | Admitting: Internal Medicine

## 2019-09-28 ENCOUNTER — Telehealth: Payer: Self-pay | Admitting: Internal Medicine

## 2019-09-28 MED ORDER — NOVOLOG MIX 70/30 FLEXPEN (70-30) 100 UNIT/ML ~~LOC~~ SUPN: PEN_INJECTOR | SUBCUTANEOUS | 14 refills | Status: DC

## 2019-09-28 NOTE — Telephone Encounter (Signed)
[name] Le from [facility] on [address] requests to be called at ph# [phone] to clarify dosage of Novolog

## 2019-09-28 NOTE — Telephone Encounter (Signed)
Correct RX sent.

## 2019-10-05 ENCOUNTER — Telehealth (HOSPITAL_COMMUNITY): Payer: Self-pay | Admitting: Nurse Practitioner

## 2019-10-05 ENCOUNTER — Emergency Department (HOSPITAL_COMMUNITY): Payer: Medicare Other

## 2019-10-05 ENCOUNTER — Other Ambulatory Visit: Payer: Self-pay

## 2019-10-05 ENCOUNTER — Emergency Department (HOSPITAL_COMMUNITY)
Admission: EM | Admit: 2019-10-05 | Discharge: 2019-10-05 | Disposition: A | Discharge status: Home / Self Care | Payer: Medicare Other | Attending: Emergency Medicine | Admitting: Emergency Medicine

## 2019-10-05 ENCOUNTER — Encounter (HOSPITAL_COMMUNITY): Payer: Self-pay

## 2019-10-05 DIAGNOSIS — U071 COVID-19: Secondary | ICD-10-CM | POA: Diagnosis not present

## 2019-10-05 DIAGNOSIS — I129 Hypertensive chronic kidney disease with stage 1 through stage 4 chronic kidney disease, or unspecified chronic kidney disease: Secondary | ICD-10-CM | POA: Insufficient documentation

## 2019-10-05 DIAGNOSIS — E1122 Type 2 diabetes mellitus with diabetic chronic kidney disease: Secondary | ICD-10-CM | POA: Diagnosis not present

## 2019-10-05 DIAGNOSIS — E860 Dehydration: Secondary | ICD-10-CM | POA: Insufficient documentation

## 2019-10-05 DIAGNOSIS — N183 Chronic kidney disease, stage 3 unspecified: Secondary | ICD-10-CM | POA: Diagnosis not present

## 2019-10-05 DIAGNOSIS — R05 Cough: Secondary | ICD-10-CM | POA: Diagnosis not present

## 2019-10-05 DIAGNOSIS — Z79899 Other long term (current) drug therapy: Secondary | ICD-10-CM | POA: Diagnosis not present

## 2019-10-05 DIAGNOSIS — Z794 Long term (current) use of insulin: Secondary | ICD-10-CM | POA: Insufficient documentation

## 2019-10-05 DIAGNOSIS — K5900 Constipation, unspecified: Secondary | ICD-10-CM | POA: Diagnosis present

## 2019-10-05 DIAGNOSIS — R0789 Other chest pain: Secondary | ICD-10-CM | POA: Insufficient documentation

## 2019-10-05 LAB — TROPONIN I (HIGH SENSITIVITY)
Troponin I (High Sensitivity): 6 ng/L (ref <18)
Troponin I (High Sensitivity): 5 ng/L (ref <18)

## 2019-10-05 LAB — URINALYSIS, ROUTINE W REFLEX MICROSCOPIC
Bilirubin Urine: NEGATIVE
Glucose, UA: NEGATIVE mg/dL
Hgb urine dipstick: NEGATIVE
Ketones, ur: NEGATIVE mg/dL
Leukocytes,Ua: NEGATIVE
Nitrite: NEGATIVE
Protein, ur: NEGATIVE mg/dL
Specific Gravity, Urine: 1.013 (ref 1.005–1.030)
pH: 6 (ref 5.0–8.0)

## 2019-10-05 LAB — COMPREHENSIVE METABOLIC PANEL
ALT: 14 U/L (ref 0–44)
AST: 16 U/L (ref 15–41)
Albumin: 3.6 g/dL (ref 3.5–5.0)
Alkaline Phosphatase: 75 U/L (ref 38–126)
Anion gap: 10 (ref 5–15)
BUN: 28 mg/dL — ABNORMAL HIGH (ref 8–23)
CO2: 23 mmol/L (ref 22–32)
Calcium: 11.2 mg/dL — ABNORMAL HIGH (ref 8.9–10.3)
Chloride: 103 mmol/L (ref 98–111)
Creatinine, Ser: 1.7 mg/dL — ABNORMAL HIGH (ref 0.44–1.00)
GFR calc Af Amer: 34 mL/min — ABNORMAL LOW (ref >60)
GFR calc non Af Amer: 29 mL/min — ABNORMAL LOW (ref >60)
Glucose, Bld: 128 mg/dL — ABNORMAL HIGH (ref 70–99)
Potassium: 3.8 mmol/L (ref 3.5–5.1)
Sodium: 136 mmol/L (ref 135–145)
Total Bilirubin: 0.9 mg/dL (ref 0.3–1.2)
Total Protein: 7.1 g/dL (ref 6.5–8.1)

## 2019-10-05 LAB — CBC WITH DIFFERENTIAL/PLATELET
Abs Immature Granulocytes: 0.01 10*3/uL (ref 0.00–0.07)
Basophils Absolute: 0 10*3/uL (ref 0.0–0.1)
Basophils Relative: 0 %
Eosinophils Absolute: 0 10*3/uL (ref 0.0–0.5)
Eosinophils Relative: 0 %
HCT: 35.7 % — ABNORMAL LOW (ref 36.0–46.0)
Hemoglobin: 11.2 g/dL — ABNORMAL LOW (ref 12.0–15.0)
Immature Granulocytes: 0 %
Lymphocytes Relative: 19 %
Lymphs Abs: 0.5 10*3/uL — ABNORMAL LOW (ref 0.7–4.0)
MCH: 27.9 pg (ref 26.0–34.0)
MCHC: 31.4 g/dL (ref 30.0–36.0)
MCV: 89 fL (ref 80.0–100.0)
Monocytes Absolute: 0.3 10*3/uL (ref 0.1–1.0)
Monocytes Relative: 10 %
Neutro Abs: 1.9 10*3/uL (ref 1.7–7.7)
Neutrophils Relative %: 71 %
Platelets: 176 10*3/uL (ref 150–400)
RBC: 4.01 MIL/uL (ref 3.87–5.11)
RDW: 13.1 % (ref 11.5–15.5)
WBC: 2.7 10*3/uL — ABNORMAL LOW (ref 4.0–10.5)
nRBC: 0 % (ref 0.0–0.2)

## 2019-10-05 LAB — POC SARS CORONAVIRUS 2 AG -  ED: SARS Coronavirus 2 Ag: POSITIVE — AB

## 2019-10-05 MED ORDER — LACTATED RINGERS IV BOLUS
1000.0000 mL | Freq: Once | INTRAVENOUS | Status: AC
  Administered 2019-10-05: 1000 mL via INTRAVENOUS

## 2019-10-05 MED ORDER — SODIUM CHLORIDE 0.9 % IV BOLUS
500.0000 mL | Freq: Once | INTRAVENOUS | Status: AC
  Administered 2019-10-05: 500 mL via INTRAVENOUS

## 2019-10-05 NOTE — ED Notes (Signed)
Got patient on the monitor patient is resting with call bell in reach  ?

## 2019-10-05 NOTE — ED Triage Notes (Signed)
Pt reports she and her husband both tested COVID+ about a week ago. C/o constipation, wants to know if it is related to COVID. Denies shortness of breath, dizziness, n/v. A&O x4.

## 2019-10-05 NOTE — ED Notes (Signed)
Patient verbalizes understanding of discharge instructions. Opportunity for questioning and answers were provided. Armband removed by staff, pt discharged from ED in wheelchair to home.   

## 2019-10-05 NOTE — Telephone Encounter (Signed)
Called to Discuss with patient about Covid symptoms and the use of bamlanivimab, a monoclonal antibody infusion for those with mild to moderate Covid symptoms and at a high risk of hospitalization.     Pt is qualified for this infusion at the [facility] infusion center due to co-morbid conditions and/or a member of an at-risk group.     Unable to reach pt. Left message to return call. No mychart.

## 2019-10-05 NOTE — Discharge Instructions (Addendum)
Please see your doctor for follow-up and recheck of your kidney function.  Be sure to drink plenty of fluids.  If at any point you develop trouble breathing, vomiting, or any other new/concerning symptoms then return to the ER for evaluation.

## 2019-10-05 NOTE — ED Notes (Signed)
Walked patient in room; Sats @ 100% on room air.

## 2019-10-05 NOTE — ED Provider Notes (Signed)
[name] Le   CSN: [id number] Arrival date & time: 10/05/19  1014     History Chief Complaint  Patient presents with  . Constipation    [name] Le is a 74 y.o. female.  HPI 74 year old female presents with constipation.  Has been ongoing for about a week.  She is passing gas but no bowel movements.  She has lower abdominal discomfort when trying to have a bowel movement but otherwise no abdominal pain.  No vomiting or fever.  She is been having a cough for about a week.  Just prior to the cough she got tested for Covid because her son who has been around her has had Covid-like symptoms.  She has been getting some chest pain when coughing for the last couple days.  No shortness of breath.  Past Medical History:  Diagnosis Date  . CKD (chronic kidney disease), stage III   . Diabetes mellitus without complication ([place])   . High cholesterol   . Hypertension   . Nontoxic goiter   . Symptomatic anemia 02/04/2017    Patient Active Problem List   Diagnosis Date Noted  . Vitamin D insufficiency 02/13/2018  . Iron deficiency anemia due to chronic blood loss   . AVM (arteriovenous malformation) of small bowel, acquired   . Symptomatic anemia 02/04/2017  . Left shoulder pain 02/04/2017  . Chronic kidney disease, stage II (mild) 11/07/2015  . Chronic kidney disease, stage III (moderate) 11/07/2015  . 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 Hyperlipidemia 11/07/2015  . Type 2 diabetes mellitus with diabetic nephropathy ([place]) 11/07/2015  . Hypertensive chronic kidney disease with stage 1 through stage 4 chronic kidney disease, or unspecified chronic kidney disease 11/07/2015  . Hyperparathyroidism ([place]) 10/14/2015  . Essential hypertension 10/11/2015  . Hypercalcemia 03/29/2015  . Chronic cough 07/06/2014    Past Surgical History:  Procedure Laterality Date  . ABDOMINAL HYSTERECTOMY    . COLONOSCOPY WITH PROPOFOL N/A 02/05/2017   Procedure: COLONOSCOPY WITH PROPOFOL;  Surgeon: Doran Stabler, MD;  Location: [facility];  Service: Endoscopy;  Laterality: N/A;  . ESOPHAGOGASTRODUODENOSCOPY (EGD) WITH PROPOFOL N/A 02/05/2017   Procedure: ESOPHAGOGASTRODUODENOSCOPY (EGD) WITH PROPOFOL;  Surgeon: Doran Stabler, MD;  Location: [facility];  Service: Endoscopy;  Laterality: N/A;  . REPLACEMENT TOTAL KNEE Right      OB History   No obstetric history on file.     Family History  Problem Relation Age of Onset  . Stroke Mother   . Cancer Father        pts states cancer in back  . Lung cancer Sister   . Heart disease Sister   . Cancer Brother        "  . Colon cancer Neg Hx     Social History   Tobacco Use  . Smoking status: Never Smoker  . Smokeless tobacco: Never Used  Substance Use Topics  . Alcohol use: Yes    Comment: wine occasional per pt.   . Drug use: No    Types: Marijuana    Comment: Previously smoked. Quit in [place] Medications Prior to Admission medications   Medication Sig Start Date End Date Taking? Authorizing Provider  amLODipine (NORVASC) 10 MG tablet Take 10 mg by mouth daily. 01/31/17   [provider]  aspirin 81 MG tablet Take 81 mg by mouth daily.    [provider]  furosemide (LASIX) 20 MG tablet Take 20 mg  by mouth daily. 02/20/17   [provider]  glucose blood (ACCU-CHEK AVIVA PLUS) test strip Use as instructed 3-4x a day 04/30/19   Philemon Kingdom, MD  metFORMIN (GLUCOPHAGE) 500 MG tablet Take 1 tablet by mouth twice daily. 05/26/19   Philemon Kingdom, MD  metoprolol succinate (TOPROL-XL) 25 MG 24 hr tablet Take 25 mg by mouth daily. 02/18/17   [provider]  mometasone-formoterol (DULERA) 200-5 MCG/ACT AERO Inhale 2 puffs into the lungs daily as needed for wheezing or shortness of breath.     [provider]  NOVOLOG MIX 70/30 FLEXPEN (70-30) 100 UNIT/ML FlexPen INJECT UNDER SKIN 30 MIN BEFORE BREAKFAST & 10-15 UNITS  BEFORE DINNER 09/28/19   Philemon Kingdom, MD  pantoprazole (PROTONIX) 40 MG tablet Take 1 tablet (40 mg total) by mouth daily. 02/06/17   Domenic Polite, MD  pravastatin (PRAVACHOL) 80 MG tablet TAKE 1 TABLET IN THE EVENING DAILY 02/28/15   [provider]  Propylene Glycol (SYSTANE BALANCE) 0.6 % SOLN Apply 1 drop to eye daily as needed (dry eyes).    [provider]  valsartan (DIOVAN) 320 MG tablet Take 320 mg by mouth daily. 10/02/15   [provider]    Allergies    Ace inhibitors  Review of Systems   Review of Systems  Constitutional: Negative for fever.  Respiratory: Positive for cough. Negative for shortness of breath.   Cardiovascular: Positive for chest pain.  Gastrointestinal: Positive for abdominal pain and constipation. Negative for vomiting.  All other systems reviewed and are negative.   Physical Exam Updated Vital Signs BP (!) 178/86   Pulse 69   Temp 99.1 F (37.3 C) (Oral)   Resp 18   Ht 5\' 6"  (1.676 m)   Wt 83.9 kg   SpO2 100%   BMI 29.86 kg/m   Physical Exam Vitals and nursing Le reviewed.  Constitutional:      General: She is not in acute distress.    Appearance: She is well-developed. She is not ill-appearing or diaphoretic.  HENT:     Head: Normocephalic and atraumatic.     Right Ear: External ear normal.     Left Ear: External ear normal.     Nose: Nose normal.  Eyes:     General:        Right eye: No discharge.        Left eye: No discharge.  Cardiovascular:     Rate and Rhythm: Normal rate and regular rhythm.     Heart sounds: Normal heart sounds.  Pulmonary:     Effort: Pulmonary effort is normal.     Breath sounds: Normal breath sounds.  Chest:     Chest wall: No tenderness.  Abdominal:     General: There is no distension.     Palpations: Abdomen is soft.     Tenderness: There is no abdominal tenderness.  Genitourinary:    Comments: Essentially no stool and definitely no impaction on rectal  exam Skin:    General: Skin is warm and dry.  Neurological:     Mental Status: She is alert.  Psychiatric:        Mood and Affect: Mood is not anxious.     ED Results / Procedures / Treatments   Labs (all labs ordered are listed, but only abnormal results are displayed) Labs Reviewed  COMPREHENSIVE METABOLIC PANEL - Abnormal; Notable for the following components:      Result Value   Glucose, Bld 128 (*)  BUN 28 (*)    Creatinine, Ser 1.70 (*)    Calcium 11.2 (*)    GFR calc non Af Amer 29 (*)    GFR calc Af Amer 34 (*)    All other components within normal limits  CBC WITH DIFFERENTIAL/PLATELET - Abnormal; Notable for the following components:   WBC 2.7 (*)    Hemoglobin 11.2 (*)    HCT 35.7 (*)    Lymphs Abs 0.5 (*)    All other components within normal limits  POC SARS CORONAVIRUS 2 AG -  ED - Abnormal; Notable for the following components:   SARS Coronavirus 2 Ag POSITIVE (*)    All other components within normal limits  URINALYSIS, ROUTINE W REFLEX MICROSCOPIC  TROPONIN I (HIGH SENSITIVITY)  TROPONIN I (HIGH SENSITIVITY)    EKG EKG Interpretation  Date/Time:  Monday October 05 2019 10:17:45 EST Ventricular Rate:  89 PR Interval:  190 QRS Duration: 102 QT Interval:  348 QTC Calculation: 423 R Axis:   99 Text Interpretation: Normal sinus rhythm Rightward axis Borderline ECG nonspecific T waves new since 2018 Confirmed by Sherwood Gambler [phone]) on 10/05/2019 10:27:32 AM   Radiology DG Chest Portable 1 View  Result Date: 10/05/2019 CLINICAL DATA:  Cough. EXAM: PORTABLE CHEST 1 VIEW COMPARISON:  Chest x-ray dated Feb 04, 2017. FINDINGS: The heart size and mediastinal contours are within normal limits. Normal pulmonary vascularity. No focal consolidation, pleural effusion, or pneumothorax. Mild elevation of the left hemidiaphragm. No acute osseous abnormality. IMPRESSION: No active disease. Electronically Signed   By: Titus Dubin M.D.   On: 10/05/2019 11:10    DG Abd Portable 1 View  Result Date: 10/05/2019 CLINICAL DATA:  Constipation. EXAM: PORTABLE ABDOMEN - 1 VIEW COMPARISON:  CT abdomen pelvis dated April 14, 2006. FINDINGS: The bowel gas pattern is normal. No excessive colonic stool burden. No radio-opaque calculi or other significant radiographic abnormality are seen. Multiple phleboliths in the pelvis. No acute osseous abnormality. IMPRESSION: Negative. Electronically Signed   By: Titus Dubin M.D.   On: 10/05/2019 11:24    Procedures Procedures (including critical care time)  Medications Ordered in ED Medications  sodium chloride 0.9 % bolus 500 mL (0 mLs Intravenous Stopped 10/05/19 1433)  lactated ringers bolus 1,000 mL (1,000 mLs Intravenous New Bag/Given 10/05/19 1434)    ED Course  I have reviewed the triage vital signs and the nursing notes.  Pertinent labs & imaging results that were available during my care of the patient were reviewed by me and considered in my medical decision making (see chart for details).    MDM Rules/Calculators/A&P                      Is positive for the Covid 19 infection.  She has nonspecific chest pain that is probably from coughing.  Troponins are negative.  Patient has no infection.  Constipation is probably from decreased intake rather than acute emergent abdominal pathology.  There is no vomiting.  Abdominal exam is benign.  She is dehydrated with an elevated creatinine.  She was given IV fluids. Mild hypercalcemia likely from dehydration.  [name] Le was evaluated in Emergency Department on 10/05/2019 for the symptoms described in the history of present illness. She was evaluated in the context of the global COVID-19 pandemic, which necessitated consideration that the patient might be at risk for infection with the SARS-CoV-2 virus that causes COVID-19. Institutional protocols and algorithms that pertain to the evaluation of  patients at risk for COVID-19 are in a state of rapid change based  on information released by regulatory bodies including the CDC and federal and state organizations. These policies and algorithms were followed during the patient's care in the ED.  Final Clinical Impression(s) / ED Diagnoses Final diagnoses:  COVID-19 virus infection  Dehydration    Rx / DC Orders ED Discharge Orders    None       Sherwood Gambler, MD 10/05/19 1547

## 2019-10-06 ENCOUNTER — Telehealth: Payer: Self-pay | Admitting: Nurse Practitioner

## 2019-10-06 ENCOUNTER — Telehealth: Payer: Self-pay | Admitting: Adult Health

## 2019-10-06 NOTE — Telephone Encounter (Signed)
  I connected by phone with [name] Le on 10/06/2019 at 12:58 PM to discuss the potential use of an new treatment for mild to moderate COVID-19 viral infection in non-hospitalized patients.  This patient is a 74 y.o. female that meets the FDA criteria for Emergency Use Authorization of bamlanivimab or casirivimab\imdevimab.  Has a (+) direct SARS-CoV-2 viral test result  Has mild or moderate COVID-19   Is ? 74 years of age and weighs ? 40 kg  Is NOT hospitalized due to COVID-19  Is NOT requiring oxygen therapy or requiring an increase in baseline oxygen flow rate due to COVID-19  Is within 10 days of symptom onset  Has at least one of the high risk factor(s) for progression to severe COVID-19 and/or hospitalization as defined in EUA.  Specific high risk criteria : Chronic Kidney Disease (CKD), T2D, Age- 64 years   I have spoken and communicated the following to the patient or parent/caregiver:  1. FDA has authorized the emergency use of bamlanivimab and casirivimab\imdevimab for the treatment of mild to moderate COVID-19 in adults and pediatric patients with positive results of direct SARS-CoV-2 viral testing who are 1 years of age and older weighing at least 40 kg, and who are at high risk for progressing to severe COVID-19 and/or hospitalization.  2. The significant known and potential risks and benefits of bamlanivimab and casirivimab\imdevimab, and the extent to which such potential risks and benefits are unknown.  3. Information on available alternative treatments and the risks and benefits of those alternatives, including clinical trials.  4. Patients treated with bamlanivimab and casirivimab\imdevimab should continue to self-isolate and use infection control measures (e.g., wear mask, isolate, social distance, avoid sharing personal items, clean and disinfect "high touch" surfaces, and frequent handwashing) according to CDC guidelines.   5. The patient or parent/caregiver  has the option to accept or refuse bamlanivimab or casirivimab\imdevimab .  After reviewing this information with the patient, The patient agreed to proceed with receiving the bamlanimivab infusion and will be provided a copy of the Fact sheet prior to receiving the infusion.. She has been placed on the cancellation list for 10/07/2019- instructions provided. Pt reports sx onset 09/18/2019- this is what was also documented in visit at ED. She currently reports non-productive cough with associated chest tightness, diffuse muscle aches, and fatigue. She reports being able to eat/drink normally. She denies dyspnea/fever/N/V/loss of sense of smell or taste. Discussed Red Flag sx's at length and if any develop- seek immediate medical assistance. She has upcoming TeleMedicine appt with her PCP 10/07/2019 at 1430- encouraged to keep. All questions/concerns answered at length.  Esaw Grandchild 10/06/2019 12:58 PM

## 2019-10-06 NOTE — Telephone Encounter (Signed)
Called to Discuss with patient about Covid symptoms and the use of bamlanivimab, a monoclonal antibody infusion for those with mild to moderate Covid symptoms and at a high risk of hospitalization.     Pt is qualified for this infusion at the [facility] due to co-morbid conditions and/or a member of an at-risk group.     Unable to reach pt  

## 2019-10-06 NOTE — Telephone Encounter (Signed)
I connected by phone with [name] Le on 10/06/2019 at 12:58 PM to discuss the potential use of an new treatment for mild to moderate COVID-19 viral infection in non-hospitalized patients.  This patient is a 74 y.o. female that meets the FDA criteria for Emergency Use Authorization of bamlanivimab or casirivimab\imdevimab.  Has a (+) direct SARS-CoV-2 viral test result  Has mild or moderate COVID-19   Is ?74 years of age and weighs ?40 kg  Is NOT hospitalized due to COVID-19  Is NOT requiring oxygen therapy or requiring an increase in baseline oxygen flow rate due to COVID-19  Is within 10 days of symptom onset  Has at least one of the high risk factor(s)for progression to severe COVID-19 and/or hospitalization as defined in EUA. ? Specific high risk criteria : Chronic Kidney Disease (CKD), T2D, Age- 26 years   I have spoken and communicated the following to the patient or parent/caregiver:  1. FDA has authorized the emergency use of bamlanivimab and casirivimab\imdevimab for the treatment of mild to moderate COVID-19 in adults and pediatric patients with positive results of direct SARS-CoV-2 viral testing who are 16 years of age and older weighing at least 40 kg, and who are at high risk for progressing to severe COVID-19 and/or hospitalization.  2. The significant known and potential risks and benefits of bamlanivimab and casirivimab\imdevimab, and the extent to which such potential risks and benefits are unknown.  3. Information on available alternative treatments and the risks and benefits of those alternatives, including clinical trials.  4. Patients treated with bamlanivimab and casirivimab\imdevimab should continue to self-isolate and use infection control measures (e.g., wear mask, isolate, social distance, avoid sharing personal items, clean and disinfect "high touch" surfaces, and frequent handwashing) according to CDC guidelines.   5. The patient or  parent/caregiver has the option to accept or refuse bamlanivimab or casirivimab\imdevimab .  After reviewing this information with the patient, The patient agreed to proceed with receiving the bamlanimivab infusion and will be provided a copy of the Fact sheet prior to receiving the infusion.. She has been placed on the cancellation list for 10/07/2019- instructions provided. Pt reports sx onset 09/18/2019- this is what was also documented in visit at ED. She currently reports non-productive cough with associated chest tightness, diffuse muscle aches, and fatigue. She reports being able to eat/drink normally. She denies dyspnea/fever/N/V/loss of sense of smell or taste. Discussed Red Flag sx's at length and if any develop- seek immediate medical assistance. She has upcoming TeleMedicine appt with her PCP 10/07/2019 at 1430- encouraged to keep. All questions/concerns answered at length.  Esaw Grandchild 10/06/2019 12:58 PM

## 2019-10-07 ENCOUNTER — Ambulatory Visit (HOSPITAL_COMMUNITY)
Admission: RE | Admit: 2019-10-07 | Discharge: 2019-10-07 | Disposition: A | Discharge status: Home / Self Care | Payer: Medicare Other | Source: Ambulatory Visit | Attending: Pulmonary Disease | Admitting: Pulmonary Disease

## 2019-10-07 ENCOUNTER — Other Ambulatory Visit: Payer: Self-pay | Admitting: Internal Medicine

## 2019-10-07 DIAGNOSIS — I10 Essential (primary) hypertension: Secondary | ICD-10-CM | POA: Diagnosis present

## 2019-10-07 DIAGNOSIS — U071 COVID-19: Secondary | ICD-10-CM | POA: Diagnosis present

## 2019-10-07 DIAGNOSIS — Z23 Encounter for immunization: Secondary | ICD-10-CM | POA: Insufficient documentation

## 2019-10-07 MED ORDER — FAMOTIDINE IN NACL 20-0.9 MG/50ML-% IV SOLN: 20.0000 mg | Freq: Once | INTRAVENOUS | Status: DC | PRN

## 2019-10-07 MED ORDER — ALBUTEROL SULFATE HFA 108 (90 BASE) MCG/ACT IN AERS: 2.0000 | INHALATION_SPRAY | Freq: Once | RESPIRATORY_TRACT | Status: DC | PRN

## 2019-10-07 MED ORDER — METHYLPREDNISOLONE SODIUM SUCC 125 MG IJ SOLR: 125.0000 mg | Freq: Once | INTRAMUSCULAR | Status: DC | PRN

## 2019-10-07 MED ORDER — DIPHENHYDRAMINE HCL 50 MG/ML IJ SOLN: 50.0000 mg | Freq: Once | INTRAMUSCULAR | Status: DC | PRN

## 2019-10-07 MED ORDER — EPINEPHRINE 0.3 MG/0.3ML IJ SOAJ: 0.3000 mg | Freq: Once | INTRAMUSCULAR | Status: DC | PRN

## 2019-10-07 MED ORDER — SODIUM CHLORIDE 0.9 % IV SOLN
INTRAVENOUS | Status: DC | PRN
  Administered 2019-10-07: 250 mL via INTRAVENOUS

## 2019-10-07 MED ORDER — SODIUM CHLORIDE 0.9 % IV SOLN
700.0000 mg | Freq: Once | INTRAVENOUS | Status: AC
  Administered 2019-10-07: 09:00:00 700 mg via INTRAVENOUS
  Filled 2019-10-07: qty 20

## 2019-10-07 NOTE — Discharge Instructions (Signed)

## 2019-10-07 NOTE — Progress Notes (Signed)
  Diagnosis: COVID-19  Physician: DR. Joya Gaskins  Procedure: [facility] Infusion [facility] Med: bamlanivimab infusion - Provided patient with bamlanimivab fact sheet for patients, parents and caregivers prior to infusion.  Complications: No immediate complications noted.  Discharge: Discharged home   [name] Le 10/07/2019

## 2019-10-28 ENCOUNTER — Other Ambulatory Visit: Payer: Self-pay

## 2019-10-28 ENCOUNTER — Ambulatory Visit (INDEPENDENT_AMBULATORY_CARE_PROVIDER_SITE_OTHER): Payer: Medicare Other | Admitting: Physician Assistant

## 2019-10-28 ENCOUNTER — Encounter: Payer: Self-pay | Admitting: Physician Assistant

## 2019-10-28 ENCOUNTER — Ambulatory Visit (INDEPENDENT_AMBULATORY_CARE_PROVIDER_SITE_OTHER)
Admission: RE | Admit: 2019-10-28 | Discharge: 2019-10-28 | Disposition: A | Discharge status: Home / Self Care | Payer: Medicare Other | Source: Ambulatory Visit | Attending: Physician Assistant | Admitting: Physician Assistant

## 2019-10-28 VITALS — BP 138/80 | HR 100 | Temp 98.6°F | Ht 66.0 in | Wt 179.0 lb

## 2019-10-28 DIAGNOSIS — M549 Dorsalgia, unspecified: Secondary | ICD-10-CM | POA: Diagnosis not present

## 2019-10-28 DIAGNOSIS — K5900 Constipation, unspecified: Secondary | ICD-10-CM

## 2019-10-28 MED ORDER — LACTULOSE 10 GM/15ML PO SOLN: ORAL | 11 refills | Status: AC

## 2019-10-28 NOTE — Progress Notes (Signed)
Subjective:    Patient ID: [name] Le, female    DOB: Jul 01, 1946, 74 y.o.   MRN: [id number]  HPI [name] Le is a pleasant 74 year old African-American female, known to Dr. Loletha Carrow who was last seen in 2018 when she had undergone EGD and colonoscopy for iron deficiency anemia. She comes in today with complaints of severe constipation.  Patient has history of hypertension, adult onset diabetes mellitus, chronic kidney disease and iron deficiency anemia. She was diagnosed with COVID-19 on 10/05/2019 and says she has been gradually improving. She says she has had some chronic problems with constipation however symptoms have been worse since November.  She says around that same time she developed mid back pain after bending over to clean her oven.  She says after that she had some difficulty walking for a couple of weeks without pain and some of that has persisted over the last couple of months.  She is also had radiation of discomfort around into the upper abdomen.  Initially she thought she had pulled a muscle.  She started taking Goody powders on a regular basis which did help somewhat with the pain.  She says her constipation has worsened since that time and she has gone as long as 2 weeks without a bowel movement. She had been treated by her PCP Dr. Harlan Stains, had a trial of MiraLAX without any benefit.  And apparently tried multiple over-the-counter products without much benefit.  Most recently she was given a prescription for lactulose 15 cc once daily and says that that has helped quite a bit.  If she takes the lactulose she is generally having a good soft bowel movement that has helped with some of her abdominal symptoms as well with less upper abdominal discomfort bloating etc.  He has not noted any melena or hematochezia.  She was told by Dr. Dema Severin that she should not stay on lactulose long-term. Last colonoscopy May 2018 was a normal exam. 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 Again patient says she is still having mid back pain, not as bad as it was a couple of months ago.  Review of Systems Pertinent positive and negative review of systems were noted in the above HPI section.  All other review of systems was otherwise negative.  Outpatient Encounter Medications as of 10/28/2019  Medication Sig  . albuterol (VENTOLIN HFA) 108 (90 Base) MCG/ACT inhaler Inhale 2 puffs into the lungs every 6 (six) hours as needed for wheezing or shortness of breath.  Marland Kitchen aspirin EC 81 MG tablet Take 81 mg by mouth daily.  . Aspirin-Acetaminophen-Caffeine (GOODY HEADACHE PO) Take 1 packet by mouth daily as needed (pain/headache).  . chlorthalidone (HYGROTON) 25 MG tablet Take 25 mg by mouth at bedtime.  Marland Kitchen glucose blood (ACCU-CHEK AVIVA PLUS) test strip Use as instructed 3-4x a day  . latanoprost (XALATAN) 0.005 % ophthalmic solution Place 1 drop into both eyes at bedtime.  . metFORMIN (GLUCOPHAGE) 500 MG tablet Take 1 tablet by mouth twice daily.  Marland Kitchen NOVOLOG MIX 70/30 FLEXPEN (70-30) 100 UNIT/ML FlexPen INJECT UNDER SKIN 30 MIN BEFORE BREAKFAST & 10-15 UNITS BEFORE DINNER (Patient taking differently: Inject 20-30 Units into the skin See admin instructions. Inject 30 units subcutaneously before breakfast and 20 units before supper)  . olmesartan (BENICAR) 40 MG tablet Take 40 mg by mouth daily at 12 noon.  Marland Kitchen OVER THE COUNTER MEDICATION  Take 5 mLs by mouth daily at 12 noon. Liquid enzyme - immune system boost  . pravastatin (PRAVACHOL) 80 MG tablet Take 80 mg by mouth at bedtime.   Marland Kitchen Propylene Glycol (SYSTANE BALANCE) 0.6 % SOLN Apply 1 drop to eye at bedtime.   Marland Kitchen lactulose (CHRONULAC) 10 GM/15ML solution Take 15-30 cc daily as needed for bowels  . [DISCONTINUED] metFORMIN (GLUCOPHAGE-XR) 500 MG 24 hr tablet Take 500 mg by mouth 2 (two) times daily.  . [DISCONTINUED] mometasone-formoterol (DULERA) 200-5 MCG/ACT AERO  Inhale 2 puffs into the lungs daily as needed for wheezing or shortness of breath.   . [DISCONTINUED] pantoprazole (PROTONIX) 40 MG tablet Take 1 tablet (40 mg total) by mouth daily. (Patient not taking: Reported on 10/05/2019)  . [DISCONTINUED] tiZANidine (ZANAFLEX) 2 MG tablet Take 2 mg by mouth at bedtime as needed for muscle spasms.    No facility-administered encounter medications on file as of 10/28/2019.   Allergies  Allergen Reactions  . Ace Inhibitors Cough   Patient Active Problem List   Diagnosis Date Noted  . Vitamin D insufficiency 02/13/2018  . Iron deficiency anemia due to chronic blood loss   . AVM (arteriovenous malformation) of small bowel, acquired   . Symptomatic anemia 02/04/2017  . Left shoulder pain 02/04/2017  . Chronic kidney disease, stage II (mild) 11/07/2015  . Chronic kidney disease, stage III (moderate) 11/07/2015  . 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 Hyperlipidemia 11/07/2015  . Type 2 diabetes mellitus with diabetic nephropathy ([place]) 11/07/2015  . Hypertensive chronic kidney disease with stage 1 through stage 4 chronic kidney disease, or unspecified chronic kidney disease 11/07/2015  . Hyperparathyroidism ([place]) 10/14/2015  . Essential hypertension 10/11/2015  . Hypercalcemia 03/29/2015  . Chronic cough 07/06/2014   Social History   Socioeconomic History  . Marital status: Married    Spouse name: Not on file  . Number of children: 1  . Years of education: Not on file  . Highest education level: Not on file  Occupational History  . Occupation: retired  Tobacco Use  . Smoking status: Never Smoker  . Smokeless tobacco: Never Used  Substance and Sexual Activity  . Alcohol use: Yes    Comment: wine occasional per pt.   . Drug use: No    Types: Marijuana    Comment: Previously smoked. Quit in 1981  . Sexual activity: Not on file  Other Topics Concern  . Not on file  Social History Narrative  . Not on file   Social Determinants of Health    Financial Resource Strain:   . Difficulty of Paying Living Expenses: Not on file  Food Insecurity:   . Worried About Charity fundraiser in the Last Year: Not on file  . Ran Out of Food in the Last Year: Not on file  Transportation Needs:   . Lack of Transportation (Medical): Not on file  . Lack of Transportation (Non-Medical): Not on file  Physical Activity:   . Days of Exercise per Week: Not on file  . Minutes of Exercise per Session: Not on file  Stress:   . Feeling of Stress : Not on file  Social Connections:   . Frequency of Communication with Friends and Family: Not on file  . Frequency of Social Gatherings with Friends and Family: Not on file  . Attends Religious Services: Not on file  . Active Member of Clubs or Organizations: Not on file  . Attends Archivist Meetings: Not on file  .  Marital Status: Not on file  Intimate Partner Violence:   . Fear of Current or Ex-Partner: Not on file  . Emotionally Abused: Not on file  . Physically Abused: Not on file  . Sexually Abused: Not on file    Ms. Burnsworth's family history includes Cancer in her brother and father; Heart disease in her sister; Lung cancer in her sister; Stroke in her mother.      Objective:    Vitals:   10/28/19 1143  BP: 138/80  Pulse: 100  Temp: 98.6 F (37 C)    Physical Exam.Well-developed well-nourished older African-American female in no acute distress.  Height, Weight, 179 BMI 28.8  HEENT; nontraumatic normocephalic, EOMI, PER R LA, sclera anicteric. Oropharynx; not examined Neck; supple, no JVD Cardiovascular; regular rate and rhythm with S1-S2, no murmur rub or gallop Pulmonary; Clear bilaterally patient pinpoints back pain to lower thoracic vertebrae Abdomen; soft, mild nonfocal bilateral upper abdominal tenderness no guarding or rebound nondistended, no palpable mass or hepatosplenomegaly, bowel sounds are active Rectal; not done today Skin; benign exam, no jaundice rash or  appreciable lesions Extremities; no clubbing cyanosis or edema skin warm and dry Neuro/Psych; alert and oriented x4, grossly nonfocal mood and affect appropriate       Assessment & Plan:   #2 74 year old African-American female with mild chronic constipation, worsened over the past 3-1/2 months.  This was also associated with new onset of mid back pain which has persisted.  I suspect she may have a thoracic compression fracture accounting for her back pain, which may have also contributed to worsening of constipation.  She is feeling better on lactulose 15 to 30 cc daily and having regular bowel movements.  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  AVMs felt to be etiology of iron deficiency anemia #4 chronic kidney disease stage II-III #5 adult onset diabetes mellitus #6 hypertension  Plan; okay to continue lactulose 15 to 30 cc p.o. daily for constipation, prescription refill sent Will check lower thoracic spine films. Follow-up with Dr. Loletha Carrow or myself on an as-needed basis. Further recommendations pending findings of above imaging.   Ural Acree S Vieno Tarrant PA-C 10/28/2019   Cc: Harlan Stains, MD

## 2019-10-28 NOTE — Patient Instructions (Signed)
If you are age 75 or older, your body mass index should be between 23-30. Your Body mass index is 28.89 kg/m. If this is out of the aforementioned range listed, please consider follow up with your Primary Care Provider.  If you are age 23 or younger, your body mass index should be between 19-25. Your Body mass index is 28.89 kg/m. If this is out of the aformentioned range listed, please consider follow up with your Primary Care Provider.   We have sent the following medications to your pharmacy for you to pick up at your convenience: Lactulose  Your provider has requested that you go to the basement level for an X-ray before leaving today. Press "B" on the elevator. The lab is located at the first door on the left as you exit the elevator.  We will call you with results.  Follow up as needed.  Thank you for choosing me and [facility].   Amy Esterwood, PA-C

## 2019-10-30 NOTE — Progress Notes (Signed)
____________________________________________________________  Attending physician addendum:  Thank you for sending this case to me. I have reviewed the entire note, and the outlined plan seems appropriate.  Chaeli Judy Danis, MD  ____________________________________________________________  

## 2019-11-20 ENCOUNTER — Other Ambulatory Visit: Payer: Self-pay | Admitting: Family Medicine

## 2019-11-20 DIAGNOSIS — K5900 Constipation, unspecified: Secondary | ICD-10-CM

## 2019-11-20 DIAGNOSIS — R140 Abdominal distension (gaseous): Secondary | ICD-10-CM

## 2019-11-27 ENCOUNTER — Telehealth: Payer: Self-pay | Admitting: Internal Medicine

## 2019-11-27 MED ORDER — PEN NEEDLES 32G X 5 MM MISC: 1.0000 | Freq: Two times a day (BID) | 0 refills | Status: DC

## 2019-11-27 NOTE — Telephone Encounter (Signed)
RX sent.  Please contact patient for follow up.

## 2019-11-27 NOTE — Telephone Encounter (Signed)
MEDICATION: novalog pen needles  PHARMACY:   [id number] - [place], [place] - [address] Phone:  [phone]  Fax:  [phone]       IS THIS A 90 DAY SUPPLY : yes  IS PATIENT OUT OF MEDICATION: yes  IF NOT; HOW MUCH IS LEFT:   LAST APPOINTMENT DATE: 04/30/2019  NEXT APPOINTMENT DATE:  DO WE HAVE YOUR PERMISSION TO LEAVE A DETAILED MESSAGE:  OTHER COMMENTS: Patient's pharmacy called stating the patient told the pharmacy that "her doctor told her she doesn't need pen needle refills and do keep reusing the same ones and just clean them off".   **Let patient know to contact pharmacy at the end of the day to make sure medication is ready. **  ** Please notify patient to allow 48-72 hours to process**  **Encourage patient to contact the pharmacy for refills or they can request refills through [facility]**

## 2019-11-27 NOTE — Telephone Encounter (Signed)
Last office visit 04/30/2019  Cancel/No-show? 2  Future office visit scheduled? No  Please advise on refill.

## 2019-11-27 NOTE — Telephone Encounter (Signed)
OK to send refill for 3 mo - needs an appt within next 3 mo

## 2019-12-03 ENCOUNTER — Telehealth: Payer: Self-pay | Admitting: Internal Medicine

## 2019-12-03 NOTE — Telephone Encounter (Signed)
Needs a visit to go over sugars and regimen

## 2019-12-03 NOTE — Telephone Encounter (Signed)
Patient called and she is on Novolog. It was changed at her last visit and the cost of the medication has been $100 per month since.  Patient states is not taking medication as directed on RX and wont because of the cost - She is taking medication 15 units before breakfast and 15 before supper.  Patient states is not going to pay $100 a month and requests that he medication instructions and dosing be changed back to the 15 units and 15 units instead of 30 units and 20 units.  Patient can be contacted at [phone]

## 2019-12-04 ENCOUNTER — Other Ambulatory Visit: Payer: Medicare Other

## 2019-12-09 NOTE — Telephone Encounter (Signed)
Pt is set for appt on 12/17/19

## 2019-12-17 ENCOUNTER — Other Ambulatory Visit: Payer: Self-pay

## 2019-12-17 ENCOUNTER — Telehealth (INDEPENDENT_AMBULATORY_CARE_PROVIDER_SITE_OTHER): Payer: Medicare Other | Admitting: Internal Medicine

## 2019-12-17 DIAGNOSIS — E1121 Type 2 diabetes mellitus with diabetic nephropathy: Secondary | ICD-10-CM

## 2019-12-17 DIAGNOSIS — E559 Vitamin D deficiency, unspecified: Secondary | ICD-10-CM | POA: Diagnosis not present

## 2019-12-17 DIAGNOSIS — E049 Nontoxic goiter, unspecified: Secondary | ICD-10-CM | POA: Diagnosis not present

## 2019-12-17 DIAGNOSIS — E213 Hyperparathyroidism, unspecified: Secondary | ICD-10-CM | POA: Diagnosis not present

## 2019-12-17 DIAGNOSIS — Z794 Long term (current) use of insulin: Secondary | ICD-10-CM

## 2019-12-17 MED ORDER — NOVOLOG MIX 70/30 FLEXPEN (70-30) 100 UNIT/ML ~~LOC~~ SUPN: PEN_INJECTOR | SUBCUTANEOUS | 3 refills | Status: DC

## 2019-12-17 NOTE — Patient Instructions (Signed)
Please continue: - Metformin ER 500 mg 2x a day with meals - Novolog 70/30: - 20 units before b'fast - 10 units before dinner  Please make sure you are taking vitamin D3 2000 units daily.  Please return in 3-4 months with your sugar log.

## 2019-12-17 NOTE — Progress Notes (Signed)
Patient location: Home My location: Office Persons participating in the virtual visit: patient, provider  Referring Provider: Harlan Stains, MD  I connected with the patient on 12/17/19 at 8:50 am by telephone and verified that I am speaking with the correct person.   I discussed the limitations of evaluation and management by telephone and the availability of in person appointments. The patient expressed understanding and agreed to proceed.   Details of the encounter are shown below.  HPI: [name] Le is a 74 y.o.-year-old female, presenting for follow-up for DM2, dx in early 2000s, insulin-dependent since 2009, uncontrolled, with complications (CKD stage III, PN), primary hyperparathyroidism, and multinodular goiter.  Last visit 8 months ago (virtual).  She was in the emergency room 09/2019 with COVID-19. She is still not feeling back to normal.  Primary hyperparathyroidism  -She refuses surgery -Of note, a technetium sestamibi parathyroid scan (08/2014) was nonlocalizing -24-hour urine calcium was at the upper limit of normal  Reviewed pertinent labs: Lab Results  Component Value Date   CALCIUM 11.2 (H) 10/05/2019   PTH 45 04/09/2017   PTH Comment 04/09/2017  06/10/2018: -Glucose 89, BUN/creatinine 21/1.19, EGFR 54 -Calcium 11.8, corrected 11.7 (8.6-10.3)  Latest vitamin D level was low: Lab Results  Component Value Date   VD25OH 27.54 (L) 04/09/2017   At last visit she was off the recommended dose of 2000 units vitamin D daily.  I advised her to restart.  She has osteopenia per review of her DXA scan report from 08/2018.  Multinodular goiter s/p FNA x3 in 12/2013: Benign  Pt denies: - feeling nodules in neck - hoarseness - dysphagia - choking - SOB with lying down  DM2:  Reviewed HbA1c levels: Lab Results  Component Value Date   HGBA1C 8.7 (A) 10/14/2018   HGBA1C 10.1 (A) 06/27/2018   HGBA1C 7.8 10/22/2017  08/06/2017: HbA1c 8.8%  Patient is   on: - Metformin ER 500 mg 2x a day with meals - Novolog 70/30: - 30 units before b'fast >> 25 >> 30 >> 20 units - 20 units before dinner 25 >> 20 >> 10 to 15 units >> 10 units now  Pt checks her sugars once a day: - 11-11:30: 96-155 (icecream)  >> 48 (took insulin at bedtime), 66, 90-196 >> 74-140 - before lunch: n/c - 2h after lunch: n/c >> 155, 351 (pancakes) >> n/c - before dinner: n/c - 2h after dinner: 150s >> 153-204 >> 82, 90 >> 135-140 - bedtime: n/c >> 96 >> n/c - nighttime: n/c >> occasionally 55 x3 >> n/c Lowest sugar was 40s >>...55 >> 48 >> 74; she has hypoglycemia awareness in the 60s. Highest sugar was 204 >> 351 >> 145.  Glucometer: Accuchek Aviva  Pt's meals are: - Brunch: 7 layer salad, ham, [place]; cereal; egg + bacon + apple butter + OJ - Dinner: sandwich with meat; bowl of jello - Snacks: icecream, cookies, potato chips - after dinner, at 12 am-1 am  -+ Mild CKD, last BUN/creatinine:  Lab Results  Component Value Date   BUN 28 (H) 10/05/2019   BUN 16 02/05/2017   CREATININE 1.70 (H) 10/05/2019   CREATININE 1.08 (H) 02/05/2017  06/10/2018: Glucose 89, BUN/creatinine 21/1.19, EGFR 54 Previously on losartan, now off  -+ HL; lipids not available for review: No results found for: CHOL, HDL, LDLCALC, LDLDIRECT, TRIG, CHOLHDL  On Pravastatin.  - last eye exam was in 12/2019: Reportedly no DR; + glaucoma; she has blepharoptosis and needs surgery.  -She denies numbness  but has tingling in her feet  No family history of diabetes.  ROS: Constitutional: no weight gain/+ weight loss, + fatigue, no subjective hyperthermia, no subjective hypothermia Eyes: no blurry vision, no xerophthalmia ENT: no sore throat, + see HPI Cardiovascular: no CP/no SOB/no palpitations/no leg swelling Respiratory: no cough/no SOB/no wheezing Gastrointestinal: no N/no V/no D/no C/no acid reflux Musculoskeletal: no muscle aches/no joint aches Skin: no rashes, no hair  loss Neurological: no tremors/no numbness/no tingling/no dizziness  I reviewed pt's medications, allergies, PMH, social hx, family hx, and changes were documented in the history of present illness. Otherwise, unchanged from my initial visit note.  Past Medical History:  Diagnosis Date  . CKD (chronic kidney disease), stage III   . Diabetes mellitus without complication ([place])   . High cholesterol   . Hypertension   . Nontoxic goiter   . Symptomatic anemia 02/04/2017   Past Surgical History:  Procedure Laterality Date  . ABDOMINAL HYSTERECTOMY    . COLONOSCOPY WITH PROPOFOL N/A 02/05/2017   Procedure: COLONOSCOPY WITH PROPOFOL;  Surgeon: Doran Stabler, MD;  Location: [facility];  Service: Endoscopy;  Laterality: N/A;  . ESOPHAGOGASTRODUODENOSCOPY (EGD) WITH PROPOFOL N/A 02/05/2017   Procedure: ESOPHAGOGASTRODUODENOSCOPY (EGD) WITH PROPOFOL;  Surgeon: Doran Stabler, MD;  Location: [facility];  Service: Endoscopy;  Laterality: N/A;  . REPLACEMENT TOTAL KNEE Right    Social History   Socioeconomic History  . Marital status: Married    Spouse name: Not on file  . Number of children: 1  . Years of education: Not on file  . Highest education level: Not on file  Occupational History  . Occupation: retired  Tobacco Use  . Smoking status: Never Smoker  . Smokeless tobacco: Never Used  Substance and Sexual Activity  . Alcohol use: Yes    Comment: wine occasional per pt.   . Drug use: No    Types: Marijuana    Comment: Previously smoked. Quit in 1981  . Sexual activity: Not on file  Other Topics Concern  . Not on file  Social History Narrative  . Not on file   Social Determinants of Health   Financial Resource Strain:   . Difficulty of Paying Living Expenses:   Food Insecurity:   . Worried About Charity fundraiser in the Last Year:   . Arboriculturist in the Last Year:   Transportation Needs:   . Film/video editor (Medical):   Marland Kitchen Lack of Transportation  (Non-Medical):   Physical Activity:   . Days of Exercise per Week:   . Minutes of Exercise per Session:   Stress:   . Feeling of Stress :   Social Connections:   . Frequency of Communication with Friends and Family:   . Frequency of Social Gatherings with Friends and Family:   . Attends Religious Services:   . Active Member of Clubs or Organizations:   . Attends Archivist Meetings:   Marland Kitchen Marital Status:   Intimate Partner Violence:   . Fear of Current or Ex-Partner:   . Emotionally Abused:   Marland Kitchen Physically Abused:   . Sexually Abused:    Current Outpatient Medications on File Prior to Visit  Medication Sig Dispense Refill  . albuterol (VENTOLIN HFA) 108 (90 Base) MCG/ACT inhaler Inhale 2 puffs into the lungs every 6 (six) hours as needed for wheezing or shortness of breath.    Marland Kitchen aspirin EC 81 MG tablet Take 81 mg by mouth daily.    Marland Kitchen  Aspirin-Acetaminophen-Caffeine (GOODY HEADACHE PO) Take 1 packet by mouth daily as needed (pain/headache).    . chlorthalidone (HYGROTON) 25 MG tablet Take 25 mg by mouth at bedtime.    Marland Kitchen glucose blood (ACCU-CHEK AVIVA PLUS) test strip Use as instructed 3-4x a day 200 each 12  . Insulin Pen Needle (PEN NEEDLES) 32G X 5 MM MISC 1 each by Does not apply route in the morning and at bedtime. Use with insulin pen 2 times a day.DUE FOR APPOINTMENT 200 each 0  . lactulose (CHRONULAC) 10 GM/15ML solution Take 15-30 cc daily as needed for bowels 236 mL 11  . latanoprost (XALATAN) 0.005 % ophthalmic solution Place 1 drop into both eyes at bedtime.    . metFORMIN (GLUCOPHAGE) 500 MG tablet Take 1 tablet by mouth twice daily. 60 tablet 2  . NOVOLOG MIX 70/30 FLEXPEN (70-30) 100 UNIT/ML FlexPen INJECT UNDER SKIN 30 MIN BEFORE BREAKFAST & 10-15 UNITS BEFORE DINNER (Patient taking differently: Inject 20-30 Units into the skin See admin instructions. Inject 30 units subcutaneously before breakfast and 20 units before supper) 15 pen 14  . olmesartan (BENICAR) 40 MG  tablet Take 40 mg by mouth daily at 12 noon.    Marland Kitchen OVER THE COUNTER MEDICATION Take 5 mLs by mouth daily at 12 noon. Liquid enzyme - immune system boost    . pravastatin (PRAVACHOL) 80 MG tablet Take 80 mg by mouth at bedtime.   5  . Propylene Glycol (SYSTANE BALANCE) 0.6 % SOLN Apply 1 drop to eye at bedtime.      No current facility-administered medications on file prior to visit.   Allergies  Allergen Reactions  . Ace Inhibitors Cough   Family History  Problem Relation Age of Onset  . Stroke Mother   . Cancer Father        pts states cancer in back  . Lung cancer Sister   . Heart disease Sister   . Cancer Brother        "  . Colon cancer Neg Hx     PE: There were no vitals taken for this visit. Wt Readings from Last 3 Encounters:  10/28/19 179 lb (81.2 kg)  10/05/19 185 lb (83.9 kg)  10/14/18 185 lb (83.9 kg)   Constitutional:  in NAD  The physical exam was not performed (phone visit).  ASSESSMENT: 1. DM2, insulin-dependent, uncontrolled, with complications - CKD stage 3  2. Primary  HPTH  3. Vitamin D deficiency  4. MNG   PLAN:  1. Patient with uncontrolled type 2 diabetes, with latest HbA1c 8.7%, stable, high, at last check 2 months ago.  She is on a premixed insulin regimen along with Metformin ER.  In the past, she was taking her second dose of insulin of the day at bedtime and was not taking it if sugars were at goal at bedtime.  We discussed about moving this before dinner and to take it if she ate dinner.  At last visit, we decreased the dose slightly to avoid low blood sugars at night.  Sugars were lower after dinner in the morning then. - at this visit, her sugars are great, on lower insulin doses, but she scheduled this appt to discuss her insulin regimen.  She is currently using approximately 20 units in the morning (occasionally increasing it to 30 units) and approximately 10 units before dinner.  Reviewing her sugars at home per her recall, they are at  goal for age.  We will not change this regimen.  However, she mentions that the way the prescription was written for her NovoLog this has now doubled in price.  I rewrote her prescription and I am hoping that she can now obtain it at a lower price ($50, compared to $100 previously) - I suggested to:  Patient Instructions  Please continue: - Metformin ER 500 mg 2x a day with meals - Novolog 70/30: - 20 units before b'fast - 10 units before dinner  Please make sure you are taking vitamin D3 2000 units daily.  Please return in 3-4 months with your sugar log.    - we we will recheck her HbA1c at next visit - advised to check sugars at different times of the day - 2x a day, rotating check times - advised for yearly eye exams >> she is UTD - return to clinic in 3-4 months     2. Primary  HPTH and 3. vitamin D deficiency -Reviewed her most recent PTH, calcium, vitamin D level -calcium was 11.8, high, in 06/2018 and 11.2 09/2019 -We will need to recheck TSH when she comes to the clinic -She was previously on 2000 units vitamin D daily but stopped last year.  I again advised her to restart it at 2000 units at last visit. -Of note, the initial sestamibi parathyroid scan was nonlocalizing in 2015 -We discussed about parathyroid surgery but she refuses  4. MNG -Previous nodule biopsies were benign -She does have a large goiter but denies neck compression symptoms -She is reticent to consider surgery so for now we will follow her clinically  - time spent with the patient: 15 minutes, of which >50% was spent in obtaining information about her symptoms, reviewing her previous labs, evaluations, and treatments, counseling her about her conditions (please see the discussed topics above), and developing a plan to further investigate and treat them; she had a number of questions which I addressed.  Philemon Kingdom, MD PhD [facility]

## 2019-12-18 ENCOUNTER — Ambulatory Visit
Admission: RE | Admit: 2019-12-18 | Discharge: 2019-12-18 | Disposition: A | Discharge status: Home / Self Care | Payer: Medicare Other | Source: Ambulatory Visit | Attending: Family Medicine | Admitting: Family Medicine

## 2019-12-18 DIAGNOSIS — K5900 Constipation, unspecified: Secondary | ICD-10-CM

## 2019-12-18 DIAGNOSIS — R140 Abdominal distension (gaseous): Secondary | ICD-10-CM

## 2020-02-19 ENCOUNTER — Encounter: Payer: Self-pay | Admitting: Internal Medicine

## 2020-02-19 NOTE — Progress Notes (Signed)
Received labs from Dr.  Kathrine Haddock with nephrology. 02/15/2020: Calcium 12.3 (8.7-10.3)!!!,  GFR 36, glucose 96, phosphorus 2.2 (3.0-4.3), Albumin 4.4 (3.7-1.7) Unfortunately, she continues to refuse surgery for her hyperparathyroidism. We will advise her to stay very well-hydrated.  She is not a good candidate for bisphosphonates due to the decreased GFR.

## 2020-02-19 NOTE — Progress Notes (Signed)
Left message for patient to return our call at [phone].  

## 2020-02-23 NOTE — Progress Notes (Signed)
Called patient, no answer and this time VM is full.

## 2020-02-23 NOTE — Progress Notes (Signed)
Patient called back, I gave her the message. Patient states she was taken off the Chlorthalidone a week ago and is having repeat labs next week.  Patient will have those labs also sent to [place], she is still unsure about surgery.

## 2020-03-04 ENCOUNTER — Encounter: Payer: Self-pay | Admitting: Internal Medicine

## 2020-03-04 NOTE — Progress Notes (Signed)
Received labs from PCP from 03/03/2020: -HbA1c 7.0% -CMP: Normal, with the following exceptions: Glucose 133, BUN/creatinine 23/1.39, GFR 45, corrected calcium 11.3 (8.6-10.3)

## 2020-04-25 ENCOUNTER — Other Ambulatory Visit: Payer: Self-pay | Admitting: Internal Medicine

## 2020-06-14 ENCOUNTER — Encounter: Payer: Self-pay | Admitting: Internal Medicine

## 2020-06-14 NOTE — Progress Notes (Signed)
Received labs from PCP from 06/10/2020: Glucose 63, BUN/creatinine 26/1.52, GFR 40 Calcium 12.2 She adamantly refused surgery for hyperparathyroidism in the past.  I will discuss with her again about it when she comes for the next appointment.

## 2020-06-15 ENCOUNTER — Telehealth: Payer: Self-pay

## 2020-06-15 NOTE — Telephone Encounter (Signed)
It is ok to continue off Metformin for now and monitor sugars. Let me know if they increase. She may be doing just fine on the insulin regimen only.

## 2020-06-15 NOTE — Telephone Encounter (Signed)
Patient stop taking Metformin about a week ago. Patient states that she feels much better off the medication and even feels that her eye sight is better.  Patient would like to have a alternative medication. Patient sugar this morning was in 200. Please advise

## 2020-06-15 NOTE — Telephone Encounter (Signed)
Patient has been advise of plan of care and agrees

## 2020-06-15 NOTE — Telephone Encounter (Signed)
New message    Pt c/o medication issue:  1. Name of Medication: metFORMIN (GLUCOPHAGE) 500 MG tablet - stop taken about a week and run out medication   2. How are you currently taking this medication (dosage and times per day)? Twice a day   3. Are you having a reaction (difficulty breathing--STAT)? No   4. What is your medication issue? Feel much better with out taken medication even her eye sight better.   5. Asking for an alternative medication    6. Blood sugar this morning: 200  Retake around noon .

## 2020-06-28 ENCOUNTER — Telehealth: Payer: Self-pay

## 2020-06-28 NOTE — Telephone Encounter (Signed)
Received fax from overnight call, that patient called in and wanted to know if Dr.Gherghe would be changing her metformin medication. I did call patient to advise of previous message from last week, to stay off the Metformin for now and to monitor sugars. I left message for her to call us back if the sugars are increasing and I would let Dr.Gherghe know.   Left call back numbers.

## 2020-08-25 ENCOUNTER — Other Ambulatory Visit: Payer: Medicare Other

## 2020-08-25 DIAGNOSIS — Z20822 Contact with and (suspected) exposure to covid-19: Secondary | ICD-10-CM

## 2020-08-26 ENCOUNTER — Ambulatory Visit: Payer: Medicare Other | Attending: Internal Medicine

## 2020-08-26 DIAGNOSIS — Z23 Encounter for immunization: Secondary | ICD-10-CM

## 2020-08-26 LAB — SARS-COV-2, NAA 2 DAY TAT

## 2020-08-26 LAB — NOVEL CORONAVIRUS, NAA: SARS-CoV-2, NAA: NOT DETECTED

## 2020-08-26 NOTE — Progress Notes (Signed)
   Covid-19 Vaccination Clinic  Name:  [name] Le    MRN: [id number] DOB: [birth year]  08/26/2020  [name] Le was observed post Covid-19 immunization for 15 minutes without incident. She was provided with Vaccine Information Sheet and instruction to access the V-Safe system.   [name] Le was instructed to call 911 with any severe reactions post vaccine: Marland Kitchen Difficulty breathing  . Swelling of face and throat  . A fast heartbeat  . A bad rash all over body  . Dizziness and weakness   Immunizations Administered    Name Date Dose VIS Date Route   Pfizer COVID-19 Vaccine 08/26/2020  1:37 PM 0.3 mL 06/29/2020 Intramuscular   Manufacturer: [place]   Lot: [id number]   [place]: [id number]

## 2020-09-21 ENCOUNTER — Telehealth: Payer: Self-pay | Admitting: Internal Medicine

## 2020-09-21 ENCOUNTER — Other Ambulatory Visit: Payer: Self-pay | Admitting: Internal Medicine

## 2020-09-21 NOTE — Telephone Encounter (Signed)
NA

## 2020-11-19 ENCOUNTER — Other Ambulatory Visit: Payer: Self-pay | Admitting: Internal Medicine

## 2020-11-23 ENCOUNTER — Telehealth: Payer: Self-pay | Admitting: Gastroenterology

## 2020-11-23 NOTE — Telephone Encounter (Signed)
I received some lab reports from this patient's PCP, Dr. Harlan Stains.  (For documentation purposes, WBC 5.2, hemoglobin 10.5, hematocrit 33, MCV 87, platelet 210 BUN 24, creatinine 1.5, calcium 11.0 Iron 66, TIBC 375, 70% saturation AST 11, ALT 9)  I had previously seen this patient for iron deficiency anemia and later for constipation.  Please contact Dr. Caren Griffins White's office and see if they are requesting a clinic visit for this patient or if they were just copying me on the reports for our records.  If patient is in need of a visit with [place], please make the arrangements with me or AE (who saw the patient in 2021)  - HD

## 2020-11-23 NOTE — Telephone Encounter (Signed)
Spoke with Arianna at Dr. Orest Dikes office, the lab results were sent over per the patient's request. No office visit requested.

## 2020-11-24 ENCOUNTER — Telehealth: Payer: Self-pay | Admitting: Internal Medicine

## 2020-11-24 DIAGNOSIS — E1121 Type 2 diabetes mellitus with diabetic nephropathy: Secondary | ICD-10-CM

## 2020-11-24 DIAGNOSIS — Z794 Long term (current) use of insulin: Secondary | ICD-10-CM

## 2020-11-24 NOTE — Telephone Encounter (Signed)
Patient called and is requesting that the Novolog 70/30 Flexpen be called for a 30 day supply not a 50 day supply because of the cost.  Pharmacy is [facility] on [address]  Phone # [phone]

## 2020-11-28 NOTE — Telephone Encounter (Signed)
Called and confirmed with pt she is taking 30 units in the morning and 20 units at night. Last OV notes have pt taking 20 units in the morning 10 units at night. Ok to update Rx

## 2020-11-28 NOTE — Telephone Encounter (Signed)
Ok. Needs an appt. If not already scheduled. C

## 2020-11-29 NOTE — Telephone Encounter (Signed)
Can pt be contacted to schedule appt.

## 2020-11-30 NOTE — Telephone Encounter (Signed)
LMTCB to schedule appt

## 2020-12-02 ENCOUNTER — Encounter: Payer: Self-pay | Admitting: Internal Medicine

## 2020-12-02 NOTE — Progress Notes (Signed)
Received labs from PCP from Had labs 08/18/2020: Calcium 11.2, albumin 4.3, magnesium 2.3 BUN/creatinine 35/1.53, GFR 38, glucose 153 hemoglobin 10.1  Received labs from PCP from 11/16/2020: Calcium 11.03 (8.6-10.3), Albumin 4.0, BUN/creatinine 24/1.51, GFR 41 Hemoglobin 10.5 (12-16), iron saturation 17 (20 to 55%)

## 2020-12-16 ENCOUNTER — Other Ambulatory Visit: Payer: Self-pay | Admitting: Internal Medicine

## 2020-12-16 DIAGNOSIS — E1121 Type 2 diabetes mellitus with diabetic nephropathy: Secondary | ICD-10-CM

## 2020-12-16 DIAGNOSIS — Z794 Long term (current) use of insulin: Secondary | ICD-10-CM

## 2020-12-16 NOTE — Telephone Encounter (Signed)
New message     1. Which medications need to be refilled? (please list name of each medication and dose if known)   insulin aspart protamine - aspart (NOVOLOG MIX 70/30 FLEXPEN) (70-30) 100 UNIT/ML FlexPen  Insulin Pen Needle (PEN NEEDLES) 32G X 5 MM MISC  glucose blood (ACCU-CHEK AVIVA PLUS) test strip  2. Which pharmacy/location (including street and city if local pharmacy) is medication to be sent to?[id number] [place], [place]

## 2020-12-19 MED ORDER — PEN NEEDLES 32G X 5 MM MISC: 1.0000 | Freq: Two times a day (BID) | 0 refills | Status: DC

## 2020-12-19 MED ORDER — NOVOLOG MIX 70/30 FLEXPEN (70-30) 100 UNIT/ML ~~LOC~~ SUPN: PEN_INJECTOR | SUBCUTANEOUS | 0 refills | Status: DC

## 2020-12-19 NOTE — Telephone Encounter (Signed)
Rx sent to preferred pharmacy.

## 2021-01-13 ENCOUNTER — Other Ambulatory Visit (HOSPITAL_COMMUNITY): Payer: Self-pay | Admitting: Surgery

## 2021-01-13 ENCOUNTER — Other Ambulatory Visit: Payer: Self-pay | Admitting: Surgery

## 2021-01-13 DIAGNOSIS — E049 Nontoxic goiter, unspecified: Secondary | ICD-10-CM

## 2021-01-13 DIAGNOSIS — E210 Primary hyperparathyroidism: Secondary | ICD-10-CM

## 2021-01-13 DIAGNOSIS — E042 Nontoxic multinodular goiter: Secondary | ICD-10-CM

## 2021-01-30 ENCOUNTER — Other Ambulatory Visit: Payer: Self-pay

## 2021-01-30 ENCOUNTER — Ambulatory Visit (HOSPITAL_COMMUNITY)
Admission: RE | Admit: 2021-01-30 | Discharge: 2021-01-30 | Disposition: A | Discharge status: Home / Self Care | Payer: Medicare Other | Source: Ambulatory Visit | Attending: Surgery | Admitting: Surgery

## 2021-01-30 DIAGNOSIS — E042 Nontoxic multinodular goiter: Secondary | ICD-10-CM

## 2021-01-30 DIAGNOSIS — E049 Nontoxic goiter, unspecified: Secondary | ICD-10-CM

## 2021-01-30 DIAGNOSIS — E210 Primary hyperparathyroidism: Secondary | ICD-10-CM

## 2021-02-02 ENCOUNTER — Encounter (HOSPITAL_COMMUNITY)
Admission: RE | Admit: 2021-02-02 | Discharge: 2021-02-02 | Disposition: A | Discharge status: Home / Self Care | Payer: Medicare Other | Source: Ambulatory Visit | Attending: Surgery | Admitting: Surgery

## 2021-02-02 ENCOUNTER — Other Ambulatory Visit: Payer: Self-pay

## 2021-02-02 DIAGNOSIS — E210 Primary hyperparathyroidism: Secondary | ICD-10-CM | POA: Diagnosis present

## 2021-02-02 MED ORDER — TECHNETIUM TC 99M SESTAMIBI - CARDIOLITE
27.0000 | Freq: Once | INTRAVENOUS | Status: AC | PRN
  Administered 2021-02-02: 27 via INTRAVENOUS

## 2021-02-20 ENCOUNTER — Other Ambulatory Visit: Payer: Self-pay | Admitting: Surgery

## 2021-02-20 DIAGNOSIS — E041 Nontoxic single thyroid nodule: Secondary | ICD-10-CM

## 2021-02-22 ENCOUNTER — Ambulatory Visit
Admission: RE | Admit: 2021-02-22 | Discharge: 2021-02-22 | Disposition: A | Discharge status: Home / Self Care | Payer: Medicare Other | Source: Ambulatory Visit | Attending: Surgery | Admitting: Surgery

## 2021-02-22 ENCOUNTER — Other Ambulatory Visit (HOSPITAL_COMMUNITY)
Admission: RE | Admit: 2021-02-22 | Discharge: 2021-02-22 | Disposition: A | Discharge status: Home / Self Care | Payer: Medicare Other | Source: Ambulatory Visit | Attending: Surgery | Admitting: Surgery

## 2021-02-22 ENCOUNTER — Ambulatory Visit: Payer: Self-pay | Admitting: Surgery

## 2021-02-22 DIAGNOSIS — E041 Nontoxic single thyroid nodule: Secondary | ICD-10-CM | POA: Diagnosis present

## 2021-02-24 LAB — CYTOLOGY - NON PAP

## 2021-02-27 NOTE — Patient Instructions (Addendum)
DUE TO COVID-19 ONLY ONE VISITOR IS ALLOWED TO COME WITH YOU AND STAY IN THE WAITING ROOM ONLY DURING PRE OP AND PROCEDURE DAY OF SURGERY. THE 1 VISITOR  MAY VISIT WITH YOU AFTER SURGERY IN YOUR PRIVATE ROOM DURING VISITING HOURS ONLY!  YOU NEED TO HAVE A COVID 19 TEST ON Thursday 03/02/2021  @ 1:00 pm, THIS TEST MUST BE DONE BEFORE SURGERY,  COVID TESTING SITE [place] [address], IT IS ON THE RIGHT GOING OUT [address] APPROXIMATELY  2 MINUTES PAST [employer] ON THE RIGHT. ONCE YOUR COVID TEST IS COMPLETED,  PLEASE BEGIN THE QUARANTINE INSTRUCTIONS AS OUTLINED IN YOUR HANDOUT.                [name] Le  02/27/2021   Your procedure is scheduled on: Monday March 06, 2021   Report to [facility] Main  Entrance   Report to admitting at 0730 AM     Call this number if you have problems the morning of surgery [phone]    Remember: Do not eat food After Midnight. May have clear liquids from midnight till 0630 consuming entire presurgery G2 drink by 0630 day of surgery then nothing by mouth.   BRUSH YOUR TEETH MORNING OF SURGERY AND RINSE YOUR MOUTH OUT, NO CHEWING GUM CANDY OR MINTS.     Take these medicines the morning of surgery with A SIP OF WATER: Hydralazine, may use albuterol inhaler if needed (bring with you day of surgery), Symbicort Inhaler if needed (bring with you day of surgery), Metoprolol, May use eye drops if needed   DO NOT TAKE ANY DIABETIC MEDICATIONS DAY OF YOUR SURGERY                               You may not have any metal on your body including hair pins and              piercings  Do not wear jewelry, make-up, lotions, powders or perfumes, deodorant             Do not wear nail polish on your fingernails.  Do not shave  48 hours prior to surgery.     Do not bring valuables to the hospital. [place].  Contacts, dentures or bridgework may not be worn into  surgery.  Leave suitcase in the car. After surgery it may be brought to your room.     Patients discharged the day of surgery will not be allowed to drive home. IF YOU ARE HAVING SURGERY AND GOING HOME THE SAME DAY, YOU MUST HAVE AN ADULT TO DRIVE YOU HOME AND BE WITH YOU FOR 24 HOURS. YOU MAY GO HOME BY TAXI OR UBER OR ORTHERWISE, BUT AN ADULT MUST ACCOMPANY YOU HOME AND STAY WITH YOU FOR 24 HOURS.  _____________________________________________________________________                CLEAR LIQUID DIET   Foods Allowed  Foods Excluded  Coffee and tea, regular and decaf                             liquids that you cannot  Plain Jell-O any favor except red or purple                                           see through such as: Fruit ices (not with fruit pulp)                                     milk, soups, orange juice  Iced Popsicles                                    All solid food Carbonated beverages, regular and diet                                    Cranberry, grape and apple juices Sports drinks like Gatorade Lightly seasoned clear broth or consume(fat free) Sugar, honey syrup  Sample Menu Breakfast                                Lunch                                     Supper Cranberry juice                    Beef broth                            Chicken broth Jell-O                                     Grape juice                           Apple juice Coffee or tea                        Jell-O                                      Popsicle                                                Coffee or tea                        Coffee or tea  _____________________________________________________________________  [facility] - Preparing for Surgery Before surgery, you can play an important role.  Because skin is not sterile, your skin  needs to be as free of germs as possible.  You can reduce the number of germs  on your skin by washing with CHG (chlorahexidine gluconate) soap before surgery.  CHG is an antiseptic cleaner which kills germs and bonds with the skin to continue killing germs even after washing. Please DO NOT use if you have an allergy to CHG or antibacterial soaps.  If your skin becomes reddened/irritated stop using the CHG and inform your nurse when you arrive at Short Stay. Do not shave (including legs and underarms) for at least 48 hours prior to the first CHG shower.  You may shave your face/neck. Please follow these instructions carefully:  1.  Shower with CHG Soap the night before surgery and the  morning of Surgery.  2.  If you choose to wash your hair, wash your hair first as usual with your  normal  shampoo.  3.  After you shampoo, rinse your hair and body thoroughly to remove the  shampoo.                           4.  Use CHG as you would any other liquid soap.  You can apply chg directly  to the skin and wash                       Gently with a scrungie or clean washcloth.  5.  Apply the CHG Soap to your body ONLY FROM THE NECK DOWN.   Do not use on face/ open                           Wound or open sores. Avoid contact with eyes, ears mouth and genitals (private parts).                       Wash face,  Genitals (private parts) with your normal soap.             6.  Wash thoroughly, paying special attention to the area where your surgery  will be performed.  7.  Thoroughly rinse your body with warm water from the neck down.  8.  DO NOT shower/wash with your normal soap after using and rinsing off  the CHG Soap.                9.  Pat yourself dry with a clean towel.            10.  Wear clean pajamas.            11.  Place clean sheets on your bed the night of your first shower and do not  sleep with pets. Day of Surgery : Do not apply any lotions/deodorants the morning of surgery.  Please wear clean clothes to the hospital/surgery center.  FAILURE TO FOLLOW THESE INSTRUCTIONS  MAY RESULT IN THE CANCELLATION OF YOUR SURGERY PATIENT SIGNATURE_________________________________  NURSE SIGNATURE__________________________________  ________________________________________________________________________ How to Manage Your Diabetes Before and After Surgery  Why is it important to control my blood sugar before and after surgery? Improving blood sugar levels before and after surgery helps healing and can limit problems. A way of improving blood sugar control is eating a healthy diet by:  Eating less sugar and carbohydrates  Increasing activity/exercise  Talking with your doctor about reaching your blood sugar goals High blood sugars (greater than 180 mg/dL) can raise  your risk of infections and slow your recovery, so you will need to focus on controlling your diabetes during the weeks before surgery. Make sure that the doctor who takes care of your diabetes knows about your planned surgery including the date and location.  How do I manage my blood sugar before surgery? Check your blood sugar at least 4 times a day, starting 2 days before surgery, to make sure that the level is not too high or low. Check your blood sugar the morning of your surgery when you wake up and every 2 hours until you get to the Short Stay unit. If your blood sugar is less than 70 mg/dL, you will need to treat for low blood sugar: Do not take insulin. Treat a low blood sugar (less than 70 mg/dL) with  cup of clear juice (cranberry or apple), 4 glucose tablets, OR glucose gel. Recheck blood sugar in 15 minutes after treatment (to make sure it is greater than 70 mg/dL). If your blood sugar is not greater than 70 mg/dL on recheck, call [phone] for further instructions. Report your blood sugar to the short stay nurse when you get to Short Stay.  If you are admitted to the hospital after surgery: Your blood sugar will be checked by the staff and you will probably be given insulin after surgery  (instead of oral diabetes medicines) to make sure you have good blood sugar levels. The goal for blood sugar control after surgery is 80-180 mg/dL.   WHAT DO I DO ABOUT MY DIABETES MEDICATION?  Do not take oral diabetes medicines (pills) the morning of surgery.  THE NIGHT BEFORE SURGERY, take  1/2 of normal insulin dose       The day of surgery, do not take other diabetes injectables, including Byetta (exenatide), Bydureon (exenatide ER), Victoza (liraglutide), or Trulicity (dulaglutide).  If your CBG is greater than 220 mg/dL, you may take  of your sliding scale  (correction) dose of insulin.    For patients with insulin pumps: Contact your diabetes doctor for specific instructions before surgery. Decrease basal rates by 20% at midnight the night before your surgery. Note that if your surgery is planned to be longer than 2 hours, your insulin pump will be removed and intravenous (IV) insulin will be started and managed by the nurses and the anesthesiologist. You will be able to restart your insulin pump once you are awake and able to manage it.  Make sure to bring insulin pump supplies to the hospital with you in case the  site needs to be changed.  Patient Signature:  Date:   Nurse Signature:  Date:   Reviewed and Endorsed by [facility] Patient Education Committee, August 2015

## 2021-02-28 ENCOUNTER — Other Ambulatory Visit: Payer: Self-pay | Admitting: Internal Medicine

## 2021-02-28 DIAGNOSIS — E1121 Type 2 diabetes mellitus with diabetic nephropathy: Secondary | ICD-10-CM

## 2021-03-01 ENCOUNTER — Encounter (HOSPITAL_COMMUNITY): Payer: Self-pay

## 2021-03-01 ENCOUNTER — Other Ambulatory Visit (HOSPITAL_COMMUNITY): Payer: Medicare Other

## 2021-03-01 ENCOUNTER — Encounter (HOSPITAL_COMMUNITY)
Admission: RE | Admit: 2021-03-01 | Discharge: 2021-03-01 | Disposition: A | Discharge status: Home / Self Care | Payer: Medicare Other | Source: Ambulatory Visit | Attending: Surgery | Admitting: Surgery

## 2021-03-01 ENCOUNTER — Other Ambulatory Visit: Payer: Self-pay

## 2021-03-01 DIAGNOSIS — Z01818 Encounter for other preprocedural examination: Secondary | ICD-10-CM | POA: Diagnosis not present

## 2021-03-01 DIAGNOSIS — E213 Hyperparathyroidism, unspecified: Secondary | ICD-10-CM

## 2021-03-01 LAB — BASIC METABOLIC PANEL
Anion gap: 6 (ref 5–15)
BUN: 21 mg/dL (ref 8–23)
CO2: 25 mmol/L (ref 22–32)
Calcium: 10.5 mg/dL — ABNORMAL HIGH (ref 8.9–10.3)
Chloride: 110 mmol/L (ref 98–111)
Creatinine, Ser: 1.29 mg/dL — ABNORMAL HIGH (ref 0.44–1.00)
GFR, Estimated: 43 mL/min — ABNORMAL LOW (ref >60)
Glucose, Bld: 151 mg/dL — ABNORMAL HIGH (ref 70–99)
Potassium: 3.7 mmol/L (ref 3.5–5.1)
Sodium: 141 mmol/L (ref 135–145)

## 2021-03-01 LAB — HEMOGLOBIN A1C
Hgb A1c MFr Bld: 6.4 % — ABNORMAL HIGH (ref 4.8–5.6)
Mean Plasma Glucose: 136.98 mg/dL

## 2021-03-01 LAB — CBC
HCT: 31.5 % — ABNORMAL LOW (ref 36.0–46.0)
Hemoglobin: 9.8 g/dL — ABNORMAL LOW (ref 12.0–15.0)
MCH: 27.9 pg (ref 26.0–34.0)
MCHC: 31.1 g/dL (ref 30.0–36.0)
MCV: 89.7 fL (ref 80.0–100.0)
Platelets: 203 10*3/uL (ref 150–400)
RBC: 3.51 MIL/uL — ABNORMAL LOW (ref 3.87–5.11)
RDW: 15.4 % (ref 11.5–15.5)
WBC: 4.6 10*3/uL (ref 4.0–10.5)
nRBC: 0 % (ref 0.0–0.2)

## 2021-03-01 LAB — GLUCOSE, CAPILLARY: Glucose-Capillary: 148 mg/dL — ABNORMAL HIGH (ref 70–99)

## 2021-03-01 NOTE — Progress Notes (Signed)
PCP - Harlan Stains [facility]  EKG - preformed during PAT visit 03/01/2021  Patient states checks cbgs throughout the day  Fasting blood sugars range between 139-159 Blood sugar PAT visit 03/01/2021 148   Activity level:  Can go up a flight of stairs and activities of daily living without stopping and without symptoms     Anesthesia review:   Patient denies shortness of breath, fever, cough and chest pain at PAT appointment   Patient verbalized understanding of instructions that were given to them at the PAT appointment. Patient was also instructed that they will need to review over the PAT instructions again at home before surgery.

## 2021-03-01 NOTE — Progress Notes (Addendum)
Routed cbc report per PAT visit 03/01/2021 to Dr Harlow Asa

## 2021-03-02 ENCOUNTER — Other Ambulatory Visit (HOSPITAL_COMMUNITY)
Admission: RE | Admit: 2021-03-02 | Discharge: 2021-03-02 | Disposition: A | Discharge status: Home / Self Care | Payer: Medicare Other | Source: Ambulatory Visit | Attending: Surgery | Admitting: Surgery

## 2021-03-02 DIAGNOSIS — Z01812 Encounter for preprocedural laboratory examination: Secondary | ICD-10-CM | POA: Insufficient documentation

## 2021-03-02 DIAGNOSIS — Z20822 Contact with and (suspected) exposure to covid-19: Secondary | ICD-10-CM | POA: Diagnosis not present

## 2021-03-02 LAB — SARS CORONAVIRUS 2 (TAT 6-24 HRS): SARS Coronavirus 2: NEGATIVE

## 2021-03-05 ENCOUNTER — Encounter (HOSPITAL_COMMUNITY): Payer: Self-pay | Admitting: Surgery

## 2021-03-05 DIAGNOSIS — E042 Nontoxic multinodular goiter: Secondary | ICD-10-CM | POA: Diagnosis present

## 2021-03-05 NOTE — H&P (Signed)
General Surgery [facility]  [name] Le DOB: [birth year] Married / Language: English / Race: Black or African American Female   History of Present Illness   The patient is a 75 year old female who presents with a complaint of Enlarged thyroid.  CHIEF COMPLAINT: multinodular thyroid goiter, hyperparathyroidism  Patient is referred by Dr. Harlan Stains for surgical evaluation and management of a multinodular thyroid goiter with developing compressive symptoms as well as hypercalcemia and suspected primary hyperparathyroidism.  Ultrasound demonstrated multiple bilateral thyroid nodules.  There is a nodule in the right lower lobe measuring 3.1 cm which was found to be suspicious by ultrasound criteria.  Patient is scheduled to undergo a fine-needle aspiration biopsy later today.  Patient also underwent nuclear medicine parathyroid scan performed on Feb 02, 2021.  This localized a left superior parathyroid adenoma.  Today we discussed proceeding with total thyroidectomy.  Patient is scheduled to have the fine-needle aspiration biopsy performed later today.  If that is benign, then she would not require any type of lymph node dissection.  We would also plan on performing concurrent left superior parathyroidectomy.   Problem List/Past Medical  MULTIPLE THYROID NODULES (E04.2)   ENLARGED THYROID (E04.9)   PRIMARY HYPERPARATHYROIDISM (E21.0)    Past Surgical History  Carotid Artery Surgery   Bilateral. Cataract Surgery   Bilateral. Cesarean Section - Multiple   Colon Polyp Removal - Colonoscopy   Colon Polyp Removal - Open   Colon Removal - Complete   Colon Removal - Partial   Coronary Artery Bypass Graft   Foot Surgery   Left. Gallbladder Surgery - Open   Hysterectomy (due to cancer) - Complete   Hysterectomy (due to cancer) - Partial   Hysterectomy (not due to cancer) - Partial   Knee Surgery   Bilateral. Laparoscopic Inguinal Hernia Surgery   Bilateral.  multiple Lung Surgery   Left. Mammoplasty; Reduction   Bilateral. Nephrectomy   Left. Valve Replacement   Ventral / Umbilical Hernia Surgery   multiple  Diagnostic Studies History Colonoscopy   never Mammogram   never Pap Smear   >5 years ago  Allergies  No Known Drug Allergies  Allergies Reconciled    Medication History  Metoprolol Tartrate  (5MG /5ML Solution, Intravenous) Active. Olmesartan Medoxomil  (5MG  Tablet, Oral) Active. Pravastatin Sodium  (40MG  Tablet, Oral) Active. Bayer Advanced Aspirin Ex St  (500MG  Tablet, Oral) Active. hydrALAZINE HCl  (10MG  Tablet, Oral) Active. Albuterol Sulfate HFA  (108 (90 Base)MCG/ACT Aerosol Soln, Inhalation) Active. Vitamin D3  (10MCG/ML Liquid, Oral) Active. Vitamin B-12  (25MCG Tablet, Oral) Active. Vitamin C  (100MG  Tablet Chewable, Oral) Active. Zinc  (10MG  Tablet, Oral) Active. Black Elderberry(Berry-Flower)  (575MG  Capsule, Oral) Active. Multivitamin  (Oral) Active. Medications Reconciled  Hemocyte Plus  (106-1MG  Capsule, Oral) Active. NovoLOG Mix 70/30 FlexPen  ((70-30) 100UNIT/ML Susp Pen-inj, Subcutaneous) Active.  Social History  Alcohol use   Heavy alcohol use. Caffeine use   Coffee, Tea. Illicit drug use   Prefer to discuss with provider. Tobacco use   Current every day smoker.  Family History  Alcohol Abuse   Brother. Cerebrovascular Accident   Mother. Colon Cancer   Father. Family history unknown   First Degree Relatives    Pregnancy / Birth History  Age at menarche   74 years. Age of menopause   <44 Contraceptive History   Contraceptive implant, Depo-provera, Intrauterine device, Oral contraceptives. Gravida   10 Irregular periods   Length (months) of breastfeeding   >24  Maternal age   >63 Para   0  Other Problems  Alcohol Abuse   Anxiety Disorder   Arthritis   Asthma   Atrial Fibrillation   Breast Cancer   Cancer   Cerebrovascular Accident   Chest pain   Cholelithiasis   Chronic Renal Failure  Syndrome   Colon Cancer   Congestive Heart Failure   Depression   Diverticulosis   Emphysema Of Lung   Gastric Ulcer   Gastroesophageal Reflux Disease   General anesthesia - complications   Heart murmur   Hemorrhoids   Hepatitis   High blood pressure   HIV-positive   Home Oxygen Use   Hypercholesterolemia   Inguinal Hernia   Lump In Breast   Lung Cancer   Melanoma   Migraine Headache   Myocardial infarction   Oophorectomy   Bilateral. Ovarian Cancer   Pancreatic Cancer   Pulmonary Embolism / Blood Clot in Legs   Rectal Cancer   Seizure Disorder   Sickle cell disease   Sleep Apnea   Thyroid Cancer   Thyroid Disease   Ulcerative Colitis   Umbilical Hernia Repair   Vascular Disease   Ventral Hernia Repair      Physical Exam  The physical exam findings are as follows: Note:  Limited examination  Thyroid is relatively symmetrically enlarged. There are bilateral nodules measuring up to 3 cm in size to palpation. There is no tenderness. There is no palpable lymphadenopathy. Voice quality is normal.    Assessment & Plan   MULTIPLE THYROID NODULES (E04.2) ENLARGED THYROID (E04.9) PRIMARY HYPERPARATHYROIDISM (E21.0)  Patient returns today to discuss the results of her nuclear medicine parathyroid scan and her thyroid ultrasound.  She is scheduled for fine-needle aspiration biopsy of the thyroid nodule later this afternoon.   Patient provided with a copy of "Parathyroid Surgery: Treatment for Your Parathyroid Gland Problem", published by [employer], 12 pages.  Book reviewed and explained to patient during visit today.  The patient and I discussed proceeding with total thyroidectomy for management of her multinodular thyroid goiter.  This would relieve her compressive symptoms which are becoming more significant.  We also discussed performing concurrent left superior parathyroidectomy at the time of surgery for management of her primary hyperparathyroidism.  We discussed the  risk and benefits of surgery including the risk of recurrent laryngeal nerve injury and injury to parathyroid glands.  We discussed the size and location of the surgical incision.  We discussed the hospital stay to be anticipated.  We discussed the need for lifelong thyroid hormone replacement.  Fine-needle aspiration biopsy results will determine whether or not any type of lymph node dissection is required at the time of surgery.  The patient understands and wishes to proceed.  The risks and benefits of the procedure have been discussed at length with the patient.  The patient understands the proposed procedure, potential alternative treatments, and the course of recovery to be expected.  All of the patient's questions have been answered at this time.  The patient wishes to proceed with surgery.  Armandina Gemma, MD [facility] Office: [phone]

## 2021-03-06 ENCOUNTER — Ambulatory Visit (HOSPITAL_COMMUNITY): Payer: Medicare Other | Admitting: Registered Nurse

## 2021-03-06 ENCOUNTER — Encounter (HOSPITAL_COMMUNITY): Payer: Self-pay | Admitting: Surgery

## 2021-03-06 ENCOUNTER — Ambulatory Visit (HOSPITAL_COMMUNITY)
Admission: RE | Admit: 2021-03-06 | Discharge: 2021-03-07 | Disposition: A | Discharge status: Home / Self Care | Payer: Medicare Other | Attending: Surgery | Admitting: Surgery

## 2021-03-06 ENCOUNTER — Ambulatory Visit (HOSPITAL_COMMUNITY): Payer: Medicare Other | Admitting: Physician Assistant

## 2021-03-06 ENCOUNTER — Encounter (HOSPITAL_COMMUNITY)
Admission: RE | Disposition: A | Discharge status: Home / Self Care | Payer: Self-pay | Source: Home / Self Care | Attending: Surgery

## 2021-03-06 DIAGNOSIS — E042 Nontoxic multinodular goiter: Secondary | ICD-10-CM | POA: Diagnosis present

## 2021-03-06 DIAGNOSIS — Z952 Presence of prosthetic heart valve: Secondary | ICD-10-CM | POA: Diagnosis not present

## 2021-03-06 DIAGNOSIS — E210 Primary hyperparathyroidism: Secondary | ICD-10-CM | POA: Diagnosis present

## 2021-03-06 DIAGNOSIS — Z86711 Personal history of pulmonary embolism: Secondary | ICD-10-CM | POA: Diagnosis not present

## 2021-03-06 DIAGNOSIS — Z85038 Personal history of other malignant neoplasm of large intestine: Secondary | ICD-10-CM | POA: Diagnosis not present

## 2021-03-06 DIAGNOSIS — D571 Sickle-cell disease without crisis: Secondary | ICD-10-CM | POA: Insufficient documentation

## 2021-03-06 DIAGNOSIS — Z951 Presence of aortocoronary bypass graft: Secondary | ICD-10-CM | POA: Diagnosis not present

## 2021-03-06 DIAGNOSIS — E213 Hyperparathyroidism, unspecified: Secondary | ICD-10-CM | POA: Diagnosis present

## 2021-03-06 DIAGNOSIS — Z7984 Long term (current) use of oral hypoglycemic drugs: Secondary | ICD-10-CM | POA: Insufficient documentation

## 2021-03-06 DIAGNOSIS — Z7951 Long term (current) use of inhaled steroids: Secondary | ICD-10-CM | POA: Diagnosis not present

## 2021-03-06 DIAGNOSIS — I509 Heart failure, unspecified: Secondary | ICD-10-CM | POA: Insufficient documentation

## 2021-03-06 DIAGNOSIS — Z794 Long term (current) use of insulin: Secondary | ICD-10-CM | POA: Diagnosis not present

## 2021-03-06 DIAGNOSIS — Z853 Personal history of malignant neoplasm of breast: Secondary | ICD-10-CM | POA: Insufficient documentation

## 2021-03-06 DIAGNOSIS — I130 Hypertensive heart and chronic kidney disease with heart failure and stage 1 through stage 4 chronic kidney disease, or unspecified chronic kidney disease: Secondary | ICD-10-CM | POA: Insufficient documentation

## 2021-03-06 DIAGNOSIS — Z7982 Long term (current) use of aspirin: Secondary | ICD-10-CM | POA: Diagnosis not present

## 2021-03-06 DIAGNOSIS — C73 Malignant neoplasm of thyroid gland: Secondary | ICD-10-CM | POA: Insufficient documentation

## 2021-03-06 DIAGNOSIS — E049 Nontoxic goiter, unspecified: Secondary | ICD-10-CM | POA: Diagnosis present

## 2021-03-06 DIAGNOSIS — E1122 Type 2 diabetes mellitus with diabetic chronic kidney disease: Secondary | ICD-10-CM | POA: Insufficient documentation

## 2021-03-06 DIAGNOSIS — Z79899 Other long term (current) drug therapy: Secondary | ICD-10-CM | POA: Insufficient documentation

## 2021-03-06 DIAGNOSIS — Z8543 Personal history of malignant neoplasm of ovary: Secondary | ICD-10-CM | POA: Diagnosis not present

## 2021-03-06 DIAGNOSIS — N189 Chronic kidney disease, unspecified: Secondary | ICD-10-CM | POA: Diagnosis not present

## 2021-03-06 HISTORY — PX: PARATHYROIDECTOMY: SHX19

## 2021-03-06 HISTORY — PX: THYROIDECTOMY: SHX17

## 2021-03-06 LAB — GLUCOSE, CAPILLARY
Glucose-Capillary: 101 mg/dL — ABNORMAL HIGH (ref 70–99)
Glucose-Capillary: 236 mg/dL — ABNORMAL HIGH (ref 70–99)
Glucose-Capillary: 182 mg/dL — ABNORMAL HIGH (ref 70–99)
Glucose-Capillary: 171 mg/dL — ABNORMAL HIGH (ref 70–99)

## 2021-03-06 SURGERY — THYROIDECTOMY
Anesthesia: General | Site: Neck

## 2021-03-06 MED ORDER — FENTANYL CITRATE (PF) 100 MCG/2ML IJ SOLN
INTRAMUSCULAR | Status: DC | PRN
  Administered 2021-03-06: 50 ug via INTRAVENOUS
  Administered 2021-03-06 (×2): 25 ug via INTRAVENOUS
  Administered 2021-03-06 (×2): 50 ug via INTRAVENOUS

## 2021-03-06 MED ORDER — METOPROLOL TARTRATE 50 MG PO TABS
100.0000 mg | ORAL_TABLET | Freq: Two times a day (BID) | ORAL | Status: DC
  Administered 2021-03-06 – 2021-03-07 (×2): 100 mg via ORAL
  Filled 2021-03-06 (×3): qty 2

## 2021-03-06 MED ORDER — NALOXONE HCL 0.4 MG/ML IJ SOLN
INTRAMUSCULAR | Status: DC | PRN
  Administered 2021-03-06 (×2): 40 ug via INTRAVENOUS

## 2021-03-06 MED ORDER — ACETAMINOPHEN 325 MG PO TABS
650.0000 mg | ORAL_TABLET | Freq: Four times a day (QID) | ORAL | Status: DC | PRN
  Administered 2021-03-06: 650 mg via ORAL
  Filled 2021-03-06: qty 2

## 2021-03-06 MED ORDER — CEFAZOLIN SODIUM-DEXTROSE 2-4 GM/100ML-% IV SOLN
2.0000 g | INTRAVENOUS | Status: AC
  Administered 2021-03-06: 2 g via INTRAVENOUS
  Filled 2021-03-06: qty 100

## 2021-03-06 MED ORDER — OXYCODONE HCL 5 MG PO TABS
5.0000 mg | ORAL_TABLET | ORAL | Status: DC | PRN
  Administered 2021-03-06 – 2021-03-07 (×2): 10 mg via ORAL
  Filled 2021-03-06 (×2): qty 2

## 2021-03-06 MED ORDER — HEMOSTATIC AGENTS (NO CHARGE) OPTIME
TOPICAL | Status: DC | PRN
  Administered 2021-03-06: 1 via TOPICAL

## 2021-03-06 MED ORDER — PROPOFOL 10 MG/ML IV BOLUS
INTRAVENOUS | Status: DC | PRN
  Administered 2021-03-06: 150 mg via INTRAVENOUS

## 2021-03-06 MED ORDER — ONDANSETRON HCL 4 MG/2ML IJ SOLN: 4.0000 mg | Freq: Four times a day (QID) | INTRAMUSCULAR | Status: DC | PRN

## 2021-03-06 MED ORDER — LATANOPROST 0.005 % OP SOLN
1.0000 [drp] | Freq: Every day | OPHTHALMIC | Status: DC
  Administered 2021-03-06: 1 [drp] via OPHTHALMIC
  Filled 2021-03-06: qty 2.5

## 2021-03-06 MED ORDER — CHLORHEXIDINE GLUCONATE CLOTH 2 % EX PADS: 6.0000 | MEDICATED_PAD | Freq: Once | CUTANEOUS | Status: DC

## 2021-03-06 MED ORDER — FENTANYL CITRATE (PF) 100 MCG/2ML IJ SOLN
INTRAMUSCULAR | Status: AC
  Filled 2021-03-06: qty 2

## 2021-03-06 MED ORDER — ONDANSETRON HCL 4 MG/2ML IJ SOLN
INTRAMUSCULAR | Status: AC
  Filled 2021-03-06: qty 2

## 2021-03-06 MED ORDER — LIDOCAINE 2% (20 MG/ML) 5 ML SYRINGE
INTRAMUSCULAR | Status: AC
  Filled 2021-03-06: qty 5

## 2021-03-06 MED ORDER — ALBUTEROL SULFATE (2.5 MG/3ML) 0.083% IN NEBU: 2.5000 mg | INHALATION_SOLUTION | Freq: Four times a day (QID) | RESPIRATORY_TRACT | Status: DC | PRN

## 2021-03-06 MED ORDER — OXYCODONE HCL 5 MG PO TABS
ORAL_TABLET | ORAL | Status: AC
  Administered 2021-03-06: 5 mg
  Filled 2021-03-06: qty 1

## 2021-03-06 MED ORDER — PROPOFOL 10 MG/ML IV BOLUS
INTRAVENOUS | Status: AC
  Filled 2021-03-06: qty 20

## 2021-03-06 MED ORDER — HYDROMORPHONE HCL 1 MG/ML IJ SOLN: 1.0000 mg | INTRAMUSCULAR | Status: DC | PRN

## 2021-03-06 MED ORDER — SODIUM CHLORIDE 0.45 % IV SOLN: INTRAVENOUS | Status: DC

## 2021-03-06 MED ORDER — LACTATED RINGERS IV SOLN: INTRAVENOUS | Status: DC | PRN

## 2021-03-06 MED ORDER — CHLORHEXIDINE GLUCONATE 0.12 % MT SOLN
15.0000 mL | Freq: Once | OROMUCOSAL | Status: AC
  Administered 2021-03-06: 15 mL via OROMUCOSAL

## 2021-03-06 MED ORDER — LIDOCAINE HCL (CARDIAC) PF 100 MG/5ML IV SOSY
PREFILLED_SYRINGE | INTRAVENOUS | Status: DC | PRN
  Administered 2021-03-06: 60 mg via INTRAVENOUS

## 2021-03-06 MED ORDER — IRBESARTAN 150 MG PO TABS
300.0000 mg | ORAL_TABLET | Freq: Every day | ORAL | Status: DC
  Administered 2021-03-06 – 2021-03-07 (×2): 300 mg via ORAL
  Filled 2021-03-06 (×2): qty 2

## 2021-03-06 MED ORDER — DEXAMETHASONE SODIUM PHOSPHATE 10 MG/ML IJ SOLN
INTRAMUSCULAR | Status: DC | PRN
  Administered 2021-03-06: 5 mg via INTRAVENOUS

## 2021-03-06 MED ORDER — ONDANSETRON 4 MG PO TBDP: 4.0000 mg | ORAL_TABLET | Freq: Four times a day (QID) | ORAL | Status: DC | PRN

## 2021-03-06 MED ORDER — CALCIUM CARBONATE 1250 (500 CA) MG PO TABS
2.0000 | ORAL_TABLET | Freq: Three times a day (TID) | ORAL | Status: DC
  Administered 2021-03-06 – 2021-03-07 (×3): 1000 mg via ORAL
  Filled 2021-03-06 (×3): qty 1

## 2021-03-06 MED ORDER — HYDRALAZINE HCL 50 MG PO TABS
100.0000 mg | ORAL_TABLET | Freq: Three times a day (TID) | ORAL | Status: DC
  Administered 2021-03-06 – 2021-03-07 (×3): 100 mg via ORAL
  Filled 2021-03-06 (×3): qty 2

## 2021-03-06 MED ORDER — ONDANSETRON HCL 4 MG/2ML IJ SOLN
INTRAMUSCULAR | Status: DC | PRN
  Administered 2021-03-06: 4 mg via INTRAVENOUS

## 2021-03-06 MED ORDER — INSULIN ASPART 100 UNIT/ML IJ SOLN
0.0000 [IU] | Freq: Three times a day (TID) | INTRAMUSCULAR | Status: DC
  Administered 2021-03-06: 3 [IU] via SUBCUTANEOUS
  Administered 2021-03-07: 2 [IU] via SUBCUTANEOUS

## 2021-03-06 MED ORDER — 0.9 % SODIUM CHLORIDE (POUR BTL) OPTIME
TOPICAL | Status: DC | PRN
  Administered 2021-03-06: 1000 mL

## 2021-03-06 MED ORDER — TRAMADOL HCL 50 MG PO TABS
50.0000 mg | ORAL_TABLET | Freq: Four times a day (QID) | ORAL | Status: DC | PRN
  Administered 2021-03-06 (×2): 50 mg via ORAL
  Filled 2021-03-06 (×2): qty 1

## 2021-03-06 MED ORDER — LACTATED RINGERS IV SOLN: INTRAVENOUS | Status: DC

## 2021-03-06 MED ORDER — ORAL CARE MOUTH RINSE: 15.0000 mL | Freq: Once | OROMUCOSAL | Status: AC

## 2021-03-06 MED ORDER — DEXAMETHASONE SODIUM PHOSPHATE 10 MG/ML IJ SOLN
INTRAMUSCULAR | Status: AC
  Filled 2021-03-06: qty 1

## 2021-03-06 MED ORDER — ACETAMINOPHEN 650 MG RE SUPP
650.0000 mg | Freq: Four times a day (QID) | RECTAL | Status: DC | PRN
Start: 2021-03-06 — End: 2021-03-07

## 2021-03-06 MED ORDER — HYDRALAZINE HCL 20 MG/ML IJ SOLN: 5.0000 mg | INTRAMUSCULAR | Status: DC | PRN

## 2021-03-06 MED ORDER — EPHEDRINE SULFATE 50 MG/ML IJ SOLN
INTRAMUSCULAR | Status: DC | PRN
  Administered 2021-03-06: 5 mg via INTRAVENOUS

## 2021-03-06 MED ORDER — NALOXONE HCL 0.4 MG/ML IJ SOLN
INTRAMUSCULAR | Status: AC
  Filled 2021-03-06: qty 1

## 2021-03-06 MED ORDER — LABETALOL HCL 5 MG/ML IV SOLN
INTRAVENOUS | Status: DC | PRN
  Administered 2021-03-06: 5 mg via INTRAVENOUS

## 2021-03-06 MED ORDER — LABETALOL HCL 5 MG/ML IV SOLN
INTRAVENOUS | Status: AC
  Filled 2021-03-06: qty 4

## 2021-03-06 MED ORDER — ROCURONIUM BROMIDE 100 MG/10ML IV SOLN
INTRAVENOUS | Status: DC | PRN
  Administered 2021-03-06: 70 mg via INTRAVENOUS

## 2021-03-06 SURGICAL SUPPLY — 37 items
ADH SKN CLS APL DERMABOND .7 (GAUZE/BANDAGES/DRESSINGS) ×2
APL PRP STRL LF DISP 70% ISPRP (MISCELLANEOUS) ×2
ATTRACTOMAT 16X20 MAGNETIC DRP (DRAPES) ×4 IMPLANT
BAG COUNTER SPONGE SURGICOUNT (BAG) IMPLANT
BAG SPNG CNTER NS LX DISP (BAG)
BAG SURGICOUNT SPONGE COUNTING (BAG)
BLADE SURG 15 STRL LF DISP TIS (BLADE) ×2 IMPLANT
BLADE SURG 15 STRL SS (BLADE) ×4
CHLORAPREP W/TINT 26 (MISCELLANEOUS) ×4 IMPLANT
CLIP VESOCCLUDE MED 6/CT (CLIP) ×20 IMPLANT
CLIP VESOCCLUDE SM WIDE 6/CT (CLIP) ×12 IMPLANT
COVER SURGICAL LIGHT HANDLE (MISCELLANEOUS) ×4 IMPLANT
DERMABOND ADVANCED (GAUZE/BANDAGES/DRESSINGS) ×2
DERMABOND ADVANCED .7 DNX12 (GAUZE/BANDAGES/DRESSINGS) ×2 IMPLANT
DRAPE LAPAROTOMY T 98X78 PEDS (DRAPES) ×4 IMPLANT
DRAPE UTILITY XL STRL (DRAPES) ×4 IMPLANT
ELECT PENCIL ROCKER SW 15FT (MISCELLANEOUS) ×4 IMPLANT
ELECT REM PT RETURN 15FT ADLT (MISCELLANEOUS) ×4 IMPLANT
GAUZE 4X4 16PLY ~~LOC~~+RFID DBL (SPONGE) ×8 IMPLANT
GLOVE SURG ORTHO LTX SZ8 (GLOVE) ×4 IMPLANT
GOWN STRL REUS W/TWL XL LVL3 (GOWN DISPOSABLE) ×12 IMPLANT
HEMOSTAT SURGICEL 2X4 FIBR (HEMOSTASIS) ×4 IMPLANT
ILLUMINATOR WAVEGUIDE N/F (MISCELLANEOUS) ×4 IMPLANT
KIT BASIN OR (CUSTOM PROCEDURE TRAY) ×4 IMPLANT
KIT TURNOVER KIT A (KITS) ×4 IMPLANT
NEEDLE HYPO 25X1 1.5 SAFETY (NEEDLE) ×4 IMPLANT
PACK BASIC VI WITH GOWN DISP (CUSTOM PROCEDURE TRAY) ×4 IMPLANT
PENCIL SMOKE EVACUATOR (MISCELLANEOUS) IMPLANT
SHEARS HARMONIC 9CM CVD (BLADE) ×4 IMPLANT
SUT MNCRL AB 4-0 PS2 18 (SUTURE) ×4 IMPLANT
SUT VIC AB 3-0 SH 18 (SUTURE) ×8 IMPLANT
SYR BULB IRRIG 60ML STRL (SYRINGE) ×4 IMPLANT
SYR CONTROL 10ML LL (SYRINGE) ×4 IMPLANT
TOWEL OR 17X26 10 PK STRL BLUE (TOWEL DISPOSABLE) ×4 IMPLANT
TOWEL OR NON WOVEN STRL DISP B (DISPOSABLE) ×4 IMPLANT
TUBING CONNECTING 10 (TUBING) ×3 IMPLANT
TUBING CONNECTING 10' (TUBING) ×1

## 2021-03-06 NOTE — Interval H&P Note (Signed)
History and Physical Interval Note:  03/06/2021 8:26 AM  [name] Le  has presented today for surgery, with the diagnosis of MULTINODULAR THYROID GOITER, PRIMARY HYPERPARATHYROIDISM.  The various methods of treatment have been discussed with the patient and family. After consideration of risks, benefits and other options for treatment, the patient has consented to    Procedure(s): TOTAL THYROIDECTOMY (N/A) LEFT SUPERIOR PARATHYROIDECTOMY (Left) as a surgical intervention.    The patient's history has been reviewed, patient examined, no change in status, stable for surgery.  I have reviewed the patient's chart and labs.  Questions were answered to the patient's satisfaction.    Armandina Gemma, MD [facility] Office: [place]

## 2021-03-06 NOTE — Anesthesia Preprocedure Evaluation (Signed)
Anesthesia Evaluation  Patient identified by MRN, date of birth, ID band Patient awake    Reviewed: Allergy & Precautions, NPO status , Patient's Chart, lab work & pertinent test results, reviewed documented beta blocker date and time   Airway Mallampati: III  TM Distance: >3 FB Neck ROM: Full    Dental no notable dental hx. (+) Missing, Poor Dentition, Dental Advisory Given, Chipped,    Pulmonary    Pulmonary exam normal breath sounds clear to auscultation       Cardiovascular hypertension, Pt. on medications and Pt. on home beta blockers Normal cardiovascular exam Rhythm:Regular Rate:Normal     Neuro/Psych negative neurological ROS  negative psych ROS   GI/Hepatic negative GI ROS, Neg liver ROS,   Endo/Other  diabetes, Well Controlled, Type 2, Oral Hypoglycemic Agents, Insulin DependentMultinodular goiter, primary hyperparathyroidism  FS in preop 101 Did not take insulin last night   Renal/GU Renal InsufficiencyRenal diseaseCr 1.29  negative genitourinary   Musculoskeletal negative musculoskeletal ROS (+)   Abdominal (+) + obese,   Peds negative pediatric ROS (+)  Hematology  (+) Blood dyscrasia, ,   Anesthesia Other Findings   Reproductive/Obstetrics negative OB ROS                             Anesthesia Physical Anesthesia Plan  ASA: 3  Anesthesia Plan: General   Post-op Pain Management:    Induction: Intravenous  PONV Risk Score and Plan: Ondansetron, Dexamethasone, Midazolam and Treatment may vary due to age or medical condition  Airway Management Planned: Oral ETT  Additional Equipment: None  Intra-op Plan:   Post-operative Plan: Extubation in OR  Informed Consent: I have reviewed the patients History and Physical, chart, labs and discussed the procedure including the risks, benefits and alternatives for the proposed anesthesia with the patient or authorized  representative who has indicated his/her understanding and acceptance.     Dental advisory given  Plan Discussed with: CRNA  Anesthesia Plan Comments:         Anesthesia Quick Evaluation

## 2021-03-06 NOTE — Anesthesia Procedure Notes (Signed)
Procedure Name: Intubation Date/Time: 03/06/2021 8:58 AM Performed by: Victoriano Lain, CRNA Pre-anesthesia Checklist: Emergency Drugs available, Patient identified, Suction available, Patient being monitored and Timeout performed Patient Re-evaluated:Patient Re-evaluated prior to induction Oxygen Delivery Method: Circle system utilized Preoxygenation: Pre-oxygenation with 100% oxygen Induction Type: IV induction Ventilation: Two handed mask ventilation required, Oral airway inserted - appropriate to patient size and Mask ventilation with difficulty Laryngoscope Size: Glidescope and 4 Grade View: Grade I Tube size: 7.5 mm Number of attempts: 1 Airway Equipment and Method: Rigid stylet and Oral airway Placement Confirmation: ETT inserted through vocal cords under direct vision, breath sounds checked- equal and bilateral and positive ETCO2 Secured at: 23 cm Tube secured with: Tape Dental Injury: Teeth and Oropharynx as per pre-operative assessment  Difficulty Due To: Difficulty was anticipated, Difficult Airway- due to large tongue and Difficult Airway- due to limited oral opening Future Recommendations: Recommend- induction with short-acting agent, and alternative techniques readily available Comments: D/t goiter and limited OA, glidescope was utilized. DL x1 Charyl Bigger, [place]

## 2021-03-06 NOTE — Transfer of Care (Signed)
Immediate Anesthesia Transfer of Care Note  Patient: [name] Le  Procedure(s) Performed: TOTAL THYROIDECTOMY (Neck) LEFT SUPERIOR PARATHYROIDECTOMY (Left: Neck)  Patient Location: PACU  Anesthesia Type:General  Level of Consciousness: drowsy  Airway & Oxygen Therapy: Patient connected to face mask oxygen  Post-op Assessment: Report given to RN  Post vital signs: Reviewed  Last Vitals:  Vitals Value Taken Time  BP 254/107 03/06/21 1112  Temp    Pulse 80 03/06/21 1115  Resp    SpO2 96 % 03/06/21 1115  Vitals shown include unvalidated device data.  Last Pain:  Vitals:   03/06/21 0806  TempSrc:   PainSc: 0-No pain         Complications: No notable events documented.

## 2021-03-06 NOTE — Op Note (Signed)
Procedure Note  Pre-operative Diagnosis:  Primary hyperparathyroidism, non-toxic multinodular thyroid goiter  Post-operative Diagnosis:  same  Surgeon:  Armandina Gemma, MD  Assistant:  Carlena Hurl, PA-C   Procedure:  Total thyroidectomy, left superior parathyroidectomy  Anesthesia:  General  Estimated Blood Loss:  minimal  Drains: none         Specimen: thyroid to pathology  Indications:  Patient is referred by Dr. Harlan Stains for surgical evaluation and management of a multinodular thyroid goiter with developing compressive symptoms as well as hypercalcemia and suspected primary hyperparathyroidism.  Ultrasound demonstrated multiple bilateral thyroid nodules.  There is a nodule in the right lower lobe measuring 3.1 cm which was found to be suspicious by ultrasound criteria.  FNA was benign.  Patient also underwent nuclear medicine parathyroid scan performed on Feb 02, 2021.  This localized a left superior parathyroid adenoma. Today we discussed proceeding with total thyroidectomy.  We would also plan on performing concurrent left superior parathyroidectomy.  Procedure Details: Procedure was done in OR #5 at the [facility]. The patient was brought to the operating room and placed in a supine position on the operating room table. Following administration of general anesthesia, the patient was positioned and then prepped and draped in the usual aseptic fashion. After ascertaining that an adequate level of anesthesia had been achieved, a small Kocher incision was made with #15 blade. Dissection was carried through subcutaneous tissues and platysma.Hemostasis was achieved with the electrocautery. Skin flaps were elevated cephalad and caudad from the thyroid notch to the sternal notch. A Mahorner self-retaining retractor was placed for exposure. Strap muscles were incised in the midline and dissection was begun on the left side.  Strap muscles were reflected laterally.  Left thyroid lobe was  markedly enlarged and multinodular, displacing the airway to the right.  The left lobe was gently mobilized with blunt dissection. Superior pole vessels were dissected out and divided individually between small and medium ligaclips with the harmonic scalpel. The thyroid lobe was rolled anteriorly. Branches of the inferior thyroid artery were divided between small ligaclips with the harmonic scalpel. Inferior venous tributaries were divided between ligaclips. The superior parathyroid gland was identified at the superior pole of the thryoid.  It was significantly enlarged.  It was dissected out on its vascular pedicle and vessels were divided between ligaclips.  The gland was excised and submitted for frozen section which confirmed parathyroid tissue with significant fibrous bands throughout the tissue. The recurrent laryngeal nerve was identified and preserved along its course. The ligament of Gwenlyn Found was released with the electrocautery and the gland was mobilized onto the anterior trachea. Isthmus was mobilized across the midline. There was no significant pyramidal lobe present. Dry pack was placed in the left neck.  The right thyroid lobe was gently mobilized with blunt dissection. Right thyroid lobe was moderately enlarged and nodular. Superior pole vessels were dissected out and divided between small and medium ligaclips with the Harmonic scalpel. Superior parathyroid was identified and preserved. Inferior venous tributaries were divided between medium ligaclips with the harmonic scalpel. The right thyroid lobe was rolled anteriorly and the branches of the inferior thyroid artery divided between small ligaclips. The right recurrent laryngeal nerve was identified and preserved along its course. The ligament of Gwenlyn Found was released with the electrocautery. The right thyroid lobe was mobilized onto the anterior trachea and the remainder of the thyroid was dissected off the anterior trachea and the thyroid was  completely excised. A suture was used to  mark the left lobe. The entire thyroid gland was submitted to pathology for review.  The neck was irrigated with warm saline. Fibrillar was placed throughout the operative field. Strap muscles were approximated in the midline with interrupted 3-0 Vicryl sutures. Platysma was closed with interrupted 3-0 Vicryl sutures. Skin was closed with a running 4-0 Monocryl subcuticular suture. Wound was washed and Dermabond was applied. The patient was awakened from anesthesia and brought to the recovery room. The patient tolerated the procedure well.   Armandina Gemma, MD [facility] Office: [place]

## 2021-03-06 NOTE — Anesthesia Postprocedure Evaluation (Addendum)
Anesthesia Post Note  Patient: [name] Le  Procedure(s) Performed: TOTAL THYROIDECTOMY (Neck) LEFT SUPERIOR PARATHYROIDECTOMY (Left: Neck)     Patient location during evaluation: PACU Anesthesia Type: General Level of consciousness: awake and alert, oriented and patient cooperative Pain management: pain level controlled Vital Signs Assessment: post-procedure vital signs reviewed and stable Respiratory status: spontaneous breathing, nonlabored ventilation and respiratory function stable Cardiovascular status: blood pressure returned to baseline and stable Postop Assessment: no apparent nausea or vomiting Anesthetic complications: yes Comments: Narcan given in PACU for poor mental status, poor respiratory effort   No notable events documented.  Last Vitals:  Vitals:   03/06/21 1200 03/06/21 1215  BP: (!) 145/56 (!) 151/89  Pulse: 68 69  Resp: 14 16  Temp:    SpO2: 100% 97%    Last Pain:  Vitals:   03/06/21 1200  TempSrc:   PainSc: [place]

## 2021-03-06 NOTE — Addendum Note (Signed)
Addendum  created 03/06/21 1246 by Pervis Hocking, DO   Alternative orders not taken and original order placed, Order list changed, Pharmacy for encounter modified

## 2021-03-07 ENCOUNTER — Encounter (HOSPITAL_COMMUNITY): Payer: Self-pay | Admitting: Surgery

## 2021-03-07 DIAGNOSIS — C73 Malignant neoplasm of thyroid gland: Secondary | ICD-10-CM | POA: Diagnosis not present

## 2021-03-07 LAB — BASIC METABOLIC PANEL
Anion gap: 7 (ref 5–15)
BUN: 31 mg/dL — ABNORMAL HIGH (ref 8–23)
CO2: 24 mmol/L (ref 22–32)
Calcium: 9.6 mg/dL (ref 8.9–10.3)
Chloride: 104 mmol/L (ref 98–111)
Creatinine, Ser: 1.74 mg/dL — ABNORMAL HIGH (ref 0.44–1.00)
GFR, Estimated: 30 mL/min — ABNORMAL LOW (ref >60)
Glucose, Bld: 144 mg/dL — ABNORMAL HIGH (ref 70–99)
Potassium: 4.2 mmol/L (ref 3.5–5.1)
Sodium: 135 mmol/L (ref 135–145)

## 2021-03-07 LAB — SURGICAL PATHOLOGY

## 2021-03-07 LAB — GLUCOSE, CAPILLARY: Glucose-Capillary: 127 mg/dL — ABNORMAL HIGH (ref 70–99)

## 2021-03-07 MED ORDER — LEVOTHYROXINE SODIUM 88 MCG PO TABS: 88.0000 ug | ORAL_TABLET | Freq: Every day | ORAL | 3 refills | Status: DC

## 2021-03-07 MED ORDER — TRAMADOL HCL 50 MG PO TABS: 50.0000 mg | ORAL_TABLET | Freq: Four times a day (QID) | ORAL | 0 refills | Status: AC | PRN

## 2021-03-07 NOTE — Discharge Instructions (Signed)
[facility]  THYROID & PARATHYROID SURGERY:  POST-OP INSTRUCTIONS  Always review your discharge instruction sheet from the facility where your surgery was performed.  A prescription for pain medication may be given to you upon discharge.  Take your pain medication as prescribed.  If narcotic pain medicine is not needed, then you may take acetaminophen (Tylenol) or ibuprofen (Advil) as needed.  Take your usually prescribed medications unless otherwise directed.  If you need a refill on your pain medication, please contact our office during regular business hours.  Prescriptions cannot be processed by our office after 5 pm or on weekends.  Start with a light diet upon arrival home, such as soup and crackers or toast.  Be sure to drink plenty of fluids daily.  Resume your normal diet the day after surgery.  Most patients will experience some swelling and bruising on the chest and neck area.  Ice packs will help.  Swelling and bruising can take several days to resolve.   It is common to experience some constipation after surgery.  Increasing fluid intake and taking a stool softener (Colace) will usually help or prevent this problem.  A mild laxative (Milk of Magnesia or Miralax) should be taken according to package directions if there has been no bowel movement after 48 hours.  You have steri-strips and a gauze dressing over your incision.  You may remove the gauze bandage on the second day after surgery, and you may shower at that time.  Leave your steri-strips (small skin tapes) in place directly over the incision.  These strips should remain on the skin for 5-7 days and then be removed.  You may get them wet in the shower and pat them dry.  You may resume regular (light) daily activities beginning the next day (such as daily self-care, walking, climbing stairs) gradually increasing activities as tolerated.  You may have sexual intercourse when it is comfortable.  Refrain from  any heavy lifting or straining until approved by your doctor.  You may drive when you no longer are taking prescription pain medication, you can comfortably wear a seatbelt, and you can safely maneuver your car and apply brakes.  You should see your doctor in the office for a follow-up appointment approximately three weeks after your surgery.  Make sure that you call for this appointment within a day or two after you arrive home to insure a convenient appointment time.  WHEN TO CALL YOUR DOCTOR: -- Fever greater than 101.5 -- Inability to urinate -- Nausea and/or vomiting - persistent -- Extreme swelling or bruising -- Continued bleeding from incision -- Increased pain, redness, or drainage from the incision -- Difficulty swallowing or breathing -- Muscle cramping or spasms -- Numbness or tingling in hands or around lips  The clinic staff is available to answer your questions during regular business hours.  Please don't hesitate to call and ask to speak to one of the nurses if you have concerns.  Ramla Hase, MD [facility] Office: [phone] 

## 2021-03-07 NOTE — Progress Notes (Signed)
Pt alert and oriented, tolerating diet. D/C instructions given, pt d/cd to home. 

## 2021-03-16 ENCOUNTER — Telehealth: Payer: Self-pay | Admitting: Internal Medicine

## 2021-03-16 NOTE — Telephone Encounter (Signed)
Patient is requesting an RX for Free Style Libre to be sent to [facility] on [address]

## 2021-03-20 NOTE — Telephone Encounter (Signed)
Pt needs appt. Attempted to contact pt mailbox is full unable to leave message.

## 2021-03-29 ENCOUNTER — Other Ambulatory Visit: Payer: Self-pay | Admitting: Internal Medicine

## 2021-03-29 DIAGNOSIS — E1121 Type 2 diabetes mellitus with diabetic nephropathy: Secondary | ICD-10-CM

## 2021-03-30 ENCOUNTER — Telehealth: Payer: Self-pay | Admitting: Internal Medicine

## 2021-05-03 ENCOUNTER — Other Ambulatory Visit: Payer: Self-pay | Admitting: Internal Medicine

## 2021-05-03 DIAGNOSIS — E1121 Type 2 diabetes mellitus with diabetic nephropathy: Secondary | ICD-10-CM

## 2021-05-03 DIAGNOSIS — Z794 Long term (current) use of insulin: Secondary | ICD-10-CM

## 2021-05-26 ENCOUNTER — Other Ambulatory Visit: Payer: Self-pay | Admitting: Endocrinology

## 2021-05-26 DIAGNOSIS — E1121 Type 2 diabetes mellitus with diabetic nephropathy: Secondary | ICD-10-CM

## 2021-05-29 ENCOUNTER — Other Ambulatory Visit: Payer: Self-pay | Admitting: Internal Medicine

## 2021-05-29 DIAGNOSIS — E1121 Type 2 diabetes mellitus with diabetic nephropathy: Secondary | ICD-10-CM

## 2021-05-29 DIAGNOSIS — Z794 Long term (current) use of insulin: Secondary | ICD-10-CM

## 2021-06-23 ENCOUNTER — Encounter: Payer: Self-pay | Admitting: Internal Medicine

## 2021-06-24 ENCOUNTER — Other Ambulatory Visit: Payer: Self-pay | Admitting: Internal Medicine

## 2021-06-24 DIAGNOSIS — E1121 Type 2 diabetes mellitus with diabetic nephropathy: Secondary | ICD-10-CM

## 2021-06-26 ENCOUNTER — Encounter: Payer: Self-pay | Admitting: Internal Medicine

## 2021-06-26 ENCOUNTER — Ambulatory Visit (INDEPENDENT_AMBULATORY_CARE_PROVIDER_SITE_OTHER): Payer: Medicare Other | Admitting: Internal Medicine

## 2021-06-26 ENCOUNTER — Other Ambulatory Visit: Payer: Self-pay

## 2021-06-26 VITALS — BP 158/76 | HR 68 | Ht 66.0 in | Wt 186.8 lb

## 2021-06-26 DIAGNOSIS — E890 Postprocedural hypothyroidism: Secondary | ICD-10-CM | POA: Diagnosis not present

## 2021-06-26 DIAGNOSIS — E1121 Type 2 diabetes mellitus with diabetic nephropathy: Secondary | ICD-10-CM | POA: Diagnosis not present

## 2021-06-26 DIAGNOSIS — Z794 Long term (current) use of insulin: Secondary | ICD-10-CM | POA: Diagnosis not present

## 2021-06-26 DIAGNOSIS — C73 Malignant neoplasm of thyroid gland: Secondary | ICD-10-CM | POA: Diagnosis not present

## 2021-06-26 DIAGNOSIS — E559 Vitamin D deficiency, unspecified: Secondary | ICD-10-CM | POA: Diagnosis not present

## 2021-06-26 DIAGNOSIS — E213 Hyperparathyroidism, unspecified: Secondary | ICD-10-CM

## 2021-06-26 LAB — TSH: TSH: 1.56 u[IU]/mL (ref 0.35–5.50)

## 2021-06-26 LAB — POCT GLYCOSYLATED HEMOGLOBIN (HGB A1C): Hemoglobin A1C: 6.9 % — AB (ref 4.0–5.6)

## 2021-06-26 LAB — VITAMIN D 25 HYDROXY (VIT D DEFICIENCY, FRACTURES): VITD: 38.21 ng/mL (ref 30.00–100.00)

## 2021-06-26 LAB — T4, FREE: Free T4: 1.04 ng/dL (ref 0.60–1.60)

## 2021-06-26 MED ORDER — NOVOLIN 70/30 RELION (70-30) 100 UNIT/ML ~~LOC~~ SUSP: 15.0000 [IU] | Freq: Two times a day (BID) | SUBCUTANEOUS | 11 refills | Status: DC

## 2021-06-26 MED ORDER — "INSULIN SYRINGE-NEEDLE U-100 31G X 5/16"" 0.3 ML MISC": 3 refills | Status: AC

## 2021-06-26 NOTE — Progress Notes (Signed)
This visit occurred during the SARS-CoV-2 public health emergency.  Safety protocols were in place, including screening questions prior to the visit, additional usage of staff PPE, and extensive cleaning of exam room while observing appropriate contact time as indicated for disinfecting solutions.   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  Last visit 1 year and 6 months ago (telephone).  Interim history: She had parathyroid and thyroidectomy 02/2021 >> feels better after surgery. No increased urination, blurry vision, nausea, chest pain.  Primary hyperparathyroidism She refused surgery initially.  FNA x3 in 12/2013: Benign  Technetium sestamibi parathyroid scan (08/2014) was nonlocalizing  Thyroid ultrasound (01/30/2021): Multinodular goiter with surveillance needed for right isthmic and right inferior nodules  Technetium sestamibi parathyroid scan (02/02/2021): Consistent with left superior parathyroid adenoma  FNA of the right mid thyroid nodule (02/22/2021): benign  Total thyroidectomy with left superior parathyroidectomy (03/06/2021): Pathology:  Left superior hypercellular parathyroid tissue with fibrosis Follicular variant of papillary thyroid cancer measuring 1.5 cm: A. PARATHYROID, LEFT SUPERIOR, PARATHYROIDECTOMY:  - Hypercellular parathyroid tissue associated with fibrosis.  - See comment.   B. THYROID, TOTAL, THYROIDECTOMY:  - Papillary thyroid carcinoma, follicular variant, 1.5 cm.  - Nodular hyperplasia (multinodular goiter).  - Margins not involved.  - See oncology table.   COMMENT:  A.  The parathyroid specimen shows irregular nests of hypercellular  parathyroid tissue with  intervening bands of fibrous tissue.  There is  no necrosis, significant atypia or mitotic activity.   B. ONCOLOGY TABLE:  THYROID GLAND, CARCINOMA: Resection  Procedure: Thyroidectomy.  Tumor Focality: Unifocal.  Tumor Site: Right lobe.  Tumor Size: 1.5 cm, glass slide measurement.  Histologic Type: Papillary thyroid carcinoma, follicular variant.  Angioinvasion: Not identified.  Lymphatic Invasion: Not identified.  Extrathyroidal Extension: Not identified.  Margin Status: All margins negative for carcinoma.       Regional Lymph Node Status: No lymph nodes submitted.  Pathologic Stage Classification (pTNM, AJCC 8th Edition): pT1b, pN not  assigned.  Representative Tumor Block: B1 and B2  Comment(s): The thyroid shows multiple hyperplastic nodules and in the  right lobe there is a 1.5 cm nodule with follicular architecture and  nuclear features of papillary thyroid carcinoma.   Currently on levothyroxine 88 mcg daily: - in am - fasting - at least 30 min from b'fast - no calcium - no iron - + multivitamins - no PPIs - not on Biotin  No results found for: TSH  Reviewed pertinent labs: 03/28/2021: PTH 98, calcium 10.2 Lab Results  Component Value Date   CALCIUM 9.6 03/07/2021   PTH 45 04/09/2017   PTH Comment 04/09/2017   08/18/2020: Calcium 11.2, albumin 4.3, magnesium 2.3 BUN/creatinine 35/1.53, GFR 38, glucose 153 hemoglobin 10.1  06/10/2018: Glucose 89, BUN/creatinine 21/1.19, EGFR 54 Calcium 11.8, corrected 11.7 (8.6-10.3)  Latest vitamin D level was low: Lab Results  Component Value Date   VD25OH 27.54 (L) 04/09/2017   She is on 1000 units vitamin D daily and calcium 220 mg daily in MVI.  24h Urine calcium was at the ULN.  She has osteopenia per review of her DXA scan report from 08/2018.  Pt denies: - feeling nodules in neck - hoarseness - dysphagia - choking - SOB with lying down  DM2:  Reviewed HbA1c levels: Lab  Results  Component Value  Date   HGBA1C 6.4 (H) 03/01/2021   HGBA1C 8.7 (A) 10/14/2018   HGBA1C 10.1 (A) 06/27/2018  08/06/2017: HbA1c 8.8%  Patient is  on: -  stopped since last visit, cannot remember when or why - Novolog 70/30:>> ran out 2 days ago - 30 units before b'fast >> 25 >> 30 >> 20 units >> 15 units - 20 units before dinner 25 >> 20 >> 10 to 15 units >> 10 units >> 15 units  Pt checks her sugars once a day: - 11-11:30: 48 (took insulin at bedtime), 66, 90-196 >> 74-140 >> 130-140, 170 - before lunch: n/c - 2h after lunch: n/c >> 155, 351 (pancakes) >> n/c - before dinner: n/c - 2h after dinner: 150s >> 153-204 >> 82, 90 >> 135-140 >> n/c - bedtime: n/c >> 96 >> n/c - nighttime: n/c >> occasionally 55 x3 >> n/c >> 130-140 Lowest sugar was 40s >>... 48 >> 74; she has hypoglycemia awareness in the 60s. Highest sugar was 204 >> 351 >> 145.  Glucometer: Accuchek Aviva  Pt's meals are: - Brunch: 7 layer salad, ham, [place]; cereal; egg + bacon + apple butter + OJ - Dinner: sandwich with meat; bowl of jello - Snacks: icecream, cookies, potato chips - after dinner, at 12 am-1 am  -+ Mild CKD, last BUN/creatinine:  Lab Results  Component Value Date   BUN 31 (H) 03/07/2021   BUN 21 03/01/2021   CREATININE 1.74 (H) 03/07/2021   CREATININE 1.29 (H) 03/01/2021  06/10/2018: Glucose 89, BUN/creatinine 21/1.19, EGFR 54 Previously on losartan, now off. On Olmesartan 40.  -+ HL; lipids not available for review: No results found for: CHOL, HDL, LDLCALC, LDLDIRECT, TRIG, CHOLHDL  On Pravastatin 8.  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  ROS: + see HPI  I reviewed pt's medications, allergies, PMH, social hx, family hx, and changes were documented in the history of present illness. Otherwise, unchanged from my initial visit note.  Past Medical History:   Diagnosis Date   CKD (chronic kidney disease), stage III ([place])    Diabetes mellitus without complication ([place])    High cholesterol    Hypertension    Nontoxic goiter    Symptomatic anemia 02/04/2017   Past Surgical History:  Procedure Laterality Date   ABDOMINAL HYSTERECTOMY     COLONOSCOPY WITH PROPOFOL N/A 02/05/2017   Procedure: COLONOSCOPY WITH PROPOFOL;  Surgeon: Doran Stabler, MD;  Location: [facility];  Service: Endoscopy;  Laterality: N/A;   ESOPHAGOGASTRODUODENOSCOPY (EGD) WITH PROPOFOL N/A 02/05/2017   Procedure: ESOPHAGOGASTRODUODENOSCOPY (EGD) WITH PROPOFOL;  Surgeon: Doran Stabler, MD;  Location: [facility];  Service: Endoscopy;  Laterality: N/A;   PARATHYROIDECTOMY Left 03/06/2021   Procedure: LEFT SUPERIOR PARATHYROIDECTOMY;  Surgeon: Armandina Gemma, MD;  Location: [facility];  Service: General;  Laterality: Left;   REPLACEMENT TOTAL KNEE Right    THYROIDECTOMY N/A 03/06/2021   Procedure: TOTAL THYROIDECTOMY;  Surgeon: Armandina Gemma, MD;  Location: [facility];  Service: General;  Laterality: N/A;   Social History   Socioeconomic History   Marital status: Married    Spouse name: Not on file   Number of children: 1   Years of education: Not on file   Highest education level: Not on file  Occupational History   Occupation: retired  Tobacco  Use   Smoking status: Never   Smokeless tobacco: Never  Vaping Use   Vaping Use: Never used  Substance and Sexual Activity   Alcohol use: Yes    Comment: wine occasional per pt.    Drug use: No    Types: Marijuana    Comment: Previously smoked. Quit in 1981   Sexual activity: Not on file  Other Topics Concern   Not on file  Social History Narrative   Not on file   Social Determinants of Health   Financial Resource Strain: Not on file  Food Insecurity: Not on file  Transportation Needs: Not on file  Physical Activity: Not on file  Stress: Not on file  Social Connections: Not on file  Intimate Partner Violence: Not  on file   Current Outpatient Medications on File Prior to Visit  Medication Sig Dispense Refill   ACCU-CHEK AVIVA PLUS test strip USE AS INSTRUCTED 3-4 TIMES A DAY (NEED APT FOR MORE REFILLS) 100 strip 0   albuterol (VENTOLIN HFA) 108 (90 Base) MCG/ACT inhaler Inhale 2 puffs into the lungs every 6 (six) hours as needed for wheezing or shortness of breath.     aspirin EC 81 MG tablet Take 81 mg by mouth daily.     Aspirin-Acetaminophen-Caffeine (GOODY HEADACHE PO) Take 1 packet by mouth daily as needed (pain/headache).     budesonide-formoterol (SYMBICORT) 160-4.5 MCG/ACT inhaler Inhale 2 puffs into the lungs 2 (two) times daily as needed (wheezing or shortness of breath).     Cholecalciferol (VITAMIN D) 50 MCG (2000 UT) tablet Take 2,000 Units by mouth daily.     Cyanocobalamin 1500 MCG TBDP Take 1,500 mcg by mouth daily.     ELDERBERRY PO Take 100 mg by mouth daily.     hydrALAZINE (APRESOLINE) 100 MG tablet Take 100 mg by mouth 3 (three) times daily.     Insulin Pen Needle (PEN NEEDLES) 32G X 5 MM MISC 1 each by Does not apply route in the morning and at bedtime. Use with insulin pen 2 times a day.DUE FOR APPOINTMENT 100 each 0   lactulose (CHRONULAC) 10 GM/15ML solution Take 15-30 cc daily as needed for bowels (Patient taking differently: Take 10-20 g by mouth daily as needed for moderate constipation.) 236 mL 11   latanoprost (XALATAN) 0.005 % ophthalmic solution Place 1 drop into both eyes at bedtime.     levothyroxine (SYNTHROID) 88 MCG tablet Take 1 tablet (88 mcg total) by mouth daily before breakfast. 30 tablet 3   metFORMIN (GLUCOPHAGE) 500 MG tablet TAKE 1 TABLET BY MOUTH TWICE A DAY 60 tablet 2   metoprolol tartrate (LOPRESSOR) 100 MG tablet Take 100 mg by mouth 2 (two) times daily.     Multiple Vitamins-Minerals (CENTRUM SILVER ADULT 50+) TABS Take 1 tablet by mouth daily.     NOVOLOG MIX 70/30 FLEXPEN (70-30) 100 UNIT/ML FlexPen INJECT 10 UNITS UNDER SKIN 30 MIN BEFORE BREAKFAST &  20 UNITS BEFORE DINNER 15 mL 0   olmesartan (BENICAR) 40 MG tablet Take 40 mg by mouth daily.     pravastatin (PRAVACHOL) 80 MG tablet Take 80 mg by mouth daily.  5   Propylene Glycol 0.6 % SOLN Place 1 drop into both eyes daily as needed (swelling or redness).     traMADol (ULTRAM) 50 MG tablet Take 1-2 tablets (50-100 mg total) by mouth every 6 (six) hours as needed for moderate pain. 15 tablet 0   No current facility-administered medications on file prior to  visit.   Allergies  Allergen Reactions   Ace Inhibitors Cough   Family History  Problem Relation Age of Onset   Stroke Mother    Cancer Father        pts states cancer in back   Lung cancer Sister    Heart disease Sister    Cancer Brother        "   Colon cancer Neg Hx     PE: There were no vitals taken for this visit. Wt Readings from Last 3 Encounters:  03/06/21 185 lb 10 oz (84.2 kg)  03/01/21 185 lb 12.8 oz (84.3 kg)  10/28/19 179 lb (81.2 kg)   Constitutional: overweight, in NAD Eyes: PERRLA, EOMI, no exophthalmos ENT: moist mucous membranes, no neck masses palpated, thyroidectomy scar healing, no cervical lymphadenopathy Cardiovascular: RRR, No MRG Respiratory: CTA B Gastrointestinal: abdomen soft, NT, ND, BS+ Musculoskeletal: no deformities, strength intact in all 4 Skin: moist, warm, no rashes Neurological: no tremor with outstretched hands, DTR normal in all 4  ASSESSMENT: 1. DM2, insulin-dependent, uncontrolled, with complications - CKD stage 3  2. Primary  HPTH  3. Vitamin D deficiency  4.  Follicular variant of papillary thyroid cancer  5.  Postsurgical hypothyroidism  PLAN:  1. Patient with uncontrolled type 2 diabetes, with improved HbA1c at last check in 02/2021, when HbA1c was much lower, at 6.4%.  She continues on metformin ER and also premixed insulin regimen.  She takes a higher dose of NovoLog in the morning and the lower dose before dinner. -At this visit, her sugars appear to be  variable, and upon questioning, she is not necessarily taking the 70/30 insulin correctly.  If the sugars are normal before the meal, she may not take any insulin.  I advised her that she does need to take insulin based on the size of the meals and she can adjust this based on the sugars before meals.  As of now, she is mostly taking 15 units before breakfast and dinner.  We discussed about possibly adjusting the dose up with a larger meals, to 20 units. -She mentions that her 70/30 insulin pen is expensive.  We discussed about using the ReliOn insulin from [place].  I explained that this is in the vial and I printed her a prescription for the vials and syringes to take to her [facility] of choice.  I advised her to inject this insulin 30 minutes before the meals, rather than 15 minutes before the meals. - I suggested to:  Patient Instructions  Please stop at the lab.  Please continue Levothyroxine 88 mcg daily.  Take the thyroid hormone every day, with water, at least 30 minutes before breakfast, separated by at least 4 hours from: - acid reflux medications - calcium - iron - multivitamins  Use: Novolin 70/30: - 15-20 units 30 min before b'fast - 15-20 units 30 min before dinner  STOP multivitamin.  Please come back for a follow-up appointment in 4 months.  - we checked her HbA1c: 6.9% (higher) - advised to check sugars at different times of the day - 2-3x a day, rotating check times - advised for yearly eye exams >> she is not UTD - return to clinic in 3-4 months     2. Primary  HPTH and 3. vitamin D deficiency -s/p left superior parathyroidectomy with resection of hypercellular parathyroid focus -She feels much better after the surgery. -Most recent calcium level was normal after surgery but the PTH was still high, at  98.  We will repeat calcium today along with a PTH level -However, she is on multivitamins with 220 mg of calcium daily.  I advised her to stop the multivitamins -She  was previously on vitamin D 2000 units daily, now on 1000 units vitamin D daily from vitamins -We will recheck her vitamin D level today and may need to restart the 2000 units supplement.  4.  Follicular variant of papillary thyroid cancer -She has a history of multinodular goiter, with previous benign biopsies.  However, she had total thyroidectomy in 02/2021 and a 1.5 cm focus of follicular variant of papillary thyroid cancer was found -we discussed about a good prognosis of thyroid cancer -we will check a thyroglobulin and ATA antibodies today -no need for RAI ablation since the thyroid cancer focus was small and without high risk features  5.  Postsurgical hypothyroidism - she was started  on LT4 88 mcg daily after surgery - pt feels good on this dose. - we discussed about taking the thyroid hormone every day, with water, >30 minutes before breakfast, separated by >4 hours from acid reflux medications, calcium, iron, multivitamins. Pt. is taking it correctly. - will check thyroid tests today: TSH and fT4 - If labs are abnormal, she will need to return for repeat TFTs in 1.5 months  - Total time spent for the visit: 40 minutes, in obtaining medical information from the chart and from the patient, reviewing her  previous labs, imaging evaluations, surgical reports, medication treatments, and Dr. Gala Lewandowsky office visit notes, reviewing her symptoms, counseling her about her conditions (please see the discussed topics above), and developing a plan to further investigate and treat them; she had a number of questions which I addressed.  Orders Placed This Encounter  Procedures   TSH   T4, free   VITAMIN D 25 Hydroxy (Vit-D Deficiency, Fractures)   PTH, intact and calcium   Thyroglobulin antibody   Thyroglobulin Level   POCT HgB A1C   Component     Latest Ref Rng & Units 06/26/2021          Calcium     8.7 - 10.3 mg/dL 10.5 (H)  PTH, Intact     15 - 65 pg/mL 80 (H)  PTH Interp       Comment  Thyroglobulin     ng/mL 0.3 (L)  Comment        VITD     30.00 - 100.00 ng/mL 38.21  Hemoglobin A1C     4.0 - 5.6 % 6.9 (A)  TSH     0.35 - 5.50 uIU/mL 1.56  T4,Free(Direct)     0.60 - 1.60 ng/dL 1.04  Thyroglobulin Ab     < or = 1 IU/mL <1   Thyroid tests are normal so we can continue the same dose of levothyroxine.  Thyroglobulin is very low, and antibodies against thyroglobulin are undetectable.  We will continue to follow these.  Vitamin D level is normal.  We will stop her multivitamin which contains 1000 units of vitamin D.  I will advise her to take her 2000 units supplement every other day.  We will recheck the level at next visit.  Calcium remains high and PTH is also elevated, but lower than before.  I advised her to stop her calcium supplements (now taking a multivitamin) and I will recheck her calcium and PTH at next visit.  It is possible that this decrease slowly after the surgery.  We will forward these results to  Dr. Harlow Asa, too.  Philemon Kingdom, MD PhD [facility] Endocr[facility]

## 2021-06-26 NOTE — Patient Instructions (Addendum)
Please stop at the lab.  Please continue Levothyroxine 88 mcg daily.  Take the thyroid hormone every day, with water, at least 30 minutes before breakfast, separated by at least 4 hours from: - acid reflux medications - calcium - iron - multivitamins  Use: Novolin 70/30: - 15-20 units 30 min before b'fast - 15-20 units 30 min before dinner  STOP multivitamin.  Please come back for a follow-up appointment in 4 months.

## 2021-06-27 ENCOUNTER — Encounter: Payer: Self-pay | Admitting: Internal Medicine

## 2021-06-27 LAB — THYROGLOBULIN LEVEL: Thyroglobulin: 0.3 ng/mL — ABNORMAL LOW

## 2021-06-27 LAB — PTH, INTACT AND CALCIUM
Calcium: 10.5 mg/dL — ABNORMAL HIGH (ref 8.7–10.3)
PTH: 80 pg/mL — ABNORMAL HIGH (ref 15–65)

## 2021-06-27 LAB — THYROGLOBULIN ANTIBODY: Thyroglobulin Ab: <1 IU/mL (ref <=1)

## 2021-06-27 NOTE — Progress Notes (Signed)
Transcribing error: She definitely should stop the multivitamin and not start... But she needs to start vitamin D 2000 units every other day.

## 2021-06-29 ENCOUNTER — Other Ambulatory Visit (HOSPITAL_COMMUNITY): Payer: Self-pay

## 2021-06-29 ENCOUNTER — Telehealth: Payer: Self-pay | Admitting: Pharmacy Technician

## 2021-06-29 NOTE — Telephone Encounter (Signed)
Patient Advocate Encounter   Received notification from [employer] that prior authorization for NOvolog Mix is required.   Per test claim it's just too soon. Pt got a 50 day supply on 06/03/21. Dose changed slightly, but even after entering the therapy change code, it says they will pay for it 10/23.     Clinic will continue to follow:   Armanda Magic, CPhT [facility] Phone: [phone] Fax:  [phone]

## 2021-07-08 ENCOUNTER — Other Ambulatory Visit: Payer: Self-pay | Admitting: Internal Medicine

## 2021-07-08 DIAGNOSIS — Z794 Long term (current) use of insulin: Secondary | ICD-10-CM

## 2021-07-08 DIAGNOSIS — E1121 Type 2 diabetes mellitus with diabetic nephropathy: Secondary | ICD-10-CM

## 2021-07-10 ENCOUNTER — Telehealth: Payer: Self-pay | Admitting: Internal Medicine

## 2021-07-10 MED ORDER — LEVOTHYROXINE SODIUM 88 MCG PO TABS: 88.0000 ug | ORAL_TABLET | Freq: Every day | ORAL | 3 refills | Status: DC

## 2021-07-10 NOTE — Telephone Encounter (Signed)
Levothyroxine has now been sent into patient's pharmacy.

## 2021-07-10 NOTE — Telephone Encounter (Signed)
MEDICATION: levothyroxine (SYNTHROID) 88 MCG tablet   PH[facility]ACY[facility] [address]  HAS THE PATIENT CONTACTED THEIR PHARMACY?  yes  IS THIS A 90 DAY SUPPLY : unknown  IS PATIENT OUT OF MEDICATION: yes - not had any for last 4 days  IF NOT; HOW MUCH IS LEFT: none  LAST APPOINTMENT DATE: @10 /29/2022  DO WE HAVE YOUR PERMISSION TO LEAVE A DETAILED MESSAGE?: yes  OTHER COMMENTS:    **Let patient know to contact pharmacy at the end of the day to make sure medication is ready. **  ** Please notify patient to allow 48-72 hours to process**  **Encourage patient to contact the pharmacy for refills or they can request refills through [facility]**

## 2021-08-01 ENCOUNTER — Other Ambulatory Visit: Payer: Self-pay | Admitting: Internal Medicine

## 2021-08-01 DIAGNOSIS — Z794 Long term (current) use of insulin: Secondary | ICD-10-CM

## 2021-08-04 ENCOUNTER — Ambulatory Visit: Payer: Self-pay

## 2021-08-04 NOTE — Telephone Encounter (Signed)
Patient called, left VM to return the call to the office to discuss concern she has.   Message from [place] sent at 08/04/2021  3:23 PM EST  Pt called and is requesting to know if its okay to cook a [place] that has been sitting out for 2 days. Please advise.

## 2021-08-05 NOTE — Telephone Encounter (Signed)
Pt called and is requesting to know if its okay to cook a [place] that has been sitting out for 2 days. Please advise  2nd call attempted. No answer, left message to call back at [phone] for further questions regarding cooking a [place] that has been sitting out for 2 days.

## 2021-08-05 NOTE — Telephone Encounter (Signed)
Called patient to review information regarding cooking a [place] that was sitting out for 2 days. Patient reports she threw [place] away. No further questions . Instructed patient bacteria can multiply quickly and is best not to cook after sitting out greater than 2 hours. Patient verbalized understanding.  Reason for Disposition  General information question, no triage required and triager able to answer question  Answer Assessment - Initial Assessment Questions 1. REASON FOR CALL or QUESTION: "What is your reason for calling today?" or "How can I best help you?" or "What question do you have that I can help answer?"     Requesting to know if it is ok to cook a [place] that has been sitting out for 2 days.  Protocols used: Information Only Call - No Triage-A-AH

## 2021-08-09 ENCOUNTER — Telehealth: Payer: Self-pay | Admitting: Internal Medicine

## 2021-08-09 NOTE — Telephone Encounter (Signed)
A, She absolutely needs to take the 70/30 Novolin in the morning, approximately 30 minutes before breakfast.  If the sugars drop too low after taking 15-20 units, she may take 10 to 15 units but she should not skip it.  If she takes the insulin only after her sugars increase, it would be too late... Would she want to come in and talk to the diabetes educator and get further instructions about how to take the insulin?  I can put the referral in.

## 2021-08-09 NOTE — Telephone Encounter (Signed)
PT called concerning high BS range between 200 to 250. Please call ASAP.

## 2021-08-10 NOTE — Telephone Encounter (Signed)
Spoke with the patient regarding her continuation of taking Novolin and how she needs to take 15-20 units 30 mins before breakfast and if se notices her sugars  dropping too low to decrease to 10-15 units. Patient expressed understanding and says that she will see how it works out before a referral to the diabetes educator.

## 2021-08-21 ENCOUNTER — Other Ambulatory Visit: Payer: Self-pay

## 2021-08-21 ENCOUNTER — Other Ambulatory Visit (HOSPITAL_BASED_OUTPATIENT_CLINIC_OR_DEPARTMENT_OTHER): Payer: Self-pay

## 2021-08-21 ENCOUNTER — Ambulatory Visit: Payer: Medicare Other | Attending: Internal Medicine

## 2021-08-21 DIAGNOSIS — Z23 Encounter for immunization: Secondary | ICD-10-CM

## 2021-08-21 MED ORDER — PFIZER COVID-19 VAC BIVALENT 30 MCG/0.3ML IM SUSP
INTRAMUSCULAR | 0 refills | Status: AC
  Filled 2021-08-21: qty 0.3, 1d supply, fill #0

## 2021-08-21 NOTE — Progress Notes (Signed)
   Covid-19 Vaccination Clinic  Name:  [name] Le    MRN: [id number] DOB: June 23, 1946  08/21/2021  [name] was observed post Covid-19 immunization for 15 minutes without incident. She was provided with Vaccine Information Sheet and instruction to access the V-Safe system.   [name] was instructed to call 911 with any severe reactions post vaccine: Difficulty breathing  Swelling of face and throat  A fast heartbeat  A bad rash all over body  Dizziness and weakness   Immunizations Administered     Name Date Dose VIS Date Route   Pfizer Covid-19 Vaccine Bivalent Booster 08/21/2021 11:54 AM 0.3 mL 05/10/2021 Intramuscular   Manufacturer: [place]   Lot: [id number]   [place]: [phone]

## 2021-09-25 ENCOUNTER — Telehealth: Payer: Self-pay | Admitting: Internal Medicine

## 2021-09-25 NOTE — Telephone Encounter (Signed)
Patient requests to be called at ph# [phone] re: Patient states Patient is still having high blood sugars (approx. 200-sometimes higher) and states that insulin NPH-regular Human (NOVOLIN 70/30 RELION) (70-30) 100 UNIT/ML injection is not helping.  Patient further requests a new Rx for the following:  MEDICATION: NOVOLOG MIX 70/30 FLEXPEN (70-30) 100 UNIT/ML FlexPen  [facility]ACY[facility]   [id number] [place] [place] [facility] [place] Phone:  [phone]  Fax:  [phone]     HAS THE PATIENT CONTACTED THEIR PHARMACY?  No  IS THIS A 90 DAY SUPPLY : No  IS PATIENT OUT OF MEDICATION: No-Patient states she has 2 Novolog Flexpens   IF NOT; HOW MUCH IS LEFT: Approx. 2 days  LAST APPOINTMENT DATE: @11 /30/2022  NEXT APPOINTMENT DATE:@02 /23/2023  DO WE HAVE YOUR PERMISSION TO LEAVE A DETAILED MESSAGE?: Yes  OTHER COMMENTS:    **Let patient know to contact pharmacy at the end of the day to make sure medication is ready. **  ** Please notify patient to allow 48-72 hours to process**  **Encourage patient to contact the pharmacy for refills or they can request refills through [facility]**

## 2021-09-26 NOTE — Telephone Encounter (Signed)
Vm left for patient to callback with bs readings and to find out if she is taking the Novolin as advised.

## 2021-10-05 ENCOUNTER — Other Ambulatory Visit: Payer: Self-pay | Admitting: Internal Medicine

## 2021-11-02 ENCOUNTER — Encounter: Payer: Self-pay | Admitting: Internal Medicine

## 2021-11-02 ENCOUNTER — Ambulatory Visit (INDEPENDENT_AMBULATORY_CARE_PROVIDER_SITE_OTHER): Payer: Medicare Other | Admitting: Internal Medicine

## 2021-11-02 ENCOUNTER — Other Ambulatory Visit: Payer: Self-pay

## 2021-11-02 VITALS — BP 128/62 | Ht 66.0 in | Wt 189.0 lb

## 2021-11-02 DIAGNOSIS — E559 Vitamin D deficiency, unspecified: Secondary | ICD-10-CM

## 2021-11-02 DIAGNOSIS — E890 Postprocedural hypothyroidism: Secondary | ICD-10-CM

## 2021-11-02 DIAGNOSIS — E1121 Type 2 diabetes mellitus with diabetic nephropathy: Secondary | ICD-10-CM | POA: Diagnosis not present

## 2021-11-02 DIAGNOSIS — Z794 Long term (current) use of insulin: Secondary | ICD-10-CM

## 2021-11-02 DIAGNOSIS — C73 Malignant neoplasm of thyroid gland: Secondary | ICD-10-CM | POA: Diagnosis not present

## 2021-11-02 DIAGNOSIS — E213 Hyperparathyroidism, unspecified: Secondary | ICD-10-CM | POA: Diagnosis not present

## 2021-11-02 LAB — POCT GLYCOSYLATED HEMOGLOBIN (HGB A1C): Hemoglobin A1C: 8.2 % — AB (ref 4.0–5.6)

## 2021-11-02 LAB — VITAMIN D 25 HYDROXY (VIT D DEFICIENCY, FRACTURES): VITD: 33.29 ng/mL (ref 30.00–100.00)

## 2021-11-02 MED ORDER — NOVOLOG MIX 70/30 FLEXPEN (70-30) 100 UNIT/ML ~~LOC~~ SUPN: PEN_INJECTOR | SUBCUTANEOUS | 3 refills | Status: DC

## 2021-11-02 NOTE — Patient Instructions (Addendum)
Please stop at the lab.  Please continue Levothyroxine 88 mcg daily.  Take the thyroid hormone every day, with water, at least 30 minutes before breakfast, separated by at least 4 hours from: - acid reflux medications - calcium - iron - multivitamins  Use Novolog 70/30: - 20-24 units 15 min before b'fast - 20-24 units 15 min before dinner  Please continue vitamin D 2000 units daily.  Please come back for a follow-up appointment in 3-4 months.

## 2021-11-02 NOTE — Progress Notes (Signed)
This visit occurred during the SARS-CoV-2 public health emergency.  Safety protocols were in place, including screening questions prior to the visit, additional usage of staff PPE, and extensive cleaning of exam room while observing appropriate contact time as indicated for disinfecting solutions.   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  Last visit 4 months ago.  Interim history: She had parathyroid and thyroidectomy 02/2021 >> started to feel better after the surgery. No increased urination, blurry vision, nausea, chest pain. She had eyelift surgery 2 weeks ago.  Primary hyperparathyroidism She refused surgery initially.  FNA x3 in 12/2013: Benign  Technetium sestamibi parathyroid scan (08/2014) was nonlocalizing  Thyroid ultrasound (01/30/2021): Multinodular goiter with surveillance needed for right isthmic and right inferior nodules  Technetium sestamibi parathyroid scan (02/02/2021): Consistent with left superior parathyroid adenoma  FNA of the right mid thyroid nodule (02/22/2021): benign  Total thyroidectomy with left superior parathyroidectomy (03/06/2021): Pathology:  Left superior hypercellular parathyroid tissue with fibrosis Follicular variant of papillary thyroid cancer measuring 1.5 cm: A. PARATHYROID, LEFT SUPERIOR, PARATHYROIDECTOMY:  - Hypercellular parathyroid tissue associated with fibrosis.  - See comment.   B. THYROID, TOTAL, THYROIDECTOMY:  - Papillary thyroid carcinoma, follicular variant, 1.5 cm.  - Nodular hyperplasia (multinodular goiter).  - Margins not involved.  - See oncology table.   COMMENT:  A.  The parathyroid specimen shows irregular nests of hypercellular   parathyroid tissue with intervening bands of fibrous tissue.  There is  no necrosis, significant atypia or mitotic activity.   B. ONCOLOGY TABLE:  THYROID GLAND, CARCINOMA: Resection  Procedure: Thyroidectomy.  Tumor Focality: Unifocal.  Tumor Site: Right lobe.  Tumor Size: 1.5 cm, glass slide measurement.  Histologic Type: Papillary thyroid carcinoma, follicular variant.  Angioinvasion: Not identified.  Lymphatic Invasion: Not identified.  Extrathyroidal Extension: Not identified.  Margin Status: All margins negative for carcinoma.       Regional Lymph Node Status: No lymph nodes submitted.  Pathologic Stage Classification (pTNM, AJCC 8th Edition): pT1b, pN not  assigned.  Representative Tumor Block: B1 and B2  Comment(s): The thyroid shows multiple hyperplastic nodules and in the  right lobe there is a 1.5 cm nodule with follicular architecture and  nuclear features of papillary thyroid carcinoma.   Currently on levothyroxine 88 mcg daily: - in am - fasting - at least 30 min from b'fast - no calcium - no iron - stopped multivitamins - no PPIs - not on Biotin  Lab Results  Component Value Date   TSH 1.56 06/26/2021   Reviewed pertinent labs: Lab Results  Component Value Date   CALCIUM 10.5 (H) 06/26/2021   PTH 80 (H) 06/26/2021   PTH Comment 06/26/2021  03/28/2021: PTH 98, calcium 10.2  08/18/2020: Calcium 11.2, albumin 4.3, magnesium 2.3 BUN/creatinine 35/1.53, GFR 38, glucose 153 hemoglobin 10.1  06/10/2018: Glucose 89, BUN/creatinine 21/1.19, EGFR 54 Calcium 11.8, corrected 11.7 (8.6-10.3)  Latest vitamin D level was low: Lab Results  Component Value Date   VD25OH 38.21 06/26/2021   She was on 1000 units vitamin D daily and calcium 220 mg daily in MVI >> we stopped multivitamins and I advised her to start 2000 units vitamin D daily.  She is not quite sure whether she is taking this and does not have it with her  today.  She will check at home.  24h Urine  calcium was at the ULN.  She has osteopenia per review of her DXA scan report from 08/2018.  Pt denies: - feeling nodules in neck - hoarseness - dysphagia - choking - SOB with lying down  DM2:  Reviewed HbA1c levels: Lab Results  Component Value Date   HGBA1C 6.9 (A) 06/26/2021   HGBA1C 6.4 (H) 03/01/2021   HGBA1C 8.7 (A) 10/14/2018  08/06/2017: HbA1c 8.8%  Patient is  on: -  stopped since last visit, cannot remember when or why - Novolog 70/30:>> ran out 2 days before last OV >> Novolin 70/30 >> back Novolog 70/30: - 30 units before b'fast >> 25 >> 30 >> 20 units >> 15 >> 20 units - 20 units before dinner 25 >> 20 >> 10 to 15 units >> 10 units >> 15 >> 20 units  - at bedtime Pt checks her sugars once a day: - 11-11:30 am: 48 insulin at bedtime), 66, 90-196 >> 74-140 >> 130-140, 170 >> 95-269 - before lunch: n/c >> 196 - 2h after lunch: n/c >> 155, 351 (pancakes) >> n/c - before dinner: n/c >> 185-190 - 2h after dinner: 150s >> 153-204 >> 82, 90 >> 135-140 >> n/c - bedtime: n/c >> 96 >> n/c - nighttime: n/c >> occasionally 55 x3 >> n/c >> 130-140 >> n/c Lowest sugar was 40s >>... 48 >> 74 >> 95; she has hypoglycemia awareness in the 60s. Highest sugar was 204 >> 351 >> 145 >> 400.  Glucometer: Accuchek Aviva  Pt's meals are: - Brunch: 7 layer salad, ham, [place]; cereal; egg + bacon + apple butter + OJ; fruit cocktail - Dinner: sandwich with meat; bowl of jello - Snacks: icecream, cookies, potato chips - after dinner, at 12 am-1 am  -+ Mild CKD, last BUN/creatinine:  Lab Results  Component Value Date   BUN 31 (H) 03/07/2021   BUN 21 03/01/2021   CREATININE 1.74 (H) 03/07/2021   CREATININE 1.29 (H) 03/01/2021  06/10/2018: Glucose 89, BUN/creatinine 21/1.19, EGFR 54 On Olmesartan 40.  -+ HL; lipids not available for review: No results found for: CHOL, HDL, LDLCALC, LDLDIRECT, TRIG, CHOLHDL  On Pravastatin 8.  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  ROS: + see HPI  I reviewed pt's medications, allergies, PMH, social hx, family hx, and changes were documented in the history of present illness. Otherwise, unchanged from my initial visit note.  Past Medical History:  Diagnosis Date   CKD (chronic kidney disease), stage III ([place])    Diabetes mellitus without complication ([place])    High cholesterol    Hypertension    Nontoxic goiter    Symptomatic anemia 02/04/2017   Past Surgical History:  Procedure Laterality Date   ABDOMINAL HYSTERECTOMY     COLONOSCOPY WITH PROPOFOL N/A 02/05/2017   Procedure: COLONOSCOPY WITH PROPOFOL;  Surgeon: Doran Stabler, MD;  Location: [facility];  Service: Endoscopy;  Laterality: N/A;   ESOPHAGOGASTRODUODENOSCOPY (EGD) WITH PROPOFOL N/A 02/05/2017   Procedure: ESOPHAGOGASTRODUODENOSCOPY (EGD) WITH PROPOFOL;  Surgeon: Doran Stabler, MD;  Location: [facility];  Service: Endoscopy;  Laterality: N/A;   PARATHYROIDECTOMY Left 03/06/2021   Procedure: LEFT SUPERIOR PARATHYROIDECTOMY;  Surgeon: Armandina Gemma, MD;  Location: [facility];  Service: General;  Laterality: Left;   REPLACEMENT TOTAL KNEE  Right    THYROIDECTOMY N/A 03/06/2021   Procedure: TOTAL THYROIDECTOMY;  Surgeon: Armandina Gemma, MD;  Location: [facility];  Service: General;  Laterality: N/A;   Social History   Socioeconomic History   Marital status: Married    Spouse name: Not on file   Number of children: 1   Years of education: Not on file   Highest education level: Not on file  Occupational History   Occupation: retired  Tobacco Use   Smoking status: Never   Smokeless tobacco: Never  Vaping Use   Vaping Use: Never used  Substance and Sexual Activity   Alcohol use: Yes    Comment: wine occasional per pt.    Drug use: No    Types: Marijuana    Comment: Previously smoked. Quit in 1981   Sexual  activity: Not on file  Other Topics Concern   Not on file  Social History Narrative   Not on file   Social Determinants of Health   Financial Resource Strain: Not on file  Food Insecurity: Not on file  Transportation Needs: Not on file  Physical Activity: Not on file  Stress: Not on file  Social Connections: Not on file  Intimate Partner Violence: Not on file   Current Outpatient Medications on File Prior to Visit  Medication Sig Dispense Refill   albuterol (VENTOLIN HFA) 108 (90 Base) MCG/ACT inhaler Inhale 2 puffs into the lungs every 6 (six) hours as needed for wheezing or shortness of breath.     aspirin EC 81 MG tablet Take 81 mg by mouth daily.     Aspirin-Acetaminophen-Caffeine (GOODY HEADACHE PO) Take 1 packet by mouth daily as needed (pain/headache).     budesonide-formoterol (SYMBICORT) 160-4.5 MCG/ACT inhaler Inhale 2 puffs into the lungs 2 (two) times daily as needed (wheezing or shortness of breath).     chlorthalidone (HYGROTON) 25 MG tablet Take 25 mg by mouth daily. 1 tablet three times a week     Cholecalciferol (VITAMIN D) 50 MCG (2000 UT) tablet Take 2,000 Units by mouth daily.     COVID-19 mRNA bivalent vaccine, Pfizer, (PFIZER COVID-19 VAC BIVALENT) injection Inject into the muscle. 0.3 mL 0   Cyanocobalamin 1500 MCG TBDP Take 1,500 mcg by mouth daily.     ELDERBERRY PO Take 100 mg by mouth daily.     glucose blood (ACCU-CHEK AVIVA PLUS) test strip USE AS INSTRUCTED 3-4 TIMES A DAY 400 strip 3   hydrALAZINE (APRESOLINE) 100 MG tablet Take 100 mg by mouth 3 (three) times daily.     insulin NPH-regular Human (NOVOLIN 70/30 RELION) (70-30) 100 UNIT/ML injection Inject 15-20 Units into the skin 2 (two) times daily before a meal. 20 mL 11   Insulin Pen Needle (PEN NEEDLES) 32G X 5 MM MISC 1 each by Does not apply route in the morning and at bedtime. Use with insulin pen 2 times a day.DUE FOR APPOINTMENT 100 each 0   Insulin Syringe-Needle U-100 (B-D INSULIN SYRINGE) 31G  X 5/16" 0.3 ML MISC Use 2-3x a day 200 each 3   lactulose (CHRONULAC) 10 GM/15ML solution Take 15-30 cc daily as needed for bowels (Patient taking differently: Take 10-20 g by mouth daily as needed for moderate constipation.) 236 mL 11   latanoprost (XALATAN) 0.005 % ophthalmic solution Place 1 drop into both eyes at bedtime.     levothyroxine (SYNTHROID) 88 MCG tablet TAKE 1 TABLET BY MOUTH DAILY BEFORE BREAKFAST. 90 tablet 1   metFORMIN (GLUCOPHAGE) 500 MG tablet  TAKE 1 TABLET BY MOUTH TWICE A DAY 60 tablet 2   metoprolol tartrate (LOPRESSOR) 100 MG tablet Take 100 mg by mouth 2 (two) times daily.     Multiple Vitamins-Minerals (CENTRUM SILVER ADULT 50+) TABS Take 1 tablet by mouth daily.     NOVOLOG MIX 70/30 FLEXPEN (70-30) 100 UNIT/ML FlexPen INJECT 10 UNITS UNDER SKIN 30 MIN BEFORE BREAKFAST & 20 UNITS BEFORE DINNER 15 mL 0   olmesartan (BENICAR) 40 MG tablet Take 40 mg by mouth daily.     pravastatin (PRAVACHOL) 80 MG tablet Take 80 mg by mouth daily.  5   Propylene Glycol 0.6 % SOLN Place 1 drop into both eyes daily as needed (swelling or redness).     traMADol (ULTRAM) 50 MG tablet Take 1-2 tablets (50-100 mg total) by mouth every 6 (six) hours as needed for moderate pain. 15 tablet 0   No current facility-administered medications on file prior to visit.   Allergies  Allergen Reactions   Ace Inhibitors Cough   Family History  Problem Relation Age of Onset   Stroke Mother    Cancer Father        pts states cancer in back   Lung cancer Sister    Heart disease Sister    Cancer Brother        "   Colon cancer Neg Hx     PE: BP 128/62 (BP Location: Right Arm, Patient Position: Sitting, Cuff Size: Normal)    Ht '5\' 6"'  (1.676 m)    Wt 189 lb (85.7 kg)    BMI 30.51 kg/m  Wt Readings from Last 3 Encounters:  11/02/21 189 lb (85.7 kg)  06/26/21 186 lb 12.8 oz (84.7 kg)  03/06/21 185 lb 10 oz (84.2 kg)   Constitutional: overweight, in NAD Eyes: PERRLA, EOMI, no exophthalmos ENT:  moist mucous membranes, no neck masses palpated, thyroidectomy scar healed, no cervical lymphadenopathy Cardiovascular: RRR, No MRG Respiratory: CTA B Musculoskeletal: no deformities, strength intact in all 4 Skin: moist, warm, no rashes Neurological: no tremor with outstretched hands, DTR normal in all 4  ASSESSMENT: 1. DM2, insulin-dependent, uncontrolled, with complications - CKD stage 3  2. Primary  HPTH  3. Vitamin D deficiency  4.  Follicular variant of papillary thyroid cancer  5.  Postsurgical hypothyroidism  PLAN:  1. Patient with uncontrolled type 2 diabetes, with improved HbA1c in 02/2021, at 6.4% on metformin and a premixed insulin regimen.  At last visit, HbA1c was higher, at 6.9%, but still at goal.  At that time, sugars are variable, and upon questioning, she was not taking the 70/30 insulin correctly.  If sugars were normal before a meal, she was not always taking the insulin.  We discussed about how to guide the dose of the insulin based on her blood sugars before the meal.  She also mentions that the 70/30 insulin pen was expensive.  I advised her to obtain insulin from [place], if needed, in the file for her.  I also advised her to inject the insulin 30 minutes before the meals, rather than 15 minutes before the meals that she was doing at last visit.  At this visit, however, she tells me that sugars are higher on Novolin and she switched back to NovoLog.  He would like me to refill the prescription.  Prescription sent. -At today's visit, sugars are quite fluctuating.  Upon questioning, she is not taking NovoLog before meals if blood sugars are normal at the time of the  check.  I advised her that she needs to take it even if blood sugars are at goal.  Moreover, she is not taking the second dose of insulin before dinner, as advised, but at that time, which is not conducive to good control.  I again strongly advised her to move the dose before meals - I suggested to:  Patient  Instructions  Please stop at the lab.  Please continue Levothyroxine 88 mcg daily.  Take the thyroid hormone every day, with water, at least 30 minutes before breakfast, separated by at least 4 hours from: - acid reflux medications - calcium - iron - multivitamins  Use Novolog 70/30: - 20-24 units 15 min before b'fast - 20-24 units 15 min before dinner  Please continue vitamin D 2000 units daily.  Please come back for a follow-up appointment in 3-4 months.  - we checked her HbA1c: 8.2% (higher) - advised to check sugars at different times of the day - 4x a day, rotating check times - advised for yearly eye exams >> she is not UTD - return to clinic in 4 months     2. Primary  HPTH and 3. vitamin D deficiency -s/p left superior parathyroidectomy with resection of hypercellular parathyroid focus -She started to feel much better after surgery -Before last visit, PTH was still high, at 98, and calcium was normal (03/2021) but at last visit, PTH was lower, at 80 and calcium was slightly high, at 10.5 (06/2021) -We will recheck these today -At last visit she was on 1000 units vitamin D daily and a multivitamin.  I advised her to stop the multivitamin so that she does not get extra calcium (220 mg), but to increase her vitamin D to 2000 units daily.  At that time, vitamin D level was normal. -She is now taking 2000 units vitamin D daily  4.  Follicular variant of papillary thyroid cancer -She has a history of multinodular goiter, with previous benign biopsies.  However, she had total thyroidectomy in 02/2021 and a 1.5 focus of follicular variant of PTC was found -We discussed about the fact that thyroid cancer is a slow-growing cancer with good prognosis.  We did not meet RAI ablation since her thyroid cancer focus was small and without high risk features -At last visit, thyroglobulin level was detectable, at 0.3, while ATA antibodies were undetectable.  A certain amount of variability in  her thyroglobulin levels is expected in the absence of RAI treatment. -We will recheck this at next visit  5.  Postsurgical hypothyroidism - latest thyroid labs reviewed with pt. >> normal: Lab Results  Component Value Date   TSH 1.56 06/26/2021  - she continues on LT4 88 mcg daily - pt feels good on this dose. - we discussed about taking the thyroid hormone every day, with water, >30 minutes before breakfast, separated by >4 hours from acid reflux medications, calcium, iron, multivitamins. Pt. is taking it correctly.  Component     Latest Ref Rng & Units 11/02/2021  VITD     30.00 - 100.00 ng/mL 33.29  PTH, Intact     15 - 65 pg/mL 49  Hemoglobin A1C     4.0 - 5.6 % 8.2 (A)  Calcium Ionized     4.8 - 5.6 mg/dL 5.69 (H)   Vitamin D normal, PTH much improved, but ionized calcium still slightly high.  For now, I would suggest to continue to follow these without intervention.  Philemon Kingdom, MD PhD [facility] Endocrinology

## 2021-11-03 LAB — PARATHYROID HORMONE, INTACT (NO CA): PTH: 49 pg/mL (ref 15–65)

## 2021-11-03 LAB — CALCIUM, IONIZED: Calcium, Ion: 5.69 mg/dL — ABNORMAL HIGH (ref 4.8–5.6)

## 2021-12-08 ENCOUNTER — Other Ambulatory Visit (HOSPITAL_COMMUNITY): Payer: Self-pay

## 2021-12-11 ENCOUNTER — Encounter (HOSPITAL_COMMUNITY)
Admission: RE | Admit: 2021-12-11 | Discharge: 2021-12-11 | Disposition: A | Discharge status: Home / Self Care | Payer: Medicare Other | Source: Ambulatory Visit | Attending: Nephrology | Admitting: Nephrology

## 2021-12-11 DIAGNOSIS — N189 Chronic kidney disease, unspecified: Secondary | ICD-10-CM | POA: Insufficient documentation

## 2021-12-11 DIAGNOSIS — D631 Anemia in chronic kidney disease: Secondary | ICD-10-CM | POA: Diagnosis present

## 2021-12-11 MED ORDER — SODIUM CHLORIDE 0.9 % IV SOLN
510.0000 mg | INTRAVENOUS | Status: DC
  Administered 2021-12-11: 510 mg via INTRAVENOUS
  Filled 2021-12-11: qty 510

## 2021-12-18 ENCOUNTER — Encounter (HOSPITAL_COMMUNITY)
Admission: RE | Admit: 2021-12-18 | Discharge: 2021-12-18 | Disposition: A | Discharge status: Home / Self Care | Payer: Medicare Other | Source: Ambulatory Visit | Attending: Nephrology | Admitting: Nephrology

## 2021-12-18 DIAGNOSIS — N189 Chronic kidney disease, unspecified: Secondary | ICD-10-CM | POA: Diagnosis not present

## 2021-12-18 MED ORDER — SODIUM CHLORIDE 0.9 % IV SOLN
510.0000 mg | INTRAVENOUS | Status: DC
  Administered 2021-12-18: 510 mg via INTRAVENOUS
  Filled 2021-12-18: qty 510

## 2021-12-29 ENCOUNTER — Other Ambulatory Visit: Payer: Self-pay | Admitting: Internal Medicine

## 2021-12-29 DIAGNOSIS — Z794 Long term (current) use of insulin: Secondary | ICD-10-CM

## 2022-03-01 ENCOUNTER — Ambulatory Visit: Payer: Medicare Other | Admitting: Endocrinology

## 2022-03-22 ENCOUNTER — Ambulatory Visit: Payer: Medicare Other | Admitting: Internal Medicine

## 2022-03-22 NOTE — Progress Notes (Deleted)
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  Last visit 5 months ago.  Interim history: She had parathyroid and thyroidectomy 02/2021 >> started to feel better after the surgery. No increased urination, blurry vision, nausea, chest pain.  Primary hyperparathyroidism She refused surgery initially.  FNA x3 in 12/2013: Benign  Technetium sestamibi parathyroid scan (08/2014) was nonlocalizing  Thyroid ultrasound (01/30/2021): Multinodular goiter with surveillance needed for right isthmic and right inferior nodules  Technetium sestamibi parathyroid scan (02/02/2021): Consistent with left superior parathyroid adenoma  FNA of the right mid thyroid nodule (02/22/2021): benign  Total thyroidectomy with left superior parathyroidectomy (03/06/2021): Pathology:  Left superior hypercellular parathyroid tissue with fibrosis Follicular variant of papillary thyroid cancer measuring 1.5 cm: A. PARATHYROID, LEFT SUPERIOR, PARATHYROIDECTOMY:  - Hypercellular parathyroid tissue associated with fibrosis.  - See comment.   B. THYROID, TOTAL, THYROIDECTOMY:  - Papillary thyroid carcinoma, follicular variant, 1.5 cm.  - Nodular hyperplasia (multinodular goiter).  - Margins not involved.  - See oncology table.   COMMENT:  A.  The parathyroid specimen shows irregular nests of hypercellular  parathyroid tissue with intervening bands of fibrous tissue.  There is  no necrosis, significant atypia or mitotic activity.   B. ONCOLOGY TABLE:  THYROID GLAND, CARCINOMA: Resection  Procedure: Thyroidectomy.  Tumor Focality: Unifocal.  Tumor Site: Right lobe.  Tumor Size: 1.5 cm, glass slide measurement.  Histologic Type:  Papillary thyroid carcinoma, follicular variant.  Angioinvasion: Not identified.  Lymphatic Invasion: Not identified.  Extrathyroidal Extension: Not identified.  Margin Status: All margins negative for carcinoma.       Regional Lymph Node Status: No lymph nodes submitted.  Pathologic Stage Classification (pTNM, AJCC 8th Edition): pT1b, pN not  assigned.  Representative Tumor Block: B1 and B2  Comment(s): The thyroid shows multiple hyperplastic nodules and in the  right lobe there is a 1.5 cm nodule with follicular architecture and  nuclear features of papillary thyroid carcinoma.   Currently on levothyroxine 88 mcg daily: - in am - fasting - at least 30 min from b'fast - no calcium - no iron - stopped multivitamins - no PPIs - not on Biotin  Lab Results  Component Value Date   TSH 1.56 06/26/2021   Reviewed pertinent labs: Lab Results  Component Value Date   CALCIUM 10.5 (H) 06/26/2021   PTH 49 11/02/2021  03/28/2021: PTH 98, calcium 10.2  08/18/2020: Calcium 11.2, albumin 4.3, magnesium 2.3 BUN/creatinine 35/1.53, GFR 38, glucose 153 hemoglobin 10.1  06/10/2018: Glucose 89, BUN/creatinine 21/1.19, EGFR 54 Calcium 11.8, corrected 11.7 (8.6-10.3)  Latest vitamin D level improved: Lab Results  Component Value Date   VD25OH 33.29 11/02/2021   VD25OH 38.21 06/26/2021   VD25OH 27.54 (L) 04/09/2017   VD25OH 30.17 04/09/2016   VD25OH 30.37 10/11/2015   VD25OH 48.75 03/29/2015   She was on 1000 units vitamin D daily and calcium 220 mg daily in MVI >> we stopped multivitamins and I advised her to start 2000 units vitamin D daily.  At last visit, she was not quite sure which dose she was taking...  24h Urine calcium was at the ULN.  She has osteopenia per review of her DXA scan report from 08/2018.  Pt denies: - feeling nodules in neck - hoarseness - dysphagia - choking  DM2:  Reviewed HbA1c levels: Lab Results  Component Value Date   HGBA1C 8.2 (A)  11/02/2021   HGBA1C 6.9 (A) 06/26/2021   HGBA1C 6.4 (H) 03/01/2021  08/06/2017: HbA1c 8.8%  Patient is  on: -  stopped before last visit, cannot remember when or why - Novolog 70/30 >> Novolin 70/30 >> back to Novolog 70/30: - 30 units before b'fast 20 units >> 15 >> 20 units >> 20-24 units before breakfast - 20 units before dinner 10 units >> 15 >> 20 units  - at bedtime >> 20-24 units before dinner Pt checks her sugars once a day: - 11-11:30 am: 74-140 >> 130-140, 170 >> 95-269 - before lunch: n/c >> 196 - 2h after lunch: n/c >> 155, 351 (pancakes) >> n/c - before dinner: n/c >> 185-190 - 2h after dinner: 153-204 >> 82, 90 >> 135-140 >> n/c - bedtime: n/c >> 96 >> n/c - nighttime: n/c >> occasionally 55 x3 >> n/c >> 130-140 >> n/c Lowest sugar was 40s >>... 48 >> 74 >> 95; she has hypoglycemia awareness in the 60s. Highest sugar was 351 >> 145 >> 400.  Glucometer: Accuchek Aviva  Pt's meals are: - Brunch: 7 layer salad, ham, [place]; cereal; egg + bacon + apple butter + OJ; fruit cocktail - Dinner: sandwich with meat; bowl of jello - Snacks: icecream, cookies, potato chips - after dinner, at 12 am-1 am  -+ Mild CKD, last BUN/creatinine:  Lab Results  Component Value Date   BUN 31 (H) 03/07/2021   BUN 21 03/01/2021   CREATININE 1.74 (H) 03/07/2021   CREATININE 1.29 (H) 03/01/2021  06/10/2018: Glucose 89, BUN/creatinine 21/1.19, EGFR 54 On Olmesartan 40.  -+ HL; lipids not available for review: No results found for: "CHOL", "HDL", "LDLCALC", "LDLDIRECT", "TRIG", "CHOLHDL"  On Pravastatin 8.  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  ROS: + see HPI  I reviewed pt's medications, allergies, PMH, social hx, family hx, and changes were documented in the history of present illness. Otherwise, unchanged from my initial visit  note.  Past Medical History:  Diagnosis Date   CKD (chronic kidney disease), stage III ([place])    Diabetes mellitus without complication ([place])    High cholesterol    Hypertension    Nontoxic goiter    Symptomatic anemia 02/04/2017   Past Surgical History:  Procedure Laterality Date   ABDOMINAL HYSTERECTOMY     COLONOSCOPY WITH PROPOFOL N/A 02/05/2017   Procedure: COLONOSCOPY WITH PROPOFOL;  Surgeon: Doran Stabler, MD;  Location: [facility];  Service: Endoscopy;  Laterality: N/A;   ESOPHAGOGASTRODUODENOSCOPY (EGD) WITH PROPOFOL N/A 02/05/2017   Procedure: ESOPHAGOGASTRODUODENOSCOPY (EGD) WITH PROPOFOL;  Surgeon: Doran Stabler, MD;  Location: [facility];  Service: Endoscopy;  Laterality: N/A;   PARATHYROIDECTOMY Left 03/06/2021   Procedure: LEFT SUPERIOR PARATHYROIDECTOMY;  Surgeon: Armandina Gemma, MD;  Location: [facility];  Service: General;  Laterality: Left;   REPLACEMENT TOTAL KNEE Right    THYROIDECTOMY N/A 03/06/2021   Procedure: TOTAL THYROIDECTOMY;  Surgeon: Armandina Gemma, MD;  Location: [facility];  Service: General;  Laterality: N/A;   Social History   Socioeconomic History   Marital status: Married    Spouse name: Not on file   Number of children: 1   Years of education: Not on file   Highest education level: Not on file  Occupational History   Occupation: retired  Tobacco Use   Smoking status: Never   Smokeless tobacco: Never  Vaping Use   Vaping Use: Never used  Substance and Sexual Activity   Alcohol use: Yes    Comment: wine occasional per pt.    Drug use: No    Types: Marijuana    Comment: Previously smoked. Quit in 1981   Sexual activity: Not on file  Other Topics Concern   Not on file  Social History Narrative   Not on file   Social Determinants of Health   Financial Resource Strain: Not on file  Food Insecurity: Not on file  Transportation Needs: Not on file  Physical Activity: Not on file  Stress: Not on file  Social Connections: Not on file   Intimate Partner Violence: Not on file   Current Outpatient Medications on File Prior to Visit  Medication Sig Dispense Refill   albuterol (VENTOLIN HFA) 108 (90 Base) MCG/ACT inhaler Inhale 2 puffs into the lungs every 6 (six) hours as needed for wheezing or shortness of breath.     aspirin EC 81 MG tablet Take 81 mg by mouth daily.     Aspirin-Acetaminophen-Caffeine (GOODY HEADACHE PO) Take 1 packet by mouth daily as needed (pain/headache).     BD PEN NEEDLE NANO 2ND GEN 32G X 4 MM MISC 1 EACH BY DOES NOT APPLY ROUTE IN THE MORNING AND AT BEDTIME. USE WITH INSULIN PEN 2 TIMES A DAY.DUE FOR APPOINTMENT 200 each 3   budesonide-formoterol (SYMBICORT) 160-4.5 MCG/ACT inhaler Inhale 2 puffs into the lungs 2 (two) times daily as needed (wheezing or shortness of breath).     chlorthalidone (HYGROTON) 25 MG tablet Take 25 mg by mouth daily. 1 tablet three times a week     Cholecalciferol (VITAMIN D) 50 MCG (2000 UT) tablet Take 2,000 Units by mouth daily.     COVID-19 mRNA bivalent vaccine, Pfizer, (PFIZER COVID-19 VAC BIVALENT) injection Inject into the muscle. 0.3 mL 0   Cyanocobalamin 1500 MCG TBDP Take 1,500 mcg by mouth daily.     ELDERBERRY PO Take 100 mg by mouth daily.     glucose blood (ACCU-CHEK AVIVA PLUS) test strip USE AS INSTRUCTED 3-4 TIMES A DAY 400 strip 3   hydrALAZINE (APRESOLINE) 100 MG tablet Take 100 mg by mouth 3 (three) times daily.     insulin aspart protamine - aspart (NOVOLOG MIX 70/30 FLEXPEN) (70-30) 100 UNIT/ML FlexPen Inject 20-24 units under skin 2x a day before meals 30 mL 3   insulin NPH-regular Human (NOVOLIN 70/30 RELION) (70-30) 100 UNIT/ML injection Inject 15-20 Units into the skin 2 (two) times daily before a meal. (Patient not taking: Reported on 11/02/2021) 20 mL 11   Insulin Syringe-Needle U-100 (B-D INSULIN SYRINGE) 31G X 5/16" 0.3 ML MISC Use 2-3x a day 200 each 3   lactulose (CHRONULAC) 10 GM/15ML solution Take 15-30 cc daily as needed for bowels (Patient  taking differently: Take 10-20 g by mouth daily as needed for moderate constipation.) 236 mL 11   latanoprost (XALATAN) 0.005 % ophthalmic solution Place 1 drop into both eyes at bedtime.     levothyroxine (SYNTHROID) 88 MCG tablet TAKE 1 TABLET BY MOUTH DAILY BEFORE BREAKFAST. 90 tablet 1   metFORMIN (GLUCOPHAGE) 500 MG tablet TAKE 1 TABLET BY MOUTH TWICE A DAY 60 tablet 2   metoprolol tartrate (LOPRESSOR) 100 MG tablet Take 100 mg by mouth 2 (two) times daily.     Multiple Vitamins-Minerals (CENTRUM SILVER  ADULT 50+) TABS Take 1 tablet by mouth daily.     olmesartan (BENICAR) 40 MG tablet Take 40 mg by mouth daily.     pravastatin (PRAVACHOL) 80 MG tablet Take 80 mg by mouth daily.  5   Propylene Glycol 0.6 % SOLN Place 1 drop into both eyes daily as needed (swelling or redness).     traMADol (ULTRAM) 50 MG tablet Take 1-2 tablets (50-100 mg total) by mouth every 6 (six) hours as needed for moderate pain. 15 tablet 0   No current facility-administered medications on file prior to visit.   Allergies  Allergen Reactions   Ace Inhibitors Cough   Family History  Problem Relation Age of Onset   Stroke Mother    Cancer Father        pts states cancer in back   Lung cancer Sister    Heart disease Sister    Cancer Brother        "   Colon cancer Neg Hx     PE: There were no vitals taken for this visit. Wt Readings from Last 3 Encounters:  12/11/21 183 lb (83 kg)  11/02/21 189 lb (85.7 kg)  06/26/21 186 lb 12.8 oz (84.7 kg)   Constitutional: overweight, in NAD Eyes: PERRLA, EOMI, no exophthalmos ENT: moist mucous membranes, no neck masses palpated, thyroidectomy scar healed, no cervical lymphadenopathy Cardiovascular: RRR, No MRG Respiratory: CTA B Musculoskeletal: no deformities, strength intact in all 4 Skin: moist, warm, no rashes Neurological: no tremor with outstretched hands, DTR normal in all 4  ASSESSMENT: 1. DM2, insulin-dependent, uncontrolled, with complications -  CKD stage 3  2. Primary  HPTH  3. Vitamin D deficiency  4.  Follicular variant of papillary thyroid cancer  5.  Postsurgical hypothyroidism  PLAN:  1. Patient with uncontrolled type 2 diabetes, with improved HbA1c in 02/2021, at 6.4% on metformin and a premixed insulin regimen.  At last visit, HbA1c was higher, at 6.9%, but still at goal.  At that time, sugars are variable, and upon questioning, she was not taking the 70/30 insulin correctly.  If sugars were normal before a meal, she was not always taking the insulin.  We discussed about how to guide the dose of the insulin based on her blood sugars before the meal.  She also mentions that the 70/30 insulin pen was expensive.  I advised her to obtain insulin from [facility], if needed, in the file for her.  I also advised her to inject the insulin 30 minutes before the meals, rather than 15 minutes before the meals that she was doing at last visit.  At this visit, however, she tells me that sugars are higher on Novolin and she switched back to NovoLog.  He would like me to refill the prescription.  Prescription sent. -At today's visit, sugars are quite fluctuating.  Upon questioning, she is not taking NovoLog before meals if blood sugars are normal at the time of the check.  I advised her that she needs to take it even if blood sugars are at goal.  Moreover, she is not taking the second dose of insulin before dinner, as advised, but at that time, which is not conducive to good control.  I again strongly advised her to move the dose before meals  - I suggested to:  Patient Instructions  Please stop at the lab.  Please continue Levothyroxine 88 mcg daily.  Take the thyroid hormone every day, with water, at least 30 minutes before breakfast,  separated by at least 4 hours from: - acid reflux medications - calcium - iron - multivitamins  Use Novolog 70/30: - 20-24 units 15 min before b'fast - 20-24 units 15 min before dinner  Please continue  vitamin D 2000 units daily.  Please come back for a follow-up appointment in 3-4 months.  - we checked her HbA1c: 7%  - advised to check sugars at different times of the day - 2x a day, rotating check times - advised for yearly eye exams >> she is UTD - return to clinic in 3-4 months     2. Primary  HPTH and 3. vitamin D deficiency -s/p left superior parathyroidectomy with resection of hypercellular parathyroid focus -She started to feel much better after surgery -Before last visit, PTH was still high, at 98, and calcium was normal (03/2021) but at last visit, PTH was lower, at 80 and calcium was slightly high, at 10.5 (06/2021) -An ionized calcium was slightly high at last visit, at 5.69 (4.8-5.6) -We will continue to follow her expectantly for this-we will recheck a calcium level today -At last visit, vitamin D level was normal, at 33.29.  She was not sure whether she was taking 1000 or 2000 units vitamin D daily.  4.  Follicular variant of papillary thyroid cancer -She has a history of multinodular goiter, with previous benign biopsies.  However, she had total thyroidectomy in 02/2021 and a 1.5 focus of follicular variant of PTC was found -We discussed about the fact that thyroid cancer is a slow-growing cancer with good prognosis.  We did not meet RAI ablation since her thyroid cancer focus was small and without high risk features -At last visit, thyroglobulin level was detectable, at 0.3, while ATA antibodies were undetectable.  We discussed that a certain amount of variability in her thyroglobulin levels it was expected in the absence of RAI treatment -She has no masses felt on her neck today -We will recheck these again today  5.  Postsurgical hypothyroidism - latest thyroid labs reviewed with pt. >> normal: Lab Results  Component Value Date   TSH 1.56 06/26/2021  - she continues on LT4 88 mcg daily - pt feels good on this dose. - we discussed about taking the thyroid hormone  every day, with water, >30 minutes before breakfast, separated by >4 hours from acid reflux medications, calcium, iron, multivitamins. Pt. is taking it correctly. - will check thyroid tests today: TSH and fT4 - If labs are abnormal, she will need to return for repeat TFTs in 1.5 months   Component     Latest Ref Rng & Units 11/02/2021  VITD     30.00 - 100.00 ng/mL 33.29  PTH, Intact     15 - 65 pg/mL 49  Hemoglobin A1C     4.0 - 5.6 % 8.2 (A)  Calcium Ionized     4.8 - 5.6 mg/dL 5.69 (H)   Vitamin D normal, PTH much improved, but ionized calcium still slightly high.  For now, I would suggest to continue to follow these without intervention.  Philemon Kingdom, MD PhD [facility]

## 2022-03-25 ENCOUNTER — Other Ambulatory Visit: Payer: Self-pay | Admitting: Internal Medicine

## 2022-06-25 ENCOUNTER — Other Ambulatory Visit: Payer: Self-pay | Admitting: Internal Medicine

## 2022-06-28 ENCOUNTER — Encounter: Payer: Self-pay | Admitting: Internal Medicine

## 2022-06-28 ENCOUNTER — Ambulatory Visit (INDEPENDENT_AMBULATORY_CARE_PROVIDER_SITE_OTHER): Payer: Medicare Other | Admitting: Internal Medicine

## 2022-06-28 VITALS — BP 128/68 | Ht 66.0 in | Wt 191.6 lb

## 2022-06-28 DIAGNOSIS — E1121 Type 2 diabetes mellitus with diabetic nephropathy: Secondary | ICD-10-CM | POA: Diagnosis not present

## 2022-06-28 DIAGNOSIS — C73 Malignant neoplasm of thyroid gland: Secondary | ICD-10-CM

## 2022-06-28 DIAGNOSIS — E213 Hyperparathyroidism, unspecified: Secondary | ICD-10-CM

## 2022-06-28 DIAGNOSIS — E890 Postprocedural hypothyroidism: Secondary | ICD-10-CM

## 2022-06-28 DIAGNOSIS — E559 Vitamin D deficiency, unspecified: Secondary | ICD-10-CM | POA: Diagnosis not present

## 2022-06-28 DIAGNOSIS — Z794 Long term (current) use of insulin: Secondary | ICD-10-CM | POA: Diagnosis not present

## 2022-06-28 LAB — POCT GLUCOSE (DEVICE FOR HOME USE): POC Glucose: 147 mg/dl — AB (ref 70–99)

## 2022-06-28 LAB — POCT GLYCOSYLATED HEMOGLOBIN (HGB A1C): Hemoglobin A1C: 7.1 % — AB (ref 4.0–5.6)

## 2022-06-28 MED ORDER — ACCU-CHEK GUIDE ME W/DEVICE KIT: PACK | 0 refills | Status: AC

## 2022-06-28 NOTE — Progress Notes (Signed)
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  Last visit 8 months ago.  Interim history: She initially refused parathyroid surgery, but eventually she had parathyroid and thyroidectomy 02/2021 >> started to feel better after the surgery. No increased urination, blurry vision, nausea, abdominal pain, constipation. She has no complaints at this visit.  Primary hyperparathyroidism  FNA x3 in 12/2013: Benign  Technetium sestamibi parathyroid scan (08/2014) was nonlocalizing  Thyroid ultrasound (01/30/2021): Multinodular goiter with surveillance needed for right isthmic and right inferior nodules  Technetium sestamibi parathyroid scan (02/02/2021): Consistent with left superior parathyroid adenoma  FNA of the right mid thyroid nodule (02/22/2021): benign  Total thyroidectomy with left superior parathyroidectomy (03/06/2021): Pathology:  Left superior hypercellular parathyroid tissue with fibrosis Follicular variant of papillary thyroid cancer measuring 1.5 cm: A. PARATHYROID, LEFT SUPERIOR, PARATHYROIDECTOMY:  - Hypercellular parathyroid tissue associated with fibrosis.  - See comment.   B. THYROID, TOTAL, THYROIDECTOMY:  - Papillary thyroid carcinoma, follicular variant, 1.5 cm.  - Nodular hyperplasia (multinodular goiter).  - Margins not involved.  - See oncology table.   COMMENT:  A.  The parathyroid specimen shows irregular nests of hypercellular  parathyroid tissue with intervening bands of fibrous tissue.  There is  no necrosis, significant atypia or mitotic activity.   B. ONCOLOGY TABLE:  THYROID GLAND, CARCINOMA: Resection  Procedure: Thyroidectomy.  Tumor Focality: Unifocal.  Tumor Site: Right  lobe.  Tumor Size: 1.5 cm, glass slide measurement.  Histologic Type: Papillary thyroid carcinoma, follicular variant.  Angioinvasion: Not identified.  Lymphatic Invasion: Not identified.  Extrathyroidal Extension: Not identified.  Margin Status: All margins negative for carcinoma.       Regional Lymph Node Status: No lymph nodes submitted.  Pathologic Stage Classification (pTNM, AJCC 8th Edition): pT1b, pN not  assigned.  Representative Tumor Block: B1 and B2  Comment(s): The thyroid shows multiple hyperplastic nodules and in the  right lobe there is a 1.5 cm nodule with follicular architecture and  nuclear features of papillary thyroid carcinoma.   She is on levothyroxine 88 mcg daily: - in am - fasting - at least 30 min from b'fast - no calcium - no iron - stopped multivitamins - no PPIs - not on Biotin  Lab Results  Component Value Date   TSH 1.56 06/26/2021   Reviewed pertinent labs: 11/02/2021: iCa 5.69 (4.8-5.6) Lab Results  Component Value Date   CALCIUM 10.5 (H) 06/26/2021   PTH 49 11/02/2021  03/28/2021: PTH 98, calcium 10.2  08/18/2020: Calcium 11.2, albumin 4.3, magnesium 2.3 BUN/creatinine 35/1.53, GFR 38, glucose 153 hemoglobin 10.1  06/10/2018: Glucose 89, BUN/creatinine 21/1.19, EGFR 54 Calcium 11.8, corrected 11.7 (8.6-10.3)  Latest vitamin D level was low: Lab Results  Component Value Date   VD25OH 33.29 11/02/2021   She was on 1000 units vitamin D daily and calcium 220 mg daily in MVI >> we stopped multivitamins and I advised her to start 2000 units vitamin D daily.  She does not remember if she takes this...  24h Urine calcium was at the ULN.  She has osteopenia per review of her DXA scan report from 08/2018.  Pt denies: - feeling nodules in neck - hoarseness - dysphagia - choking  DM2:  Reviewed HbA1c levels: Lab Results  Component Value Date   HGBA1C 8.2 (  A) 11/02/2021   HGBA1C 6.9 (A) 06/26/2021   HGBA1C 6.4 (H) 03/01/2021   08/06/2017: HbA1c 8.8%  Patient is  on: -  stopped before last visit, could not remember when or why - Novolog 70/30:>> ran out  >> Novolin 70/30 >> back on Novolog 70/30: - 30 units before b'fast ... >> 20 units >> 20-24 units 15 min before b'fast - 20 units before dinner ... >> 20 units  - at bedtime >> 20-24 units 15 min before dinner  Pt checks her sugars once a day: - 11-11:30 am:  74-140 >> 130-140, 170 >> 95-269 >> 140-249 - before lunch: n/c >> 196 >> n/c - 2h after lunch: n/c >> 155, 351 (pancakes) >> n/c - before dinner: n/c >> 185-190 >> n/c - 2h after dinner: 150s >> 153-204 >> 82, 90 >> 135-140 >> n/c  - bedtime: n/c >> 96 >> n/c - nighttime:  occasionally 55 x3 >> n/c >> 130-140 >> n/c Lowest sugar was 40s >>... 48 >> 74 >> 95 >> 100; she has hypoglycemia awareness in the 60s. Highest sugar was 351 >> 145 >> 400 >> 249.  Glucometer: Accuchek Aviva  Pt's meals are: - Brunch: 7 layer salad, ham, [place]; cereal; egg + bacon + apple butter + OJ; fruit cocktail - Dinner: sandwich with meat; bowl of jello - Snacks: icecream, cookies, potato chips - after dinner, at 12 am-1 am  -+ Mild CKD, last BUN/creatinine:  Lab Results  Component Value Date   BUN 31 (H) 03/07/2021   BUN 21 03/01/2021   CREATININE 1.74 (H) 03/07/2021   CREATININE 1.29 (H) 03/01/2021  06/10/2018: Glucose 89, BUN/creatinine 21/1.19, EGFR 54 On Olmesartan 40.  -+ HL; lipids not available for review: No results found for: "CHOL", "HDL", "LDLCALC", "LDLDIRECT", "TRIG", "CHOLHDL"  On Pravastatin 8.  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  ROS: + see HPI  I reviewed pt's medications, allergies, PMH, social hx, family hx, and changes were documented in the history of present illness. Otherwise, unchanged from my initial visit note.  Past Medical History:   Diagnosis Date   CKD (chronic kidney disease), stage III ([place])    Diabetes mellitus without complication ([place])    High cholesterol    Hypertension    Nontoxic goiter    Symptomatic anemia 02/04/2017   Past Surgical History:  Procedure Laterality Date   ABDOMINAL HYSTERECTOMY     COLONOSCOPY WITH PROPOFOL N/A 02/05/2017   Procedure: COLONOSCOPY WITH PROPOFOL;  Surgeon: Doran Stabler, MD;  Location: [facility];  Service: Endoscopy;  Laterality: N/A;   ESOPHAGOGASTRODUODENOSCOPY (EGD) WITH PROPOFOL N/A 02/05/2017   Procedure: ESOPHAGOGASTRODUODENOSCOPY (EGD) WITH PROPOFOL;  Surgeon: Doran Stabler, MD;  Location: [facility];  Service: Endoscopy;  Laterality: N/A;   PARATHYROIDECTOMY Left 03/06/2021   Procedure: LEFT SUPERIOR PARATHYROIDECTOMY;  Surgeon: Armandina Gemma, MD;  Location: [facility];  Service: General;  Laterality: Left;   REPLACEMENT TOTAL KNEE Right    THYROIDECTOMY N/A 03/06/2021   Procedure: TOTAL THYROIDECTOMY;  Surgeon: Armandina Gemma, MD;  Location: [facility];  Service: General;  Laterality: N/A;   Social History   Socioeconomic History   Marital status: Married    Spouse name: Not on file   Number of children: 1   Years of education: Not on file   Highest education level: Not on  file  Occupational History   Occupation: retired  Tobacco Use   Smoking status: Never   Smokeless tobacco: Never  Vaping Use   Vaping Use: Never used  Substance and Sexual Activity   Alcohol use: Yes    Comment: wine occasional per pt.    Drug use: No    Types: Marijuana    Comment: Previously smoked. Quit in 1981   Sexual activity: Not on file  Other Topics Concern   Not on file  Social History Narrative   Not on file   Social Determinants of Health   Financial Resource Strain: Not on file  Food Insecurity: Not on file  Transportation Needs: Not on file  Physical Activity: Not on file  Stress: Not on file  Social Connections: Not on file  Intimate Partner Violence: Not  on file   Current Outpatient Medications on File Prior to Visit  Medication Sig Dispense Refill   albuterol (VENTOLIN HFA) 108 (90 Base) MCG/ACT inhaler Inhale 2 puffs into the lungs every 6 (six) hours as needed for wheezing or shortness of breath.     aspirin EC 81 MG tablet Take 81 mg by mouth daily.     Aspirin-Acetaminophen-Caffeine (GOODY HEADACHE PO) Take 1 packet by mouth daily as needed (pain/headache).     BD PEN NEEDLE NANO 2ND GEN 32G X 4 MM MISC 1 EACH BY DOES NOT APPLY ROUTE IN THE MORNING AND AT BEDTIME. USE WITH INSULIN PEN 2 TIMES A DAY.DUE FOR APPOINTMENT 200 each 3   budesonide-formoterol (SYMBICORT) 160-4.5 MCG/ACT inhaler Inhale 2 puffs into the lungs 2 (two) times daily as needed (wheezing or shortness of breath).     chlorthalidone (HYGROTON) 25 MG tablet Take 25 mg by mouth daily. 1 tablet three times a week     Cholecalciferol (VITAMIN D) 50 MCG (2000 UT) tablet Take 2,000 Units by mouth daily.     COVID-19 mRNA bivalent vaccine, Pfizer, (PFIZER COVID-19 VAC BIVALENT) injection Inject into the muscle. 0.3 mL 0   Cyanocobalamin 1500 MCG TBDP Take 1,500 mcg by mouth daily.     ELDERBERRY PO Take 100 mg by mouth daily.     glucose blood (ACCU-CHEK AVIVA PLUS) test strip USE AS INSTRUCTED 3-4 TIMES A DAY 400 strip 3   hydrALAZINE (APRESOLINE) 100 MG tablet Take 100 mg by mouth 3 (three) times daily.     insulin aspart protamine - aspart (NOVOLOG MIX 70/30 FLEXPEN) (70-30) 100 UNIT/ML FlexPen Inject 20-24 units under skin 2x a day before meals 30 mL 3   insulin NPH-regular Human (NOVOLIN 70/30 RELION) (70-30) 100 UNIT/ML injection Inject 15-20 Units into the skin 2 (two) times daily before a meal. (Patient not taking: Reported on 11/02/2021) 20 mL 11   Insulin Syringe-Needle U-100 (B-D INSULIN SYRINGE) 31G X 5/16" 0.3 ML MISC Use 2-3x a day 200 each 3   lactulose (CHRONULAC) 10 GM/15ML solution Take 15-30 cc daily as needed for bowels (Patient taking differently: Take 10-20 g by  mouth daily as needed for moderate constipation.) 236 mL 11   latanoprost (XALATAN) 0.005 % ophthalmic solution Place 1 drop into both eyes at bedtime.     levothyroxine (SYNTHROID) 88 MCG tablet TAKE 1 TABLET BY MOUTH EVERY DAY BEFORE BREAKFAST 90 tablet 0   metFORMIN (GLUCOPHAGE) 500 MG tablet TAKE 1 TABLET BY MOUTH TWICE A DAY 60 tablet 2   metoprolol tartrate (LOPRESSOR) 100 MG tablet Take 100 mg by mouth 2 (two) times daily.     Multiple  Vitamins-Minerals (CENTRUM SILVER ADULT 50+) TABS Take 1 tablet by mouth daily.     olmesartan (BENICAR) 40 MG tablet Take 40 mg by mouth daily.     pravastatin (PRAVACHOL) 80 MG tablet Take 80 mg by mouth daily.  5   Propylene Glycol 0.6 % SOLN Place 1 drop into both eyes daily as needed (swelling or redness).     traMADol (ULTRAM) 50 MG tablet Take 1-2 tablets (50-100 mg total) by mouth every 6 (six) hours as needed for moderate pain. 15 tablet 0   No current facility-administered medications on file prior to visit.   Allergies  Allergen Reactions   Ace Inhibitors Cough   Family History  Problem Relation Age of Onset   Stroke Mother    Cancer Father        pts states cancer in back   Lung cancer Sister    Heart disease Sister    Cancer Brother        "   Colon cancer Neg Hx     PE: BP 128/68 (BP Location: Right Arm, Patient Position: Sitting, Cuff Size: Normal)   Ht 5' 6" (1.676 m)   Wt 191 lb 9.6 oz (86.9 kg)   SpO2 94%   BMI 30.93 kg/m  Wt Readings from Last 3 Encounters:  06/28/22 191 lb 9.6 oz (86.9 kg)  12/11/21 183 lb (83 kg)  11/02/21 189 lb (85.7 kg)   Constitutional: overweight, in NAD Eyes:  EOMI, no exophthalmos ENT: no neck masses, no cervical lymphadenopathy Cardiovascular: RRR, No MRG Respiratory: CTA B Musculoskeletal: no deformities Skin:no rashes Neurological: no tremor with outstretched hands Diabetic Foot Exam - Simple   Simple Foot Form Diabetic Foot exam was performed with the following findings: Yes  06/28/2022  4:14 PM  Visual Inspection No deformities, no ulcerations, no other skin breakdown bilaterally: Yes Sensation Testing Intact to touch and monofilament testing bilaterally: Yes Pulse Check Posterior Tibialis and Dorsalis pulse intact bilaterally: Yes Comments Thick nails     ASSESSMENT: 1. DM2, insulin-dependent, uncontrolled, with complications - CKD stage 3  2. Primary  HPTH  3. Vitamin D deficiency  4.  Follicular variant of papillary thyroid cancer  5.  Postsurgical hypothyroidism  PLAN:  1. Patient with uncontrolled type 2 diabetes, with improved diabetes control in 02/2021, on metformin and premixed insulin regimen.  At that time, HbA1c was 6.4%.  Afterwards, sugars started to worsen and at last visit, HbA1c was 8.2%.  At that time, sugars were quite fluctuating, but upon questioning, she was not taking NovoLog before meals if blood sugars were normal at the time of the check.  I advised her that she needed to take it even if the blood sugars were at goal.  Moreover, she was not taking the second dose of insulin before dinner, as advised, but at bedtime, which is not conducive to good control.  I again strongly advised her to move the doses before meals.  I also advised her to vary the dose of premixed insulin based on the size and consistency of her meals. -At this visit, with that sugars are higher than expected from the HbA1c (see below).  She believes that her meter is inaccurate.  She did change her battery a month ago.  At this visit, I called in a prescription for another meter to her pharmacy.  Also, we checked her blood sugar in the office and this was 145.  She mentions that her blood sugars at home were in the  200s but she took the insulin afterwards.  She also reports eating candy corn and other sweets -strongly advised her to stop.  I also advised her to try checking blood sugars at different times of the day, rotating check times.  Ideally, she will check at  least twice a day. -Due to the improvement in the HbA1c, for now, I advised her to continue the current regimen. - I suggested to:  Patient Instructions  Please stop at the lab.  Please continue Levothyroxine 88 mcg daily.  Take the thyroid hormone every day, with water, at least 30 minutes before breakfast, separated by at least 4 hours from: - acid reflux medications - calcium - iron - multivitamins  Continue Novolog 70/30: - 20-24 units 15 min before b'fast - 20-24 units 15 min before dinner  Please restart vitamin D 2000 units daily.  Please come back for a follow-up appointment in 4 months.  - we checked her HbA1c: 7.1% (improved) - advised to check sugars at different times of the day - 2-3x a day, rotating check times - advised for yearly eye exams >> she is not UTD - return to clinic in 3-4 months    2. Primary  HPTH and 3. vitamin D deficiency -s/p left superior parathyroidectomy with resection of hypercellular parathyroid focus -She started to feel much better after surgery -at last check, ionized calcium was slightly high, we we will repeat this today -prev. on 1000 units vitamin D daily and a multivitamin.  I advised her to stop the multivitamin so that she does not get extra calcium (220 mg), but to increase her vitamin D to 2000 units daily.  At that time, vitamin D level was normal. -she was on 2000 units vitamin D daily, but apparently stopped -she does not take this and cannot remember why... -We will recheck a vitamin D level today  4.  Follicular variant of papillary thyroid cancer -She has a history of multinodular goiter, with previous benign biopsies.  However, she had total thyroidectomy in 02/2021 and a 1.5 cm focus of follicular variant of PTC was found -We discussed that thyroid cancer is a slow-growing cancer with good prognosis.  We did not proceed with RAI ablation since her thyroid cancer focus was small and without high risk features. -At last  check, thyroglobulin level was detectable, at 0.3, while ATA antibodies were undetectable.  In the absence of the RAI treatment, a certain amount variability in her thyroglobulin levels as expected -will recheck the thyroglobulin and ATA today -We also need to check a follow-up ultrasound now  5.  Postsurgical hypothyroidism - latest thyroid labs reviewed with pt. >> normal: Lab Results  Component Value Date   TSH 1.56 06/26/2021  - she continues on LT4 88 mcg daily - pt feels good on this dose. - we discussed about taking the thyroid hormone every day, with water, >30 minutes before breakfast, separated by >4 hours from acid reflux medications, calcium, iron, multivitamins. Pt. is taking it correctly. - will check thyroid tests today: TSH and fT4 - If labs are abnormal, she will need to return for repeat TFTs in 1.5 months  Orders Placed This Encounter  Procedures   US THYROID   TSH   T4, free   Thyroglobulin antibody   Thyroglobulin Level   VITAMIN D 25 Hydroxy (Vit-D Deficiency, Fractures)   Calcium, ionized   Lipid panel   Comprehensive metabolic panel   Microalbumin / creatinine urine ratio   POCT Glucose (Device  for Home Use)   Component     Latest Ref Rng 06/28/2022  Sodium     135 - 145 mEq/L 139   Potassium     3.5 - 5.1 mEq/L 4.4   Chloride     96 - 112 mEq/L 104   CO2     19 - 32 mEq/L 26   Glucose     70 - 99 mg/dL 118 (H)   BUN     6 - 23 mg/dL 26 (H)   Creatinine     0.40 - 1.20 mg/dL 1.84 (H)   Calcium     8.4 - 10.5 mg/dL 10.8 (H)   Total Protein     6.0 - 8.3 g/dL 7.2   Albumin     3.5 - 5.2 g/dL 4.3   AST     0 - 37 U/L 17   ALT     0 - 35 U/L 11   Alkaline Phosphatase     39 - 117 U/L 65   Total Bilirubin     0.2 - 1.2 mg/dL 0.3   TSH     0.35 - 5.50 uIU/mL 5.29   T4,Free(Direct)     0.60 - 1.60 ng/dL 1.06   VITD     30.00 - 100.00 ng/mL 32.54   Thyroglobulin Ab     < or = 1 IU/mL <1   Thyroglobulin     ng/mL 0.4 (L)   Comment --    Calcium Ionized     4.7 - 5.5 mg/dL 5.5   Cholesterol     0 - 200 mg/dL 134   Triglycerides     0.0 - 149.0 mg/dL 122.0   HDL Cholesterol     >39.00 mg/dL 30.10 (L)   VLDL     0.0 - 40.0 mg/dL 24.4   LDL (calc)     0 - 99 mg/dL 79   Total CHOL/HDL Ratio 4   NonHDL 103.47   GFR     >60.00 mL/min 26.15 (L)   Microalb, Ur     0.0 - 1.9 mg/dL 0.9   Creatinine,U     mg/dL 171.8   MICROALB/CREAT RATIO     0.0 - 30.0 mg/g 0.5    Ionized calcium is at the upper limit of the target range.  This could be related to declining kidney function.  For now, I will advise him to stay well-hydrated. Vitamin D level is normal, but low in the target range.  I would still recommend to take a vitamin D supplement. Urine proteins are normal. TSH is slightly high.  I will advise her to increase the dose of levothyroxine to 100 mcg daily and return for labs in 1.5 months. Thyroglobulin is very slightly higher, probably due to the higher TSH. LDL is slightly higher than goal, HDL low.  Triglycerides at goal. Kidney function is slightly lower.  I will check with the patient if she is seeing nephrology.  If not, I would recommend this.   Philemon Kingdom, MD PhD [facility]

## 2022-06-28 NOTE — Patient Instructions (Signed)
Please stop at the lab.  Please continue Levothyroxine 88 mcg daily.  Take the thyroid hormone every day, with water, at least 30 minutes before breakfast, separated by at least 4 hours from: - acid reflux medications - calcium - iron - multivitamins  Continue Novolog 70/30: - 20-24 units 15 min before b'fast - 20-24 units 15 min before dinner  Please restart vitamin D 2000 units daily.  Please come back for a follow-up appointment in 4 months.

## 2022-06-29 LAB — COMPREHENSIVE METABOLIC PANEL
ALT: 11 U/L (ref 0–35)
AST: 17 U/L (ref 0–37)
Albumin: 4.3 g/dL (ref 3.5–5.2)
Alkaline Phosphatase: 65 U/L (ref 39–117)
BUN: 26 mg/dL — ABNORMAL HIGH (ref 6–23)
CO2: 26 mEq/L (ref 19–32)
Calcium: 10.8 mg/dL — ABNORMAL HIGH (ref 8.4–10.5)
Chloride: 104 mEq/L (ref 96–112)
Creatinine, Ser: 1.84 mg/dL — ABNORMAL HIGH (ref 0.40–1.20)
GFR: 26.15 mL/min — ABNORMAL LOW (ref >60.00)
Glucose, Bld: 118 mg/dL — ABNORMAL HIGH (ref 70–99)
Potassium: 4.4 mEq/L (ref 3.5–5.1)
Sodium: 139 mEq/L (ref 135–145)
Total Bilirubin: 0.3 mg/dL (ref 0.2–1.2)
Total Protein: 7.2 g/dL (ref 6.0–8.3)

## 2022-06-29 LAB — TSH: TSH: 5.29 u[IU]/mL (ref 0.35–5.50)

## 2022-06-29 LAB — LIPID PANEL
Cholesterol: 134 mg/dL (ref 0–200)
HDL: 30.1 mg/dL — ABNORMAL LOW (ref >39.00)
LDL Cholesterol: 79 mg/dL (ref 0–99)
NonHDL: 103.47
Total CHOL/HDL Ratio: 4
Triglycerides: 122 mg/dL (ref 0.0–149.0)
VLDL: 24.4 mg/dL (ref 0.0–40.0)

## 2022-06-29 LAB — MICROALBUMIN / CREATININE URINE RATIO
Creatinine,U: 171.8 mg/dL
Microalb Creat Ratio: 0.5 mg/g (ref 0.0–30.0)
Microalb, Ur: 0.9 mg/dL (ref 0.0–1.9)

## 2022-06-29 LAB — THYROGLOBULIN ANTIBODY: Thyroglobulin Ab: <1 IU/mL (ref <=1)

## 2022-06-29 LAB — T4, FREE: Free T4: 1.06 ng/dL (ref 0.60–1.60)

## 2022-06-29 LAB — THYROGLOBULIN LEVEL: Thyroglobulin: 0.4 ng/mL — ABNORMAL LOW

## 2022-06-29 LAB — VITAMIN D 25 HYDROXY (VIT D DEFICIENCY, FRACTURES): VITD: 32.54 ng/mL (ref 30.00–100.00)

## 2022-06-29 LAB — CALCIUM, IONIZED: Calcium, Ion: 5.5 mg/dL (ref 4.7–5.5)

## 2022-06-29 MED ORDER — LEVOTHYROXINE SODIUM 100 MCG PO TABS: 100.0000 ug | ORAL_TABLET | Freq: Every day | ORAL | 3 refills | Status: DC

## 2022-07-02 ENCOUNTER — Telehealth: Payer: Self-pay

## 2022-07-02 NOTE — Telephone Encounter (Addendum)
Lvm for pt to contact office. ----- Message from Philemon Kingdom, MD sent at 06/29/2022  4:48 PM EDT ----- Can you please call pt.:  Ionized calcium is at the upper limit of the target range.  Please advise her to stay well-hydrated. Vitamin D level is normal, but low in the target range.  I would still recommend to take a vitamin D supplement, as discussed. Urine proteins are normal. TSH is slightly high. Please advise her to increase the dose of levothyroxine to 100 mcg daily and return for labs in 1.5 months.  I sent the prescription and ordered the labs. Thyroglobulin is very slightly higher, probably due to the higher TSH. LDL cholesterol is slightly higher than goal, HDL low.  Triglycerides at goal. Kidney function is slightly lower.  Please check with the patient if she is seeing nephrology.  If not, I would recommend this.

## 2022-08-15 ENCOUNTER — Other Ambulatory Visit: Payer: Medicare Other

## 2022-08-23 ENCOUNTER — Other Ambulatory Visit: Payer: Medicare Other

## 2022-08-24 ENCOUNTER — Other Ambulatory Visit (INDEPENDENT_AMBULATORY_CARE_PROVIDER_SITE_OTHER): Payer: Medicare Other

## 2022-08-24 DIAGNOSIS — E890 Postprocedural hypothyroidism: Secondary | ICD-10-CM | POA: Diagnosis not present

## 2022-08-24 LAB — T4, FREE: Free T4: 1.4 ng/dL (ref 0.60–1.60)

## 2022-08-24 LAB — TSH: TSH: 0.37 u[IU]/mL (ref 0.35–5.50)

## 2022-11-15 ENCOUNTER — Encounter (HOSPITAL_COMMUNITY)
Admission: RE | Admit: 2022-11-15 | Discharge: 2022-11-15 | Disposition: A | Discharge status: Home / Self Care | Payer: Medicare Other | Source: Ambulatory Visit | Attending: Nephrology | Admitting: Nephrology

## 2022-11-15 VITALS — BP 141/66 | HR 71 | Temp 97.1°F | Resp 17

## 2022-11-15 DIAGNOSIS — I129 Hypertensive chronic kidney disease with stage 1 through stage 4 chronic kidney disease, or unspecified chronic kidney disease: Secondary | ICD-10-CM | POA: Diagnosis present

## 2022-11-15 LAB — POCT HEMOGLOBIN-HEMACUE: Hemoglobin: 8.3 g/dL — ABNORMAL LOW (ref 12.0–15.0)

## 2022-11-15 MED ORDER — EPOETIN ALFA-EPBX 10000 UNIT/ML IJ SOLN
10000.0000 [IU] | INTRAMUSCULAR | Status: DC
  Administered 2022-11-15: 10000 [IU] via SUBCUTANEOUS

## 2022-11-15 MED ORDER — EPOETIN ALFA-EPBX 10000 UNIT/ML IJ SOLN
INTRAMUSCULAR | Status: AC
  Filled 2022-11-15: qty 1

## 2022-11-28 ENCOUNTER — Other Ambulatory Visit: Payer: Self-pay | Admitting: Internal Medicine

## 2022-11-28 DIAGNOSIS — Z794 Long term (current) use of insulin: Secondary | ICD-10-CM

## 2022-11-29 ENCOUNTER — Encounter (HOSPITAL_COMMUNITY)
Admission: RE | Admit: 2022-11-29 | Discharge: 2022-11-29 | Disposition: A | Discharge status: Home / Self Care | Payer: Medicare Other | Source: Ambulatory Visit | Attending: Nephrology | Admitting: Nephrology

## 2022-11-29 DIAGNOSIS — I129 Hypertensive chronic kidney disease with stage 1 through stage 4 chronic kidney disease, or unspecified chronic kidney disease: Secondary | ICD-10-CM

## 2022-11-29 LAB — POCT HEMOGLOBIN-HEMACUE: Hemoglobin: 7.9 g/dL — ABNORMAL LOW (ref 12.0–15.0)

## 2022-11-29 MED ORDER — EPOETIN ALFA-EPBX 10000 UNIT/ML IJ SOLN
INTRAMUSCULAR | Status: AC
  Filled 2022-11-29: qty 1

## 2022-11-29 MED ORDER — EPOETIN ALFA-EPBX 10000 UNIT/ML IJ SOLN
10000.0000 [IU] | INTRAMUSCULAR | Status: DC
  Administered 2022-11-29: 10000 [IU] via SUBCUTANEOUS

## 2022-11-29 NOTE — Progress Notes (Signed)
Pt here for retacrit injection.  HGB 7.9 via hemocie.  Pt c/o being tired, but no active bleeding noted, no chest pain or shortness of breath.  Amber at Dr Luis Abed office notified.  Okay to proceed with injection as ordered

## 2022-12-13 ENCOUNTER — Encounter (HOSPITAL_COMMUNITY)
Admission: RE | Admit: 2022-12-13 | Discharge: 2022-12-13 | Disposition: A | Discharge status: Home / Self Care | Payer: Medicare Other | Source: Ambulatory Visit | Attending: Nephrology | Admitting: Nephrology

## 2022-12-13 VITALS — BP 125/57 | HR 66 | Temp 97.0°F | Resp 17

## 2022-12-13 DIAGNOSIS — I129 Hypertensive chronic kidney disease with stage 1 through stage 4 chronic kidney disease, or unspecified chronic kidney disease: Secondary | ICD-10-CM | POA: Insufficient documentation

## 2022-12-13 DIAGNOSIS — D631 Anemia in chronic kidney disease: Secondary | ICD-10-CM | POA: Diagnosis not present

## 2022-12-13 DIAGNOSIS — N184 Chronic kidney disease, stage 4 (severe): Secondary | ICD-10-CM | POA: Diagnosis present

## 2022-12-13 LAB — IRON AND TIBC
Iron: 27 ug/dL — ABNORMAL LOW (ref 28–170)
Saturation Ratios: 7 % — ABNORMAL LOW (ref 10.4–31.8)
TIBC: 410 ug/dL (ref 250–450)
UIBC: 383 ug/dL

## 2022-12-13 LAB — FERRITIN: Ferritin: 3 ng/mL — ABNORMAL LOW (ref 11–307)

## 2022-12-13 MED ORDER — EPOETIN ALFA-EPBX 10000 UNIT/ML IJ SOLN: 10000.0000 [IU] | INTRAMUSCULAR | Status: DC

## 2022-12-13 MED ORDER — EPOETIN ALFA-EPBX 10000 UNIT/ML IJ SOLN
INTRAMUSCULAR | Status: AC
  Administered 2022-12-13: 10000 [IU] via SUBCUTANEOUS
  Filled 2022-12-13: qty 1

## 2022-12-14 LAB — POCT HEMOGLOBIN-HEMACUE: Hemoglobin: 8.1 g/dL — ABNORMAL LOW (ref 12.0–15.0)

## 2022-12-25 ENCOUNTER — Other Ambulatory Visit (HOSPITAL_COMMUNITY): Payer: Self-pay | Admitting: *Deleted

## 2022-12-27 ENCOUNTER — Encounter (HOSPITAL_COMMUNITY)
Admission: RE | Admit: 2022-12-27 | Discharge: 2022-12-27 | Disposition: A | Discharge status: Home / Self Care | Payer: Medicare Other | Source: Ambulatory Visit | Attending: Nephrology | Admitting: Nephrology

## 2022-12-27 VITALS — BP 154/69 | HR 87 | Temp 97.3°F | Resp 17

## 2022-12-27 DIAGNOSIS — I129 Hypertensive chronic kidney disease with stage 1 through stage 4 chronic kidney disease, or unspecified chronic kidney disease: Secondary | ICD-10-CM

## 2022-12-27 DIAGNOSIS — N184 Chronic kidney disease, stage 4 (severe): Secondary | ICD-10-CM | POA: Diagnosis not present

## 2022-12-27 MED ORDER — EPOETIN ALFA-EPBX 10000 UNIT/ML IJ SOLN
INTRAMUSCULAR | Status: AC
  Administered 2022-12-27: 20000 [IU] via SUBCUTANEOUS
  Filled 2022-12-27: qty 2

## 2022-12-27 MED ORDER — EPOETIN ALFA-EPBX 10000 UNIT/ML IJ SOLN: 20000.0000 [IU] | INTRAMUSCULAR | Status: DC

## 2022-12-27 NOTE — Progress Notes (Signed)
[name] Le kidney, [name] Le, notified of HemoCue of 7.6. Pt had retacrit dose increased already and will receive her first dose today of 20,000 units.  Pt is also already scheduled for Feraheme infusion at next visit.  Will continue with this plan

## 2022-12-28 ENCOUNTER — Other Ambulatory Visit: Payer: Self-pay | Admitting: Internal Medicine

## 2022-12-28 DIAGNOSIS — Z794 Long term (current) use of insulin: Secondary | ICD-10-CM

## 2022-12-28 LAB — POCT HEMOGLOBIN-HEMACUE: Hemoglobin: 7.6 g/dL — ABNORMAL LOW (ref 12.0–15.0)

## 2022-12-31 ENCOUNTER — Encounter (HOSPITAL_COMMUNITY)
Admission: RE | Admit: 2022-12-31 | Discharge: 2022-12-31 | Disposition: A | Discharge status: Home / Self Care | Payer: Medicare Other | Source: Ambulatory Visit | Attending: Nephrology | Admitting: Nephrology

## 2022-12-31 DIAGNOSIS — N184 Chronic kidney disease, stage 4 (severe): Secondary | ICD-10-CM | POA: Diagnosis not present

## 2022-12-31 MED ORDER — SODIUM CHLORIDE 0.9 % IV SOLN
510.0000 mg | Freq: Once | INTRAVENOUS | Status: AC
  Administered 2022-12-31: 510 mg via INTRAVENOUS
  Filled 2022-12-31: qty 510

## 2023-01-03 ENCOUNTER — Encounter (HOSPITAL_COMMUNITY): Payer: Medicare Other

## 2023-01-07 ENCOUNTER — Other Ambulatory Visit: Payer: Self-pay | Admitting: Internal Medicine

## 2023-01-10 ENCOUNTER — Ambulatory Visit (HOSPITAL_COMMUNITY)
Admission: RE | Admit: 2023-01-10 | Discharge: 2023-01-10 | Disposition: A | Discharge status: Home / Self Care | Payer: Medicare Other | Source: Ambulatory Visit | Attending: Nephrology | Admitting: Nephrology

## 2023-01-10 VITALS — BP 157/66 | HR 57 | Temp 97.8°F | Resp 18

## 2023-01-10 DIAGNOSIS — I129 Hypertensive chronic kidney disease with stage 1 through stage 4 chronic kidney disease, or unspecified chronic kidney disease: Secondary | ICD-10-CM | POA: Diagnosis present

## 2023-01-10 LAB — IRON AND TIBC
Iron: 69 ug/dL (ref 28–170)
Saturation Ratios: 19 % (ref 10.4–31.8)
TIBC: 367 ug/dL (ref 250–450)
UIBC: 298 ug/dL

## 2023-01-10 LAB — POCT HEMOGLOBIN-HEMACUE: Hemoglobin: 9.1 g/dL — ABNORMAL LOW (ref 12.0–15.0)

## 2023-01-10 LAB — FERRITIN: Ferritin: 106 ng/mL (ref 11–307)

## 2023-01-10 MED ORDER — EPOETIN ALFA-EPBX 10000 UNIT/ML IJ SOLN: 20000.0000 [IU] | INTRAMUSCULAR | Status: DC

## 2023-01-10 MED ORDER — EPOETIN ALFA-EPBX 10000 UNIT/ML IJ SOLN
INTRAMUSCULAR | Status: AC
  Administered 2023-01-10: 20000 [IU] via SUBCUTANEOUS
  Filled 2023-01-10: qty 2

## 2023-01-22 ENCOUNTER — Other Ambulatory Visit: Payer: Self-pay | Admitting: Internal Medicine

## 2023-01-24 ENCOUNTER — Encounter (HOSPITAL_COMMUNITY)
Admission: RE | Admit: 2023-01-24 | Discharge: 2023-01-24 | Disposition: A | Discharge status: Home / Self Care | Payer: Medicare Other | Source: Ambulatory Visit | Attending: Nephrology | Admitting: Nephrology

## 2023-01-24 VITALS — BP 143/67 | HR 70 | Temp 97.5°F | Resp 18

## 2023-01-24 DIAGNOSIS — I129 Hypertensive chronic kidney disease with stage 1 through stage 4 chronic kidney disease, or unspecified chronic kidney disease: Secondary | ICD-10-CM | POA: Diagnosis not present

## 2023-01-24 LAB — POCT HEMOGLOBIN-HEMACUE: Hemoglobin: 10.2 g/dL — ABNORMAL LOW (ref 12.0–15.0)

## 2023-01-24 MED ORDER — EPOETIN ALFA-EPBX 10000 UNIT/ML IJ SOLN: 20000.0000 [IU] | INTRAMUSCULAR | Status: DC

## 2023-01-24 MED ORDER — EPOETIN ALFA-EPBX 10000 UNIT/ML IJ SOLN
INTRAMUSCULAR | Status: AC
  Administered 2023-01-24: 20000 [IU] via SUBCUTANEOUS
  Filled 2023-01-24: qty 2

## 2023-02-07 ENCOUNTER — Encounter (HOSPITAL_COMMUNITY)
Admission: RE | Admit: 2023-02-07 | Discharge: 2023-02-07 | Disposition: A | Discharge status: Home / Self Care | Payer: Medicare Other | Source: Ambulatory Visit | Attending: Nephrology | Admitting: Nephrology

## 2023-02-07 VITALS — BP 132/75 | HR 79 | Temp 97.4°F | Resp 17

## 2023-02-07 DIAGNOSIS — I129 Hypertensive chronic kidney disease with stage 1 through stage 4 chronic kidney disease, or unspecified chronic kidney disease: Secondary | ICD-10-CM | POA: Diagnosis not present

## 2023-02-07 LAB — POCT HEMOGLOBIN-HEMACUE: Hemoglobin: 10.6 g/dL — ABNORMAL LOW (ref 12.0–15.0)

## 2023-02-07 MED ORDER — EPOETIN ALFA-EPBX 10000 UNIT/ML IJ SOLN
INTRAMUSCULAR | Status: AC
  Filled 2023-02-07: qty 2

## 2023-02-07 MED ORDER — EPOETIN ALFA-EPBX 10000 UNIT/ML IJ SOLN
20000.0000 [IU] | INTRAMUSCULAR | Status: DC
  Administered 2023-02-07: 20000 [IU] via SUBCUTANEOUS

## 2023-02-21 ENCOUNTER — Encounter (HOSPITAL_COMMUNITY)
Admission: RE | Admit: 2023-02-21 | Discharge: 2023-02-21 | Disposition: A | Discharge status: Home / Self Care | Payer: Medicare Other | Source: Ambulatory Visit | Attending: Nephrology | Admitting: Nephrology

## 2023-02-21 VITALS — BP 151/66 | HR 69 | Temp 97.8°F

## 2023-02-21 DIAGNOSIS — D631 Anemia in chronic kidney disease: Secondary | ICD-10-CM | POA: Insufficient documentation

## 2023-02-21 DIAGNOSIS — N184 Chronic kidney disease, stage 4 (severe): Secondary | ICD-10-CM | POA: Diagnosis not present

## 2023-02-21 DIAGNOSIS — I129 Hypertensive chronic kidney disease with stage 1 through stage 4 chronic kidney disease, or unspecified chronic kidney disease: Secondary | ICD-10-CM | POA: Insufficient documentation

## 2023-02-21 LAB — IRON AND TIBC
Iron: 80 ug/dL (ref 28–170)
Saturation Ratios: 23 % (ref 10.4–31.8)
TIBC: 347 ug/dL (ref 250–450)
UIBC: 267 ug/dL

## 2023-02-21 LAB — FERRITIN: Ferritin: 33 ng/mL (ref 11–307)

## 2023-02-21 LAB — POCT HEMOGLOBIN-HEMACUE: Hemoglobin: 10.9 g/dL — ABNORMAL LOW (ref 12.0–15.0)

## 2023-02-21 MED ORDER — EPOETIN ALFA-EPBX 10000 UNIT/ML IJ SOLN
20000.0000 [IU] | INTRAMUSCULAR | Status: DC
  Administered 2023-02-21: 20000 [IU] via SUBCUTANEOUS

## 2023-02-21 MED ORDER — EPOETIN ALFA-EPBX 10000 UNIT/ML IJ SOLN
INTRAMUSCULAR | Status: AC
  Filled 2023-02-21: qty 2

## 2023-03-07 ENCOUNTER — Encounter (HOSPITAL_COMMUNITY)
Admission: RE | Admit: 2023-03-07 | Discharge: 2023-03-07 | Disposition: A | Discharge status: Home / Self Care | Payer: Medicare Other | Source: Ambulatory Visit | Attending: Nephrology | Admitting: Nephrology

## 2023-03-07 VITALS — BP 134/65 | HR 61 | Temp 97.4°F | Resp 17

## 2023-03-07 DIAGNOSIS — N184 Chronic kidney disease, stage 4 (severe): Secondary | ICD-10-CM | POA: Diagnosis not present

## 2023-03-07 DIAGNOSIS — I129 Hypertensive chronic kidney disease with stage 1 through stage 4 chronic kidney disease, or unspecified chronic kidney disease: Secondary | ICD-10-CM

## 2023-03-07 LAB — POCT HEMOGLOBIN-HEMACUE: Hemoglobin: 11.1 g/dL — ABNORMAL LOW (ref 12.0–15.0)

## 2023-03-07 MED ORDER — EPOETIN ALFA-EPBX 10000 UNIT/ML IJ SOLN
10000.0000 [IU] | INTRAMUSCULAR | Status: DC
  Administered 2023-03-07: 10000 [IU] via SUBCUTANEOUS

## 2023-03-07 MED ORDER — EPOETIN ALFA-EPBX 10000 UNIT/ML IJ SOLN
INTRAMUSCULAR | Status: AC
  Filled 2023-03-07: qty 1

## 2023-03-21 ENCOUNTER — Encounter (HOSPITAL_COMMUNITY)
Admission: RE | Admit: 2023-03-21 | Discharge: 2023-03-21 | Disposition: A | Discharge status: Home / Self Care | Payer: Medicare Other | Source: Ambulatory Visit | Attending: Nephrology | Admitting: Nephrology

## 2023-03-21 VITALS — BP 142/62 | HR 82 | Temp 97.5°F | Resp 17

## 2023-03-21 DIAGNOSIS — N184 Chronic kidney disease, stage 4 (severe): Secondary | ICD-10-CM | POA: Insufficient documentation

## 2023-03-21 DIAGNOSIS — D631 Anemia in chronic kidney disease: Secondary | ICD-10-CM | POA: Diagnosis not present

## 2023-03-21 DIAGNOSIS — I129 Hypertensive chronic kidney disease with stage 1 through stage 4 chronic kidney disease, or unspecified chronic kidney disease: Secondary | ICD-10-CM | POA: Insufficient documentation

## 2023-03-21 LAB — IRON AND TIBC
Iron: 87 ug/dL (ref 28–170)
Saturation Ratios: 23 % (ref 10.4–31.8)
TIBC: 384 ug/dL (ref 250–450)
UIBC: 297 ug/dL

## 2023-03-21 LAB — POCT HEMOGLOBIN-HEMACUE: Hemoglobin: 11.6 g/dL — ABNORMAL LOW (ref 12.0–15.0)

## 2023-03-21 LAB — FERRITIN: Ferritin: 12 ng/mL (ref 11–307)

## 2023-03-21 MED ORDER — EPOETIN ALFA-EPBX 10000 UNIT/ML IJ SOLN
INTRAMUSCULAR | Status: AC
  Administered 2023-03-21: 10000 [IU]
  Filled 2023-03-21: qty 1

## 2023-03-21 MED ORDER — EPOETIN ALFA-EPBX 10000 UNIT/ML IJ SOLN: 10000.0000 [IU] | INTRAMUSCULAR | Status: DC

## 2023-04-04 ENCOUNTER — Encounter (HOSPITAL_COMMUNITY): Payer: Medicare Other

## 2023-04-18 ENCOUNTER — Encounter (HOSPITAL_COMMUNITY): Payer: Medicare Other

## 2023-04-23 ENCOUNTER — Ambulatory Visit (HOSPITAL_COMMUNITY)
Admission: RE | Admit: 2023-04-23 | Discharge: 2023-04-23 | Disposition: A | Discharge status: Home / Self Care | Payer: Medicare Other | Source: Ambulatory Visit | Attending: Nephrology | Admitting: Nephrology

## 2023-04-23 VITALS — BP 140/63 | HR 54 | Temp 97.0°F | Resp 17

## 2023-04-23 DIAGNOSIS — D631 Anemia in chronic kidney disease: Secondary | ICD-10-CM | POA: Insufficient documentation

## 2023-04-23 DIAGNOSIS — I129 Hypertensive chronic kidney disease with stage 1 through stage 4 chronic kidney disease, or unspecified chronic kidney disease: Secondary | ICD-10-CM | POA: Diagnosis present

## 2023-04-23 LAB — IRON AND TIBC
Iron: 116 ug/dL (ref 28–170)
Saturation Ratios: 30 % (ref 10.4–31.8)
TIBC: 384 ug/dL (ref 250–450)
UIBC: 268 ug/dL

## 2023-04-23 LAB — POCT HEMOGLOBIN-HEMACUE: Hemoglobin: 10.9 g/dL — ABNORMAL LOW (ref 12.0–15.0)

## 2023-04-23 LAB — FERRITIN: Ferritin: 10 ng/mL — ABNORMAL LOW (ref 11–307)

## 2023-04-23 MED ORDER — EPOETIN ALFA-EPBX 10000 UNIT/ML IJ SOLN
INTRAMUSCULAR | Status: AC
  Filled 2023-04-23: qty 1

## 2023-04-23 MED ORDER — EPOETIN ALFA-EPBX 10000 UNIT/ML IJ SOLN
10000.0000 [IU] | INTRAMUSCULAR | Status: DC
  Administered 2023-04-23: 10000 [IU] via SUBCUTANEOUS

## 2023-05-16 ENCOUNTER — Encounter (HOSPITAL_COMMUNITY): Payer: Medicare Other

## 2023-05-16 ENCOUNTER — Ambulatory Visit: Payer: Medicare Other | Admitting: Internal Medicine

## 2023-05-16 NOTE — Progress Notes (Deleted)
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  Last visit 10 months ago.  Interim history: No increased urination, blurry vision, nausea, abdominal pain, constipation. She has no complaints at this visit.  Primary hyperparathyroidism  FNA x3 in 12/2013: Benign  Technetium sestamibi parathyroid scan (08/2014) was nonlocalizing  Thyroid ultrasound (01/30/2021): Multinodular goiter with surveillance needed for right isthmic and right inferior nodules  Technetium sestamibi parathyroid scan (02/02/2021): Consistent with left superior parathyroid adenoma  FNA of the right mid thyroid nodule (02/22/2021): benign  Total thyroidectomy with left superior parathyroidectomy (03/06/2021): Pathology:  Left superior hypercellular parathyroid tissue with fibrosis Follicular variant of papillary thyroid cancer measuring 1.5 cm: A. PARATHYROID, LEFT SUPERIOR, PARATHYROIDECTOMY:  - Hypercellular parathyroid tissue associated with fibrosis.  - See comment.   B. THYROID, TOTAL, THYROIDECTOMY:  - Papillary thyroid carcinoma, follicular variant, 1.5 cm.  - Nodular hyperplasia (multinodular goiter).  - Margins not involved.  - See oncology table.   COMMENT:  A.  The parathyroid specimen shows irregular nests of hypercellular  parathyroid tissue with intervening bands of fibrous tissue.  There is  no necrosis, significant atypia or mitotic activity.   B. ONCOLOGY TABLE:  THYROID GLAND, CARCINOMA: Resection  Procedure: Thyroidectomy.  Tumor Focality: Unifocal.  Tumor Site: Right lobe.  Tumor Size: 1.5 cm, glass slide measurement.  Histologic Type: Papillary thyroid carcinoma, follicular variant.  Angioinvasion: Not  identified.  Lymphatic Invasion: Not identified.  Extrathyroidal Extension: Not identified.  Margin Status: All margins negative for carcinoma.       Regional Lymph Node Status: No lymph nodes submitted.  Pathologic Stage Classification (pTNM, AJCC 8th Edition): pT1b, pN not  assigned.  Representative Tumor Block: B1 and B2  Comment(s): The thyroid shows multiple hyperplastic nodules and in the  right lobe there is a 1.5 cm nodule with follicular architecture and  nuclear features of papillary thyroid carcinoma.   She is on levothyroxine 100 mcg daily (dose increased 06/2022): - in am - fasting - at least 30 min from b'fast - no calcium - no iron - stopped multivitamins - no PPIs - not on Biotin  08/23/2022: TSH 5.29 Lab Results  Component Value Date   TSH 0.37 08/24/2022   TSH 5.29 06/28/2022   TSH 1.56 06/26/2021   Reviewed pertinent labs:  Lab Results  Component Value Date   CALCIUM 10.8 (H) 06/28/2022   PTH 49 11/02/2021  03/28/2021: PTH 98, calcium 10.2  08/18/2020: Calcium 11.2, albumin 4.3, magnesium 2.3 BUN/creatinine 35/1.53, GFR 38, glucose 153 hemoglobin 10.1  06/10/2018: Glucose 89, BUN/creatinine 21/1.19, EGFR 54 Calcium 11.8, corrected 11.7 (8.6-10.3)  Reviewed vitamin D levels: Lab Results  Component Value Date   VD25OH 32.54 06/28/2022   VD25OH 33.29 11/02/2021   VD25OH 38.21 06/26/2021   VD25OH 27.54 (L) 04/09/2017   VD25OH 30.17 04/09/2016   VD25OH 30.37 10/11/2015   VD25OH 48.75 03/29/2015   She was on 1000 units vitamin D daily and calcium 220 mg daily in MVI >> we stopped multivitamins and I advised her to start 2000 units vitamin D daily.  At last visit she was off and I again advised her to restart.  24h Urine calcium was at the ULN.  She has osteopenia per review of her DXA scan report from 08/2018.  Pt denies: -  feeling nodules in neck - hoarseness - dysphagia - choking  DM2:  Reviewed HbA1c levels: 08/24/2022: HbA1c  7.1% Lab Results  Component Value Date   HGBA1C 7.1 (A) 06/28/2022   HGBA1C 8.2 (A) 11/02/2021   HGBA1C 6.9 (A) 06/26/2021  08/06/2017: HbA1c 8.8%  Patient is  on: -  stopped before last visit, could not remember when or why - Novolog 70/30:>> ran out  >> Novolin 70/30 >> back on Novolog 70/30: - 30 units before b'fast ... >> 20 units >> 20-24 units 15 min before b'fast - 20 units before dinner ... >> 20 units  - at bedtime >> 20-24 units 15 min before dinner  Pt checks her sugars once a day: - 11-11:30 am:  74-140 >> 130-140, 170 >> 95-269 >> 140-249 - before lunch: n/c >> 196 >> n/c - 2h after lunch: n/c >> 155, 351 (pancakes) >> n/c - before dinner: n/c >> 185-190 >> n/c - 2h after dinner: 150s >> 153-204 >> 82, 90 >> 135-140 >> n/c  - bedtime: n/c >> 96 >> n/c - nighttime:  occasionally 55 x3 >> n/c >> 130-140 >> n/c Lowest sugar was 40s >>... 48 >> 74 >> 95 >> 100; she has hypoglycemia awareness in the 60s. Highest sugar was 400 >> 249.  Glucometer: Accuchek Aviva  Pt's meals are: - Brunch: 7 layer salad, ham, [place]; cereal; egg + bacon + apple butter + OJ; fruit cocktail - Dinner: sandwich with meat; bowl of jello - Snacks: icecream, cookies, potato chips - after dinner, at 12 am-1 am  -+ CKD-seeing nephrology-Dr. Malen Gauze, last BUN/creatinine:  08/24/2022: GFR 26.15, creatinine 1.84, glucose 115 Lab Results  Component Value Date   BUN 26 (H) 06/28/2022   BUN 31 (H) 03/07/2021   CREATININE 1.84 (H) 06/28/2022   CREATININE 1.74 (H) 03/07/2021   Lab Results  Component Value Date   MICRALBCREAT 0.5 06/28/2022  06/10/2018: Glucose 89, BUN/creatinine 21/1.19, EGFR 54 On Olmesartan 40.  -+ HL; lipids not available for review: 08/24/2022: 134/122/36/79 Lab Results  Component Value Date   CHOL 134 06/28/2022   HDL 30.10 (L) 06/28/2022   LDLCALC 79 06/28/2022   TRIG 122.0 06/28/2022   CHOLHDL 4 06/28/2022   On Pravastatin 80.  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 Dimas Millin.  -She denies numbness and tingling in her feet.  Last foot exam 06/28/2022.  No family history of diabetes.  ROS: + see HPI  I reviewed pt's medications, allergies, PMH, social hx, family hx, and changes were documented in the history of present illness. Otherwise, unchanged from my initial visit note.  Past Medical History:  Diagnosis Date   CKD (chronic kidney disease), stage III (HCC)    Diabetes mellitus without complication (HCC)    High cholesterol    Hypertension    Nontoxic goiter    Symptomatic anemia 02/04/2017   Past Surgical History:  Procedure Laterality Date   ABDOMINAL HYSTERECTOMY     COLONOSCOPY WITH PROPOFOL N/A 02/05/2017   Procedure: COLONOSCOPY WITH PROPOFOL;  Surgeon: Sherrilyn Rist, MD;  Location: [facility];  Service: Endoscopy;  Laterality: N/A;   ESOPHAGOGASTRODUODENOSCOPY (EGD) WITH PROPOFOL N/A 02/05/2017   Procedure: ESOPHAGOGASTRODUODENOSCOPY (EGD) WITH PROPOFOL;  Surgeon: Sherrilyn Rist, MD;  Location: [facility];  Service: Endoscopy;  Laterality: N/A;   PARATHYROIDECTOMY Left 03/06/2021   Procedure: LEFT SUPERIOR PARATHYROIDECTOMY;  Surgeon: Darnell Level, MD;  Location: [facility];  Service: General;  Laterality: Left;   REPLACEMENT TOTAL KNEE Right    THYROIDECTOMY N/A 03/06/2021   Procedure: TOTAL THYROIDECTOMY;  Surgeon: Darnell Level, MD;  Location: [facility];  Service: General;  Laterality: N/A;   Social History   Socioeconomic History   Marital status: Married    Spouse name: Not on file   Number of children: 1   Years of education: Not on file   Highest education level: Not on file  Occupational History   Occupation: retired  Tobacco Use   Smoking status: Never   Smokeless tobacco: Never  Vaping Use   Vaping status: Never Used  Substance and Sexual Activity   Alcohol use: Yes    Comment: wine occasional per pt.    Drug use: No    Types: Marijuana     Comment: Previously smoked. Quit in 1981   Sexual activity: Not on file  Other Topics Concern   Not on file  Social History Narrative   Not on file   Social Determinants of Health   Financial Resource Strain: Not on file  Food Insecurity: Not on file  Transportation Needs: Not on file  Physical Activity: Not on file  Stress: Not on file  Social Connections: Not on file  Intimate Partner Violence: Not on file   Current Outpatient Medications on File Prior to Visit  Medication Sig Dispense Refill   albuterol (VENTOLIN HFA) 108 (90 Base) MCG/ACT inhaler Inhale 2 puffs into the lungs every 6 (six) hours as needed for wheezing or shortness of breath.     aspirin EC 81 MG tablet Take 81 mg by mouth daily.     Aspirin-Acetaminophen-Caffeine (GOODY HEADACHE PO) Take 1 packet by mouth daily as needed (pain/headache).     BD PEN NEEDLE NANO 2ND GEN 32G X 4 MM MISC 1 EACH BY DOES NOT APPLY ROUTE IN THE MORNING AND AT BEDTIME. USE WITH INSULIN PEN 2 TIMES A DAY.DUE FOR APPOINTMENT 200 each 3   Blood Glucose Monitoring Suppl (ACCU-CHEK GUIDE ME) w/Device KIT Use as advised 1 kit 0   budesonide-formoterol (SYMBICORT) 160-4.5 MCG/ACT inhaler Inhale 2 puffs into the lungs 2 (two) times daily as needed (wheezing or shortness of breath).     chlorthalidone (HYGROTON) 25 MG tablet Take 25 mg by mouth daily. 1 tablet three times a week     Cholecalciferol (VITAMIN D) 50 MCG (2000 UT) tablet Take 2,000 Units by mouth daily.     COVID-19 mRNA bivalent vaccine, Pfizer, (PFIZER COVID-19 VAC BIVALENT) injection Inject into the muscle. 0.3 mL 0   Cyanocobalamin 1500 MCG TBDP Take 1,500 mcg by mouth daily.     ELDERBERRY PO Take 100 mg by mouth daily.     glucose blood (ACCU-CHEK AVIVA PLUS) test strip USE AS INSTRUCTED 3-4 TIMES A DAY 400 strip 3   hydrALAZINE (APRESOLINE) 100 MG tablet Take 100 mg by mouth 3 (three) times daily.     insulin aspart protamine - aspart (NOVOLOG MIX 70/30 FLEXPEN) (70-30) 100  UNIT/ML FlexPen INJECT 20-24 UNITS UNDER SKIN 2X A DAY BEFORE MEALS 45 mL 0   insulin NPH-regular Human (NOVOLIN 70/30 RELION) (70-30) 100 UNIT/ML injection Inject 15-20 Units into the skin 2 (two) times daily before a meal. (Patient not taking: Reported on 11/02/2021) 20 mL 11   Insulin Syringe-Needle U-100 (B-D INSULIN SYRINGE) 31G X 5/16" 0.3 ML MISC Use 2-3x a day 200 each 3   lactulose (CHRONULAC) 10 GM/15ML solution Take 15-30 cc daily as needed  for bowels (Patient taking differently: Take 10-20 g by mouth daily as needed for moderate constipation.) 236 mL 11   latanoprost (XALATAN) 0.005 % ophthalmic solution Place 1 drop into both eyes at bedtime.     levothyroxine (SYNTHROID) 100 MCG tablet TAKE 1 TABLET BY MOUTH EVERY DAY 90 tablet 1   metFORMIN (GLUCOPHAGE) 500 MG tablet TAKE 1 TABLET BY MOUTH TWICE A DAY 60 tablet 2   metoprolol tartrate (LOPRESSOR) 100 MG tablet Take 100 mg by mouth 2 (two) times daily.     Multiple Vitamins-Minerals (CENTRUM SILVER ADULT 50+) TABS Take 1 tablet by mouth daily.     olmesartan (BENICAR) 40 MG tablet Take 40 mg by mouth daily.     pravastatin (PRAVACHOL) 80 MG tablet Take 80 mg by mouth daily.  5   Propylene Glycol 0.6 % SOLN Place 1 drop into both eyes daily as needed (swelling or redness).     traMADol (ULTRAM) 50 MG tablet Take 1-2 tablets (50-100 mg total) by mouth every 6 (six) hours as needed for moderate pain. 15 tablet 0   No current facility-administered medications on file prior to visit.   Allergies  Allergen Reactions   Ace Inhibitors Cough   Family History  Problem Relation Age of Onset   Stroke Mother    Cancer Father        pts states cancer in back   Lung cancer Sister    Heart disease Sister    Cancer Brother        "   Colon cancer Neg Hx    PE: There were no vitals taken for this visit. Wt Readings from Last 3 Encounters:  12/31/22 187 lb (84.8 kg)  06/28/22 191 lb 9.6 oz (86.9 kg)  12/11/21 183 lb (83 kg)    Constitutional: overweight, in NAD Eyes:  EOMI, no exophthalmos ENT: no neck masses, no cervical lymphadenopathy Cardiovascular: RRR, No MRG Respiratory: CTA B Musculoskeletal: no deformities Skin:no rashes Neurological: no tremor with outstretched hands  ASSESSMENT: 1. DM2, insulin-dependent, uncontrolled, with complications - CKD stage 3  2. Primary  HPTH  3. Vitamin D deficiency  4.  Follicular variant of papillary thyroid cancer  5.  Postsurgical hypothyroidism  PLAN:  1. Patient with uncontrolled type 2 diabetes, on premixed insulin regimen.  At last visit, he came off metformin but could not remember why.  HbA1c was slightly better, at 7.1%, decreased from 8.2%.  At that time, sugars were higher than expected from the HbA1c and she was thinking that her meter was inaccurate.  I called in a prescription for another meter to her pharmacy.  We checked her blood sugar in the office, this was 145.  She reported eating candy corn and other sweets and I strongly advised her to stop.  I also recommended to check blood sugars at different times of the day, rotating check times.  Due to the improvement in the HbA1c, we did not change her regimen. -She had another HbA1c 9 months ago which was stable, at 7.1%.  - I suggested to:  Patient Instructions  Please stop at the lab.  Please continue Levothyroxine 100 mcg daily.  Take the thyroid hormone every day, with water, at least 30 minutes before breakfast, separated by at least 4 hours from: - acid reflux medications - calcium - iron - multivitamins  Continue Novolog 70/30: - 20-24 units 15 min before b'fast - 20-24 units 15 min before dinner  Please continue vitamin D 2000 units daily.  Please come back for a follow-up appointment in 4 months.  - we checked her HbA1c: 7%  - advised to check sugars at different times of the day - 2x a day, rotating check times - advised for yearly eye exams >> she is UTD - return to  clinic in 4 months   2. Primary  HPTH and 3. vitamin D deficiency -s/p left superior parathyroidectomy with resection of hypercellular parathyroid focus -She started to feel much better after surgery -At last check, ionized calcium was normal, at 5.5 -She has been lately on 2000 units vitamin D daily, but at last visit she was off the medication and could not remember when and why she stopped.  Her vitamin D level at that time was 32, at the lower limit of the target range.  I advised her to restart the 2000 unit vitamin D supplement. -We will recheck a vitamin D level today  4.  Follicular variant of papillary thyroid cancer -She has a history of multinodular goiter, with previous benign biopsies.  However, she had total thyroidectomy in 02/2021 and a 1.5 cm focus of follicular variant of PTC was found -We discussed that thyroid cancer is a slow-growing cancer with good prognosis. -We did not proceed with RAI ablation since her thyroid cancer focus was small and without high risk features -Last check, her thyroglobulin level was slightly higher, but still very low, at 0.4.  AT antibodies are undetectable.  In the absence of RAI treatment, a certain amount of variability in thyroglobulin level is expected -Will check the thyroglobulin and ATA today -At last visit I ordered a neck ultrasound but she did not have this yet -will reorder this  5.  Postsurgical hypothyroidism - latest thyroid labs reviewed with pt. >> normal: Lab Results  Component Value Date   TSH 0.37 08/24/2022  - she continues on LT4 100 mcg daily, dose increased at last visit - pt feels good on this dose. - we discussed about taking the thyroid hormone every day, with water, >30 minutes before breakfast, separated by >4 hours from acid reflux medications, calcium, iron, multivitamins. Pt. is taking it correctly. - will check thyroid tests today: TSH and fT4 - If labs are abnormal, she will need to return for repeat TFTs in  1.5 months  Carlus Pavlov, MD PhD [facility]

## 2023-05-21 ENCOUNTER — Ambulatory Visit (HOSPITAL_COMMUNITY)
Admission: RE | Admit: 2023-05-21 | Discharge: 2023-05-21 | Disposition: A | Discharge status: Home / Self Care | Payer: Medicare Other | Source: Ambulatory Visit | Attending: Nephrology | Admitting: Nephrology

## 2023-05-21 VITALS — BP 125/55 | HR 100 | Temp 97.3°F | Resp 16

## 2023-05-21 DIAGNOSIS — Z79890 Hormone replacement therapy (postmenopausal): Secondary | ICD-10-CM | POA: Insufficient documentation

## 2023-05-21 DIAGNOSIS — I129 Hypertensive chronic kidney disease with stage 1 through stage 4 chronic kidney disease, or unspecified chronic kidney disease: Secondary | ICD-10-CM | POA: Insufficient documentation

## 2023-05-21 DIAGNOSIS — D631 Anemia in chronic kidney disease: Secondary | ICD-10-CM | POA: Insufficient documentation

## 2023-05-21 DIAGNOSIS — N189 Chronic kidney disease, unspecified: Secondary | ICD-10-CM | POA: Insufficient documentation

## 2023-05-21 LAB — FERRITIN: Ferritin: 8 ng/mL — ABNORMAL LOW (ref 11–307)

## 2023-05-21 LAB — IRON AND TIBC
Iron: 80 ug/dL (ref 28–170)
Saturation Ratios: 22 % (ref 10.4–31.8)
TIBC: 364 ug/dL (ref 250–450)
UIBC: 284 ug/dL

## 2023-05-21 LAB — POCT HEMOGLOBIN-HEMACUE: Hemoglobin: 11.6 g/dL — ABNORMAL LOW (ref 12.0–15.0)

## 2023-05-21 MED ORDER — EPOETIN ALFA-EPBX 10000 UNIT/ML IJ SOLN
INTRAMUSCULAR | Status: AC
  Filled 2023-05-21: qty 1

## 2023-05-21 MED ORDER — EPOETIN ALFA-EPBX 10000 UNIT/ML IJ SOLN
10000.0000 [IU] | INTRAMUSCULAR | Status: DC
  Administered 2023-05-21: 10000 [IU] via SUBCUTANEOUS

## 2023-05-23 ENCOUNTER — Ambulatory Visit (INDEPENDENT_AMBULATORY_CARE_PROVIDER_SITE_OTHER): Payer: Medicare Other | Admitting: Internal Medicine

## 2023-05-23 ENCOUNTER — Other Ambulatory Visit: Payer: Self-pay | Admitting: Internal Medicine

## 2023-05-23 VITALS — BP 120/70 | HR 62 | Ht 66.0 in | Wt 188.4 lb

## 2023-05-23 DIAGNOSIS — Z794 Long term (current) use of insulin: Secondary | ICD-10-CM

## 2023-05-23 DIAGNOSIS — E890 Postprocedural hypothyroidism: Secondary | ICD-10-CM

## 2023-05-23 DIAGNOSIS — E1121 Type 2 diabetes mellitus with diabetic nephropathy: Secondary | ICD-10-CM | POA: Diagnosis not present

## 2023-05-23 DIAGNOSIS — E213 Hyperparathyroidism, unspecified: Secondary | ICD-10-CM | POA: Diagnosis not present

## 2023-05-23 DIAGNOSIS — E559 Vitamin D deficiency, unspecified: Secondary | ICD-10-CM | POA: Diagnosis not present

## 2023-05-23 DIAGNOSIS — C73 Malignant neoplasm of thyroid gland: Secondary | ICD-10-CM

## 2023-05-23 LAB — COMPREHENSIVE METABOLIC PANEL
ALT: 10 U/L (ref 0–35)
AST: 12 U/L (ref 0–37)
Albumin: 3.8 g/dL (ref 3.5–5.2)
Alkaline Phosphatase: 83 U/L (ref 39–117)
BUN: 40 mg/dL — ABNORMAL HIGH (ref 6–23)
CO2: 27 meq/L (ref 19–32)
Calcium: 10.6 mg/dL — ABNORMAL HIGH (ref 8.4–10.5)
Chloride: 104 meq/L (ref 96–112)
Creatinine, Ser: 2.01 mg/dL — ABNORMAL HIGH (ref 0.40–1.20)
GFR: 23.37 mL/min — ABNORMAL LOW (ref >60.00)
Glucose, Bld: 263 mg/dL — ABNORMAL HIGH (ref 70–99)
Potassium: 4.2 meq/L (ref 3.5–5.1)
Sodium: 138 meq/L (ref 135–145)
Total Bilirubin: 0.4 mg/dL (ref 0.2–1.2)
Total Protein: 6.7 g/dL (ref 6.0–8.3)

## 2023-05-23 LAB — TSH: TSH: 0.2 u[IU]/mL — ABNORMAL LOW (ref 0.35–5.50)

## 2023-05-23 LAB — VITAMIN D 25 HYDROXY (VIT D DEFICIENCY, FRACTURES): VITD: 25.27 ng/mL — ABNORMAL LOW (ref 30.00–100.00)

## 2023-05-23 LAB — LIPID PANEL
Cholesterol: 171 mg/dL (ref 0–200)
HDL: 27.7 mg/dL — ABNORMAL LOW (ref >39.00)
LDL Cholesterol: 109 mg/dL — ABNORMAL HIGH (ref 0–99)
NonHDL: 143.28
Total CHOL/HDL Ratio: 6
Triglycerides: 173 mg/dL — ABNORMAL HIGH (ref 0.0–149.0)
VLDL: 34.6 mg/dL (ref 0.0–40.0)

## 2023-05-23 LAB — T4, FREE: Free T4: 1.33 ng/dL (ref 0.60–1.60)

## 2023-05-23 MED ORDER — FREESTYLE LIBRE 3 SENSOR MISC: 1.0000 | 3 refills | Status: DC

## 2023-05-23 MED ORDER — FREESTYLE LIBRE 3 READER DEVI: 3 refills | Status: DC

## 2023-05-23 NOTE — Patient Instructions (Addendum)
Please stop at the lab.  Please continue Levothyroxine 100 mcg daily.  Take the thyroid hormone every day, with water, at least 30 minutes before breakfast, separated by at least 4 hours from: - acid reflux medications - calcium - iron - multivitamins  Increase Novolog 70/30: - 20-24 >> 26-28 units 15 min before b'fast - 20-24 >> 26-28 units 15 min before dinner  STOP FRENCH FRIES!!!  START CHECKING BLOOD SUGARS!  Please restart vitamin D 2000 units daily.  Please come back for a follow-up appointment in 2-3 months.

## 2023-05-23 NOTE — Progress Notes (Addendum)
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  Last visit 10 months ago.  Interim history: No increased urination, blurry vision, nausea, abdominal pain, constipation. She has no complaints at this visit.  She feels great! She eats french fries and other fried foods every day - starts to cook them at 4 am. Also, eats many sweet, including candy.  Primary hyperparathyroidism  FNA x3 in 12/2013: Benign  Technetium sestamibi parathyroid scan (08/2014) was nonlocalizing  Thyroid ultrasound (01/30/2021): Multinodular goiter with surveillance needed for right isthmic and right inferior nodules  Technetium sestamibi parathyroid scan (02/02/2021): Consistent with left superior parathyroid adenoma  FNA of the right mid thyroid nodule (02/22/2021): benign  Total thyroidectomy with left superior parathyroidectomy (03/06/2021): Pathology:  Left superior hypercellular parathyroid tissue with fibrosis Follicular variant of papillary thyroid cancer measuring 1.5 cm: A. PARATHYROID, LEFT SUPERIOR, PARATHYROIDECTOMY:  - Hypercellular parathyroid tissue associated with fibrosis.  - See comment.   B. THYROID, TOTAL, THYROIDECTOMY:  - Papillary thyroid carcinoma, follicular variant, 1.5 cm.  - Nodular hyperplasia (multinodular goiter).  - Margins not involved.  - See oncology table.   COMMENT:  A.  The parathyroid specimen shows irregular nests of hypercellular  parathyroid tissue with intervening bands of fibrous tissue.  There is  no necrosis, significant atypia or mitotic activity.   B. ONCOLOGY TABLE:  THYROID GLAND, CARCINOMA: Resection  Procedure: Thyroidectomy.  Tumor Focality: Unifocal.  Tumor Site: Right  lobe.  Tumor Size: 1.5 cm, glass slide measurement.  Histologic Type: Papillary thyroid carcinoma, follicular variant.  Angioinvasion: Not identified.  Lymphatic Invasion: Not identified.  Extrathyroidal Extension: Not identified.  Margin Status: All margins negative for carcinoma.       Regional Lymph Node Status: No lymph nodes submitted.  Pathologic Stage Classification (pTNM, AJCC 8th Edition): pT1b, pN not  assigned.  Representative Tumor Block: B1 and B2  Comment(s): The thyroid shows multiple hyperplastic nodules and in the  right lobe there is a 1.5 cm nodule with follicular architecture and  nuclear features of papillary thyroid carcinoma.   Thyroglobulin levels are: Lab Results  Component Value Date   THYROGLB 0.2 (L) 05/23/2023   THYROGLB 0.4 (L) 06/28/2022   THYROGLB 0.3 (L) 06/26/2021    Lab Results  Component Value Date   THGAB <1 05/23/2023   THGAB <1 06/28/2022   THGAB <1 06/26/2021   She is on levothyroxine 100 mcg daily (dose increased 06/2022): - in am - fasting - at least 30 min from b'fast - no calcium - no iron - stopped multivitamins - no PPIs - not on Biotin  08/23/2022: TSH 5.29 Lab Results  Component Value Date   TSH 0.37 08/24/2022   TSH 5.29 06/28/2022   TSH 1.56 06/26/2021   Reviewed pertinent labs:  Lab Results  Component Value Date   CALCIUM 10.8 (H) 06/28/2022   PTH 49 11/02/2021  03/28/2021: PTH 98, calcium 10.2  08/18/2020: Calcium 11.2, albumin 4.3, magnesium 2.3 BUN/creatinine 35/1.53, GFR 38, glucose 153 hemoglobin 10.1  06/10/2018: Glucose 89, BUN/creatinine 21/1.19, EGFR 54 Calcium 11.8, corrected 11.7 (8.6-10.3)  Reviewed vitamin D levels: Lab Results  Component Value Date   VD25OH 32.54 06/28/2022   VD25OH 33.29 11/02/2021   VD25OH 38.21 06/26/2021   VD25OH 27.54 (L) 04/09/2017   VD25OH 30.17 04/09/2016  VD25OH 30.37 10/11/2015   VD25OH 48.75 03/29/2015   She was on 1000 units vitamin D daily and calcium  220 mg daily in MVI >> we stopped multivitamins and I advised her to start 2000 units vitamin D daily.  At last visit she was off and I again advised her to restart.  She did not as she mentions that she tried to stay out in the sun.  24h Urine calcium was at the ULN.  She has osteopenia per review of her DXA scan report from 08/2018.  Pt denies: - feeling nodules in neck - hoarseness - dysphagia - choking  DM2:  Reviewed HbA1c levels: 08/24/2022: HbA1c 7.1% Lab Results  Component Value Date   HGBA1C 7.1 (A) 06/28/2022   HGBA1C 8.2 (A) 11/02/2021   HGBA1C 6.9 (A) 06/26/2021  08/06/2017: HbA1c 8.8%  Patient is  on: -  stopped before last visit, could not remember when or why - Novolog 70/30:: - 30 units before b'fast ... >> 20 units >> 20-24 units 15 min before b'fast - 20 units before dinner ... >> 20 units  - at bedtime >> 20-24 units 15 min before dinner  Pt is not checking CBGs now. From before: - 11-11:30 am:  74-140 >> 130-140, 170 >> 95-269 >> 140-249  - before lunch: n/c >> 196 >> n/c - 2h after lunch: n/c >> 155, 351 (pancakes) >> n/c - before dinner: n/c >> 185-190 >> n/c - 2h after dinner: 150s >> 153-204 >> 82, 90 >> 135-140 >> n/c  - bedtime: n/c >> 96 >> n/c - nighttime:  occasionally 55 x3 >> n/c >> 130-140 >> n/c Lowest sugar was 40s >>... 48 >> 74 >> 95 >> 100; she has hypoglycemia awareness in the 60s. Highest sugar was 400 >> 249.  Glucometer: Accuchek Aviva  Pt's meals are: - Brunch: 7 layer salad, ham, [place]; cereal; egg + bacon + apple butter + OJ; fruit cocktail - Dinner: sandwich with meat; bowl of jello - Snacks: icecream, cookies, potato chips - after dinner, at 12 am-1 am  -+ CKD-seeing nephrology-Dr. Malen Gauze, last BUN/creatinine:  08/24/2022: GFR 26.15, creatinine 1.84, glucose 115 Lab Results  Component Value Date   BUN 26 (H) 06/28/2022   BUN 31 (H) 03/07/2021   CREATININE 1.84 (H) 06/28/2022   CREATININE 1.74 (H) 03/07/2021   Lab  Results  Component Value Date   MICRALBCREAT 0.5 06/28/2022  On Olmesartan 40.  -+ HL; lipids not available for review: 08/24/2022: 134/122/36/79 Lab Results  Component Value Date   CHOL 134 06/28/2022   HDL 30.10 (L) 06/28/2022   LDLCALC 79 06/28/2022   TRIG 122.0 06/28/2022   CHOLHDL 4 06/28/2022   On Pravastatin 80.  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 Dimas Millin.  -She denies numbness and tingling in her feet.  Last foot exam 06/28/2022.  No family history of diabetes.  ROS: + see HPI  I reviewed pt's medications, allergies, PMH, social hx, family hx, and changes were documented in the history of present illness. Otherwise, unchanged from my initial visit note.  Past Medical History:  Diagnosis Date   CKD (chronic kidney disease), stage III (HCC)    Diabetes mellitus without complication (HCC)    High cholesterol    Hypertension    Nontoxic goiter    Symptomatic anemia 02/04/2017   Past Surgical History:  Procedure Laterality Date   ABDOMINAL HYSTERECTOMY     COLONOSCOPY  WITH PROPOFOL N/A 02/05/2017   Procedure: COLONOSCOPY WITH PROPOFOL;  Surgeon: Sherrilyn Rist, MD;  Location: [facility] ENDOSCOPY;  Service: Endoscopy;  Laterality: N/A;   ESOPHAGOGASTRODUODENOSCOPY (EGD) WITH PROPOFOL N/A 02/05/2017   Procedure: ESOPHAGOGASTRODUODENOSCOPY (EGD) WITH PROPOFOL;  Surgeon: Sherrilyn Rist, MD;  Location: [facility] ENDOSCOPY;  Service: Endoscopy;  Laterality: N/A;   PARATHYROIDECTOMY Left 03/06/2021   Procedure: LEFT SUPERIOR PARATHYROIDECTOMY;  Surgeon: Darnell Level, MD;  Location: [facility] ORS;  Service: General;  Laterality: Left;   REPLACEMENT TOTAL KNEE Right    THYROIDECTOMY N/A 03/06/2021   Procedure: TOTAL THYROIDECTOMY;  Surgeon: Darnell Level, MD;  Location: [facility] ORS;  Service: General;  Laterality: N/A;   Social History   Socioeconomic History   Marital status: Married    Spouse name: Not on file   Number of  children: 1   Years of education: Not on file   Highest education level: Not on file  Occupational History   Occupation: retired  Tobacco Use   Smoking status: Never   Smokeless tobacco: Never  Vaping Use   Vaping status: Never Used  Substance and Sexual Activity   Alcohol use: Yes    Comment: wine occasional per pt.    Drug use: No    Types: Marijuana    Comment: Previously smoked. Quit in 1981   Sexual activity: Not on file  Other Topics Concern   Not on file  Social History Narrative   Not on file   Social Determinants of Health   Financial Resource Strain: Not on file  Food Insecurity: Not on file  Transportation Needs: Not on file  Physical Activity: Not on file  Stress: Not on file  Social Connections: Not on file  Intimate Partner Violence: Not on file   Current Outpatient Medications on File Prior to Visit  Medication Sig Dispense Refill   albuterol (VENTOLIN HFA) 108 (90 Base) MCG/ACT inhaler Inhale 2 puffs into the lungs every 6 (six) hours as needed for wheezing or shortness of breath.     aspirin EC 81 MG tablet Take 81 mg by mouth daily.     Aspirin-Acetaminophen-Caffeine (GOODY HEADACHE PO) Take 1 packet by mouth daily as needed (pain/headache).     BD PEN NEEDLE NANO 2ND GEN 32G X 4 MM MISC 1 EACH BY DOES NOT APPLY ROUTE IN THE MORNING AND AT BEDTIME. USE WITH INSULIN PEN 2 TIMES A DAY.DUE FOR APPOINTMENT 200 each 3   Blood Glucose Monitoring Suppl (ACCU-CHEK GUIDE ME) w/Device KIT Use as advised 1 kit 0   budesonide-formoterol (SYMBICORT) 160-4.5 MCG/ACT inhaler Inhale 2 puffs into the lungs 2 (two) times daily as needed (wheezing or shortness of breath).     chlorthalidone (HYGROTON) 25 MG tablet Take 25 mg by mouth daily. 1 tablet three times a week     Cholecalciferol (VITAMIN D) 50 MCG (2000 UT) tablet Take 2,000 Units by mouth daily.     COVID-19 mRNA bivalent vaccine, Pfizer, (PFIZER COVID-19 VAC BIVALENT) injection Inject into the muscle. 0.3 mL 0    Cyanocobalamin 1500 MCG TBDP Take 1,500 mcg by mouth daily.     ELDERBERRY PO Take 100 mg by mouth daily.     glucose blood (ACCU-CHEK AVIVA PLUS) test strip USE AS INSTRUCTED 3-4 TIMES A DAY 400 strip 3   hydrALAZINE (APRESOLINE) 100 MG tablet Take 100 mg by mouth 3 (three) times daily.     insulin aspart protamine - aspart (NOVOLOG MIX 70/30 FLEXPEN) (70-30) 100 UNIT/ML FlexPen INJECT  20-24 UNITS UNDER SKIN 2X A DAY BEFORE MEALS 45 mL 0   insulin NPH-regular Human (NOVOLIN 70/30 RELION) (70-30) 100 UNIT/ML injection Inject 15-20 Units into the skin 2 (two) times daily before a meal. (Patient not taking: Reported on 11/02/2021) 20 mL 11   Insulin Syringe-Needle U-100 (B-D INSULIN SYRINGE) 31G X 5/16" 0.3 ML MISC Use 2-3x a day 200 each 3   lactulose (CHRONULAC) 10 GM/15ML solution Take 15-30 cc daily as needed for bowels (Patient taking differently: Take 10-20 g by mouth daily as needed for moderate constipation.) 236 mL 11   latanoprost (XALATAN) 0.005 % ophthalmic solution Place 1 drop into both eyes at bedtime.     levothyroxine (SYNTHROID) 100 MCG tablet TAKE 1 TABLET BY MOUTH EVERY DAY 90 tablet 1   metFORMIN (GLUCOPHAGE) 500 MG tablet TAKE 1 TABLET BY MOUTH TWICE A DAY 60 tablet 2   metoprolol tartrate (LOPRESSOR) 100 MG tablet Take 100 mg by mouth 2 (two) times daily.     Multiple Vitamins-Minerals (CENTRUM SILVER ADULT 50+) TABS Take 1 tablet by mouth daily.     olmesartan (BENICAR) 40 MG tablet Take 40 mg by mouth daily.     pravastatin (PRAVACHOL) 80 MG tablet Take 80 mg by mouth daily.  5   Propylene Glycol 0.6 % SOLN Place 1 drop into both eyes daily as needed (swelling or redness).     traMADol (ULTRAM) 50 MG tablet Take 1-2 tablets (50-100 mg total) by mouth every 6 (six) hours as needed for moderate pain. 15 tablet 0   No current facility-administered medications on file prior to visit.   Allergies  Allergen Reactions   Ace Inhibitors Cough   Family History  Problem Relation  Age of Onset   Stroke Mother    Cancer Father        pts states cancer in back   Lung cancer Sister    Heart disease Sister    Cancer Brother        "   Colon cancer Neg Hx    PE: BP 120/70   Pulse 62   Ht 5\' 6"  (1.676 m)   Wt 188 lb 6.4 oz (85.5 kg)   BMI 30.41 kg/m  Wt Readings from Last 3 Encounters:  05/23/23 188 lb 6.4 oz (85.5 kg)  12/31/22 187 lb (84.8 kg)  06/28/22 191 lb 9.6 oz (86.9 kg)   Constitutional: overweight, in NAD Eyes:  EOMI, no exophthalmos ENT: no neck masses, no cervical lymphadenopathy Cardiovascular: RRR, No MRG Respiratory: CTA B Musculoskeletal: no deformities Skin:no rashes Neurological: no tremor with outstretched hands  ASSESSMENT: 1. DM2, insulin-dependent, uncontrolled, with complications - CKD stage 3  2. Primary  HPTH  3. Vitamin D deficiency  4.  Follicular variant of papillary thyroid cancer  5.  Postsurgical hypothyroidism  PLAN:  1. Patient with uncontrolled type 2 diabetes, on premixed insulin regimen.  At last visit, she came off metformin but could not remember why.  HbA1c was slightly better, at 7.1%, decreased from 8.2%.  At that time, sugars are higher than expected from the HbA1c and she was thinking that her meter may not have been accurate.  I advised her to get another meter and sent a prescription to her pharmacy.  We checked her blood sugar in the office and this was 145.  She did report eating candy corn and other sweets and I strongly advised her to stop.  I also recommended to check blood sugars at different times of  the day, rotating check times.  Due to the improvement in the HbA1c, we did not change her regimen.  Another HbA1c was stable 9 months ago, at 7.1%. -At today's visit, she is not checking blood sugars.  She is many sweets and also eats [place] fries, which are her favorite food, every day.  She also eats other fried foods a daily basis.  She mentions that she felt that her diabetes is doing very well, but  she feels great.  We discussed that these are not always correlated.  I strongly advised her to start checking blood sugars, and I sent a prescription for the freestyle libre CGM to her pharmacy.  I am hoping that she can get this.  In the meantime, I advised her to increase her NovoLog doses but she absolutely needs to change her diet follow-up to gain control of her diabetes. - I suggested to:  Patient Instructions  Please stop at the lab.  Please continue Levothyroxine 100 mcg daily.  Take the thyroid hormone every day, with water, at least 30 minutes before breakfast, separated by at least 4 hours from: - acid reflux medications - calcium - iron - multivitamins  Increase Novolog 70/30: - 20-24 >> 26-28 units 15 min before b'fast - 20-24 >> 26-28 units 15 min before dinner  STOP FRENCH FRIES!!!  START CHECKING BLOOD SUGARS!  Please restart vitamin D 2000 units daily.  Please come back for a follow-up appointment in 2-3 months.  - we checked her HbA1c: 9.7% (higher) - advised to check sugars at different times of the day - 4x a day, rotating check times - advised for yearly eye exams >> she is UTD - return to clinic in 2-3 months  2. Primary  HPTH and 3. vitamin D deficiency -s/p left superior parathyroidectomy with resection of hypercellular parathyroid focus -She started to feel much better after surgery -At last check, ionized calcium was normal, at 5.5 -She has been lately on 2000 units vitamin D daily, but at last visit she was off the medication and could not remember when and why she stopped.  Her vitamin D level at that time was 32, at the lower limit of the target range.  I advised her to restart the 2000 units vitamin D supplement >> she did not... -We will recheck a vitamin D level today  4.  Follicular variant of papillary thyroid cancer -She has a history of multinodular goiter, with previous benign biopsies.  However, she had total thyroidectomy in 02/2021 and a  1.5 cm focus of follicular variant of PTC was found -We discussed that thyroid cancer is a slow-growing cancer with good prognosis. -We did not proceed with RAI ablation since her thyroid cancer focus was small and without high risk features -At last check, her thyroglobulin level was slightly higher, but still very low, at 0.4.  ATA antibodies are undetectable.  In the absence of RAI treatment, a certain amount of variability in thyroglobulin level is expected -Will recheck the thyroglobulin and ATA today -At last visit, I ordered a neck U/S but she did not have this yet.  Will Reorder This Today.  5.  Postsurgical hypothyroidism - latest thyroid labs reviewed with pt. >> normal: Lab Results  Component Value Date   TSH 0.37 08/24/2022  - she continues on LT4 100 mcg daily, dose increased at last visit - pt feels good on this dose. - we discussed about taking the thyroid hormone every day, with water, >30 minutes before  breakfast, separated by >4 hours from acid reflux medications, calcium, iron, multivitamins. Pt. is taking it correctly. - will check thyroid tests today: TSH and fT4 - If labs are abnormal, she will need to return for repeat TFTs in 1.5 months  Component     Latest Ref Rng 05/23/2023  Sodium     135 - 145 mEq/L 138   Potassium     3.5 - 5.1 mEq/L 4.2   Chloride     96 - 112 mEq/L 104   CO2     19 - 32 mEq/L 27   Glucose     70 - 99 mg/dL 161 (H)   BUN     6 - 23 mg/dL 40 (H)   Creatinine     0.40 - 1.20 mg/dL 0.96 (H)   Calcium     8.4 - 10.5 mg/dL 04.5 (H)   Total Protein     6.0 - 8.3 g/dL 6.7   Albumin     3.5 - 5.2 g/dL 3.8   AST     0 - 37 U/L 12   ALT     0 - 35 U/L 10   Alkaline Phosphatase     39 - 117 U/L 83   Total Bilirubin     0.2 - 1.2 mg/dL 0.4   TSH     4.09 - 8.11 uIU/mL 0.20 (L)   T4,Free(Direct)     0.60 - 1.60 ng/dL 9.14   VITD     78.29 - 100.00 ng/mL 25.27 (L)   Thyroglobulin Ab     < or = 1 IU/mL <1   Thyroglobulin      ng/mL 0.2 (L)   Comment --   Cholesterol     0 - 200 mg/dL 562   Triglycerides     0.0 - 149.0 mg/dL 130.8 (H)   HDL Cholesterol     >39.00 mg/dL 65.78 (L)   VLDL     0.0 - 40.0 mg/dL 46.9   LDL (calc)     0 - 99 mg/dL 629 (H)   Total CHOL/HDL Ratio 6   NonHDL 143.28   GFR     >60.00 mL/min 23.37 (L)    TSH is suppressed, will need to decrease the dose of LT4 to 88 mcg daily and recheck her TFTs in 1.5 months. Thyroglobulin level is decreasing and ATA antibodies are undetectable. Kidney function is low, possibly influencing the PTH level--corrected calcium 10.8.  Will continue to keep an eye on this. Vitamin D level is also slightly low.  I would recommend to start 1000 units vitamin D daily. LDL is above target, triglycerides slightly high HDL low -we already discussed about improving her diet by reducing fried foods, which she eats every day.  For now continue the same dose of pravastatin,  80 milligrams daily.  Neck U/S (05/29/2023): The thyroid gland is surgically absent. There is a small round hypoechoic focus in the inferior aspect of the right thyroid resection bed measuring 0.9 x 0.6 x 0.6 cm. Vascular pedicle is visualized on color Doppler imaging.   No additional abnormalities. No mass in the left thyroid resection bed.   IMPRESSION: Surgical changes of total thyroidectomy.   Small hypoechoic soft tissue nodule in the inferior aspect of the right resection bed measures up to 9 mm in diameter and demonstrates a vascular pedicle. This may represent a small amount of residual thyroid tissue or a reactive lymph node. Recommend attention on follow-up imaging.  Plan to repeat another neck ultrasound in a year.  No intervention needed for now.  Addendum: Received message from patient's nephrologist, Dr. Malen Gauze: Estanislado Emms, MD sent to Carlus Pavlov, MD  Good afternoon,  I saw our mutual patient recently and asked my office to route you her labs and my note.   Her Creatinine is 1.83, eGFR 28 which is stable CKD stage IV for her but her calcium is 11.0 and her PTH was 67.    She is s/p partial parathyroidectomy.  I know at one point was on vitamin D OTC but not sure if still taking.    Would it be worth reimaging her neck?    Thank you, Estanislado Emms  Will try to obtain a technetium sestamibi scan but may need exploratory parathyroidectomy afterwards.  Will think about referral to Dr. Lovie Macadamia at [facility] for IntraOp PTH measurement.  Carlus Pavlov, MD PhD [facility] Endocrinology

## 2023-05-27 LAB — THYROGLOBULIN LEVEL: Thyroglobulin: 0.2 ng/mL — ABNORMAL LOW

## 2023-05-27 LAB — THYROGLOBULIN ANTIBODY: Thyroglobulin Ab: <1 [IU]/mL (ref <=1)

## 2023-05-28 ENCOUNTER — Encounter: Payer: Self-pay | Admitting: Internal Medicine

## 2023-05-28 MED ORDER — LEVOTHYROXINE SODIUM 88 MCG PO TABS: 88.0000 ug | ORAL_TABLET | Freq: Every day | ORAL | 3 refills | Status: DC

## 2023-05-29 ENCOUNTER — Ambulatory Visit
Admission: RE | Admit: 2023-05-29 | Discharge: 2023-05-29 | Disposition: A | Discharge status: Home / Self Care | Payer: Medicare Other | Source: Ambulatory Visit | Attending: Internal Medicine | Admitting: Internal Medicine

## 2023-05-29 DIAGNOSIS — C73 Malignant neoplasm of thyroid gland: Secondary | ICD-10-CM

## 2023-06-04 ENCOUNTER — Other Ambulatory Visit: Payer: Self-pay

## 2023-06-04 MED ORDER — FREESTYLE LIBRE 3 READER DEVI: 0 refills | Status: AC

## 2023-06-04 NOTE — Telephone Encounter (Signed)
Requested Prescriptions   Signed Prescriptions Disp Refills   Continuous Glucose Receiver (FREESTYLE LIBRE 3 READER) DEVI 1 each 0    Sig: Use to check glucose continuously, change sensor every 15 days    Authorizing Provider: Carlus Pavlov    Ordering User: Pollie Meyer

## 2023-06-10 ENCOUNTER — Other Ambulatory Visit: Payer: Medicare Other

## 2023-06-17 ENCOUNTER — Other Ambulatory Visit: Payer: Self-pay | Admitting: Internal Medicine

## 2023-06-17 DIAGNOSIS — E1121 Type 2 diabetes mellitus with diabetic nephropathy: Secondary | ICD-10-CM

## 2023-06-18 ENCOUNTER — Encounter (HOSPITAL_COMMUNITY): Payer: Medicare Other

## 2023-06-19 ENCOUNTER — Other Ambulatory Visit: Payer: Self-pay

## 2023-06-19 DIAGNOSIS — Z794 Long term (current) use of insulin: Secondary | ICD-10-CM

## 2023-06-19 MED ORDER — BD PEN NEEDLE NANO 2ND GEN 32G X 4 MM MISC: 5 refills | Status: AC

## 2023-06-19 NOTE — Telephone Encounter (Signed)
Requested Prescriptions   Signed Prescriptions Disp Refills   Insulin Pen Needle (BD PEN NEEDLE NANO 2ND GEN) 32G X 4 MM MISC 200 each 5    Sig: 1 EACH BY DOES NOT APPLY ROUTE IN THE MORNING AND AT BEDTIME. USE WITH INSULIN PEN 2 TIMES A DAY.DUE FOR APPOINTMENT    Authorizing Provider: Carlus Pavlov    Ordering User: Pollie Meyer

## 2023-06-25 ENCOUNTER — Ambulatory Visit: Payer: Medicare Other | Admitting: Internal Medicine

## 2023-07-17 ENCOUNTER — Other Ambulatory Visit: Payer: Medicare Other

## 2023-07-17 DIAGNOSIS — E890 Postprocedural hypothyroidism: Secondary | ICD-10-CM

## 2023-07-17 LAB — TSH: TSH: 0.8 u[IU]/mL (ref 0.35–5.50)

## 2023-07-17 LAB — T4, FREE: Free T4: 1.22 ng/dL (ref 0.60–1.60)

## 2023-07-26 ENCOUNTER — Telehealth: Payer: Self-pay

## 2023-07-26 NOTE — Telephone Encounter (Signed)
Message from Dr. Elvera Lennox (copied and paste):Patient's nephrologist sent me her recent calcium and PTH levels which are high.  Can you please check with patient if she agrees to have another imaging of her parathyroid glands.  If so, please let me know so I can order this in nuclear medicine at [facility].  Ty!

## 2023-07-26 NOTE — Telephone Encounter (Signed)
LMTRC  JMiller,RMA 

## 2023-09-20 ENCOUNTER — Ambulatory Visit: Payer: Medicare Other | Admitting: Internal Medicine

## 2023-10-04 ENCOUNTER — Telehealth: Payer: Self-pay

## 2023-10-04 ENCOUNTER — Other Ambulatory Visit: Payer: Self-pay

## 2023-10-04 DIAGNOSIS — Z794 Long term (current) use of insulin: Secondary | ICD-10-CM

## 2023-10-04 MED ORDER — NOVOLOG MIX 70/30 FLEXPEN (70-30) 100 UNIT/ML ~~LOC~~ SUPN: PEN_INJECTOR | SUBCUTANEOUS | 0 refills | Status: DC

## 2023-10-04 NOTE — Telephone Encounter (Signed)
Requested Prescriptions   Signed Prescriptions Disp Refills   insulin aspart protamine - aspart (NOVOLOG MIX 70/30 FLEXPEN) (70-30) 100 UNIT/ML FlexPen 45 mL 0    Sig: Inject 20-24 units under skin 2x a day before meals    Authorizing Provider: Carlus Pavlov    Ordering User: Pollie Meyer

## 2023-10-22 ENCOUNTER — Ambulatory Visit: Payer: Medicare Other | Admitting: Internal Medicine

## 2023-10-28 ENCOUNTER — Encounter: Payer: Self-pay | Admitting: Internal Medicine

## 2023-10-28 ENCOUNTER — Ambulatory Visit (INDEPENDENT_AMBULATORY_CARE_PROVIDER_SITE_OTHER): Payer: Medicare Other | Admitting: Internal Medicine

## 2023-10-28 VITALS — BP 120/80 | HR 62 | Ht 66.0 in | Wt 190.2 lb

## 2023-10-28 DIAGNOSIS — E213 Hyperparathyroidism, unspecified: Secondary | ICD-10-CM | POA: Diagnosis not present

## 2023-10-28 DIAGNOSIS — E1121 Type 2 diabetes mellitus with diabetic nephropathy: Secondary | ICD-10-CM

## 2023-10-28 DIAGNOSIS — E890 Postprocedural hypothyroidism: Secondary | ICD-10-CM

## 2023-10-28 DIAGNOSIS — Z794 Long term (current) use of insulin: Secondary | ICD-10-CM | POA: Diagnosis not present

## 2023-10-28 DIAGNOSIS — C73 Malignant neoplasm of thyroid gland: Secondary | ICD-10-CM

## 2023-10-28 DIAGNOSIS — E559 Vitamin D deficiency, unspecified: Secondary | ICD-10-CM

## 2023-10-28 LAB — POCT GLYCOSYLATED HEMOGLOBIN (HGB A1C): Hemoglobin A1C: 7.9 % — AB (ref 4.0–5.6)

## 2023-10-28 NOTE — Patient Instructions (Addendum)
  Please continue Levothyroxine 88 mcg daily.  Take the thyroid hormone every day, with water, at least 30 minutes before breakfast, separated by at least 4 hours from: - acid reflux medications - calcium - iron - multivitamins  Please continue Novolog 70/30: - 26-28 units 15 min before b'fast - 26-28 units 15 min before dinner  START: - vitamin D 2000 units daily  Try to set an alarm on your phone to take the insulin.  Start checking th sensor with your phone.  Please come back for a follow-up appointment in 3-4 months.

## 2023-10-28 NOTE — Progress Notes (Signed)
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  Last visit 5 months ago.  Interim history: No increased urination, blurry vision, nausea, abdominal pain, constipation. At last visit she mentioned eating [place] fries and other fried foods every day and eating many sweets including candy. Since last visit, she had an elevated calcium and parathyroid hormone in 07/2023 in nephrologist office.  Dr. Malen Gauze feels that these are not related to CKD stage IV, since this is stable (eGFR 28) but to primary hyperparathyroidism.  Primary hyperparathyroidism and follicular variant of papillary thyroid cancer:  Reviewed history: FNA x3 in 12/2013: Benign  Technetium sestamibi parathyroid scan (08/2014) was nonlocalizing  Thyroid ultrasound (01/30/2021): Multinodular goiter with surveillance needed for right isthmic and right inferior nodules  Technetium sestamibi parathyroid scan (02/02/2021): Consistent with left superior parathyroid adenoma  FNA of the right mid thyroid nodule (02/22/2021): benign  Total thyroidectomy with left superior parathyroidectomy (03/06/2021): Pathology:  Left superior hypercellular parathyroid tissue with fibrosis Follicular variant of papillary thyroid cancer measuring 1.5 cm: A. PARATHYROID, LEFT SUPERIOR, PARATHYROIDECTOMY:  - Hypercellular parathyroid tissue associated with fibrosis.  - See comment.   B. THYROID, TOTAL, THYROIDECTOMY:  - Papillary thyroid carcinoma, follicular variant, 1.5 cm.  - Nodular hyperplasia (multinodular goiter).  - Margins not involved.  - See oncology table.   COMMENT:  A.  The parathyroid specimen shows irregular nests of hypercellular  parathyroid tissue with  intervening bands of fibrous tissue.  There is  no necrosis, significant atypia or mitotic activity.   B. ONCOLOGY TABLE:  THYROID GLAND, CARCINOMA: Resection  Procedure: Thyroidectomy.  Tumor Focality: Unifocal.  Tumor Site: Right lobe.  Tumor Size: 1.5 cm, glass slide measurement.  Histologic Type: Papillary thyroid carcinoma, follicular variant.  Angioinvasion: Not identified.  Lymphatic Invasion: Not identified.  Extrathyroidal Extension: Not identified.  Margin Status: All margins negative for carcinoma.       Regional Lymph Node Status: No lymph nodes submitted.  Pathologic Stage Classification (pTNM, AJCC 8th Edition): pT1b, pN not  assigned.  Representative Tumor Block: B1 and B2  Comment(s): The thyroid shows multiple hyperplastic nodules and in the  right lobe there is a 1.5 cm nodule with follicular architecture and  nuclear features of papillary thyroid carcinoma.   Neck U/S (05/29/2023): The thyroid gland is surgically absent. There is a small round hypoechoic focus in the inferior aspect of the right thyroid resection bed measuring 0.9 x 0.6 x 0.6 cm. Vascular pedicle is visualized on color Doppler imaging.   No additional abnormalities. No mass in the left thyroid resection bed.   IMPRESSION: Surgical changes of total thyroidectomy.   Small hypoechoic soft tissue nodule in the inferior aspect of the right resection bed measures up to 9 mm in diameter and demonstrates a vascular pedicle. This may represent a small amount of residual thyroid tissue or a reactive lymph node. Recommend attention on follow-up imaging.   Plan to repeat another neck ultrasound in a year.  No intervention needed for now. Thyroglobulin levels are: Lab Results  Component Value Date   THYROGLB 0.2 (L) 05/23/2023   THYROGLB 0.4 (L) 06/28/2022   THYROGLB 0.3 (L) 06/26/2021    Lab Results  Component Value Date   THGAB <1 05/23/2023   THGAB <1 06/28/2022   THGAB <1 06/26/2021  She is on levothyroxine 88  mcg daily (dose decreased 05/2023): - in am - fasting - at least 30 min from b'fast - no calcium - no iron - stopped multivitamins - no PPIs - not on Biotin  Reviewed TSH levels: Lab Results  Component Value Date   TSH 0.80 07/17/2023   TSH 0.20 (L) 05/23/2023   TSH 0.37 08/24/2022   TSH 5.29 06/28/2022   TSH 1.56 06/26/2021  08/23/2022: TSH 5.29  Reviewed pertinent labs:  Lab Results  Component Value Date   CAION 5.5 06/28/2022   CAION 5.69 (H) 11/02/2021   Lab Results  Component Value Date   CALCIUM 10.6 (H) 05/23/2023   PTH 49 11/02/2021  03/28/2021: PTH 98, calcium 10.2  08/18/2020: Calcium 11.2, albumin 4.3, magnesium 2.3 BUN/creatinine 35/1.53, GFR 38, glucose 153 hemoglobin 10.1  06/10/2018: Glucose 89, BUN/creatinine 21/1.19, EGFR 54 Calcium 11.8, corrected 11.7 (8.6-10.3)  Reviewed vitamin D levels: Lab Results  Component Value Date   VD25OH 25.27 (L) 05/23/2023   VD25OH 32.54 06/28/2022   VD25OH 33.29 11/02/2021   VD25OH 38.21 06/26/2021   VD25OH 27.54 (L) 04/09/2017   VD25OH 30.17 04/09/2016   VD25OH 30.37 10/11/2015   VD25OH 48.75 03/29/2015  She was on 1000 units vitamin D daily and calcium 220 mg daily in MVI >> we stopped multivitamins and I advised her to start 2000 units vitamin D daily.  I again advised her to start this at our visit from 05/2023 as she was not taking it. She is still not taking it.  24h Urine calcium was at the ULN.  She has osteopenia per review of her DXA scan report from 08/2018.  Pt denies: - feeling nodules in neck - hoarseness - dysphagia - choking  DM2:  Reviewed HbA1c levels: 05/23/2023: HbA1c 9.7% 08/24/2022: HbA1c 7.1% Lab Results  Component Value Date   HGBA1C 7.1 (A) 06/28/2022   HGBA1C 8.2 (A) 11/02/2021   HGBA1C 6.9 (A) 06/26/2021  08/06/2017: HbA1c 8.8%  Patient is  on: -  stopped before last visit, could not remember when or why - Novolog 70/30:: - 26-28  units 15 min before b'fast - 26-28 units 15 min before dinner  At last visit, she was not checking blood sugars.  Now checking >4x a day:  Prev. - 11-11:30 am:  130-140, 170 >> 95-269 >> 140-249  - before lunch: n/c >> 196 >> n/c - 2h after lunch: n/c >> 155, 351 (pancakes) >> n/c - before dinner: n/c >> 185-190 >> n/c - 2h after dinner: 150s >> 153-204 >> 82, 90 >> 135-140 >> n/c  - bedtime: n/c >> 96 >> n/c - nighttime:  occasionally 55 x3 >> n/c >> 130-140 >> n/c Lowest sugar was 40s >>...  100 >> 69 (not eating); she has hypoglycemia awareness in the 60s. Highest sugar was 400 >> 249 >> 300.  Glucometer: Accuchek Aviva  Pt's meals are: - Brunch: 7 layer salad, ham, [place]; cereal; egg + bacon + apple butter + OJ; fruit cocktail - Dinner: sandwich with meat; bowl of jello - Snacks: icecream, cookies, potato chips - after dinner, at 12 am-1 am  -+ CKD-seeing nephrology-Dr. Malen Gauze, last BUN/creatinine:  Lab Results  Component Value Date   BUN 40 (H) 05/23/2023   BUN 26 (H) 06/28/2022   CREATININE 2.01 (H) 05/23/2023   CREATININE 1.84 (H) 06/28/2022   Lab Results  Component Value Date   MICRALBCREAT 0.5 06/28/2022  On Olmesartan 40.  -+ HL; lipids not available for review:  Lab Results  Component Value Date   CHOL 171 05/23/2023   HDL 27.70 (L) 05/23/2023   LDLCALC 109 (H) 05/23/2023   TRIG 173.0 (H) 05/23/2023   CHOLHDL 6 05/23/2023   On Pravastatin 80.  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 Dimas Millin.  -She denies numbness and tingling in her feet.  Last foot exam 05/23/2023  No family history of diabetes.  ROS: + see HPI  I reviewed pt's medications, allergies, PMH, social hx, family hx, and changes were documented in the history of present illness. Otherwise, unchanged from my initial visit note.  Past Medical History:  Diagnosis Date   CKD (chronic kidney disease), stage III (HCC)    Diabetes  mellitus without complication (HCC)    High cholesterol    Hypertension    Nontoxic goiter    Symptomatic anemia 02/04/2017   Past Surgical History:  Procedure Laterality Date   ABDOMINAL HYSTERECTOMY     COLONOSCOPY WITH PROPOFOL N/A 02/05/2017   Procedure: COLONOSCOPY WITH PROPOFOL;  Surgeon: Sherrilyn Rist, MD;  Location: [facility];  Service: Endoscopy;  Laterality: N/A;   ESOPHAGOGASTRODUODENOSCOPY (EGD) WITH PROPOFOL N/A 02/05/2017   Procedure: ESOPHAGOGASTRODUODENOSCOPY (EGD) WITH PROPOFOL;  Surgeon: Sherrilyn Rist, MD;  Location: [facility];  Service: Endoscopy;  Laterality: N/A;   PARATHYROIDECTOMY Left 03/06/2021   Procedure: LEFT SUPERIOR PARATHYROIDECTOMY;  Surgeon: Darnell Level, MD;  Location: [facility];  Service: General;  Laterality: Left;   REPLACEMENT TOTAL KNEE Right    THYROIDECTOMY N/A 03/06/2021   Procedure: TOTAL THYROIDECTOMY;  Surgeon: Darnell Level, MD;  Location: [facility];  Service: General;  Laterality: N/A;   Social History   Socioeconomic History   Marital status: Married    Spouse name: Not on file   Number of children: 1   Years of education: Not on file   Highest education level: Not on file  Occupational History   Occupation: retired  Tobacco Use   Smoking status: Never   Smokeless tobacco: Never  Vaping Use   Vaping status: Never Used  Substance and Sexual Activity   Alcohol use: Yes    Comment: wine occasional per pt.    Drug use: No    Types: Marijuana    Comment: Previously smoked. Quit in 1981   Sexual activity: Not on file  Other Topics Concern   Not on file  Social History Narrative   Not on file   Social Drivers of Health   Financial Resource Strain: Not on file  Food Insecurity: Not on file  Transportation Needs: Not on file  Physical Activity: Not on file  Stress: Not on file  Social Connections: Not on file  Intimate Partner Violence: Not on file   Current Outpatient Medications on File Prior to Visit  Medication  Sig Dispense Refill   albuterol (VENTOLIN HFA) 108 (90 Base) MCG/ACT inhaler Inhale 2 puffs into the lungs every 6 (six) hours as needed for wheezing or shortness of breath.     aspirin EC 81 MG tablet Take 81 mg by mouth daily.     Aspirin-Acetaminophen-Caffeine (GOODY HEADACHE PO) Take 1 packet by mouth daily as needed (pain/headache).     Blood Glucose Monitoring Suppl (ACCU-CHEK GUIDE ME) w/Device KIT Use as advised 1 kit 0   budesonide-formoterol (SYMBICORT) 160-4.5 MCG/ACT inhaler Inhale 2 puffs into the lungs 2 (two) times daily as needed (wheezing or shortness of breath).  chlorthalidone (HYGROTON) 25 MG tablet Take 25 mg by mouth daily. 1 tablet three times a week     Cholecalciferol (VITAMIN D) 50 MCG (2000 UT) tablet Take 2,000 Units by mouth daily.     Continuous Glucose Receiver (FREESTYLE LIBRE 3 READER) DEVI Use to check glucose continuously, change sensor every 15 days 1 each 0   Continuous Glucose Sensor (FREESTYLE LIBRE 3 SENSOR) MISC 1 each by Does not apply route every 14 (fourteen) days. 6 each 3   COVID-19 mRNA bivalent vaccine, Pfizer, (PFIZER COVID-19 VAC BIVALENT) injection Inject into the muscle. 0.3 mL 0   Cyanocobalamin 1500 MCG TBDP Take 1,500 mcg by mouth daily.     ELDERBERRY PO Take 100 mg by mouth daily.     glucose blood (ACCU-CHEK AVIVA PLUS) test strip USE AS INSTRUCTED 3-4 TIMES A DAY 400 strip 3   hydrALAZINE (APRESOLINE) 100 MG tablet Take 100 mg by mouth 3 (three) times daily.     insulin aspart protamine - aspart (NOVOLOG MIX 70/30 FLEXPEN) (70-30) 100 UNIT/ML FlexPen Inject 20-24 units under skin 2x a day before meals 45 mL 0   insulin NPH-regular Human (NOVOLIN 70/30 RELION) (70-30) 100 UNIT/ML injection Inject 15-20 Units into the skin 2 (two) times daily before a meal. 20 mL 11   Insulin Pen Needle (BD PEN NEEDLE NANO 2ND GEN) 32G X 4 MM MISC 1 EACH BY DOES NOT APPLY ROUTE IN THE MORNING AND AT BEDTIME. USE WITH INSULIN PEN 2 TIMES A DAY.DUE FOR  APPOINTMENT 200 each 5   Insulin Syringe-Needle U-100 (B-D INSULIN SYRINGE) 31G X 5/16" 0.3 ML MISC Use 2-3x a day 200 each 3   lactulose (CHRONULAC) 10 GM/15ML solution Take 15-30 cc daily as needed for bowels (Patient taking differently: Take 10-20 g by mouth daily as needed for moderate constipation.) 236 mL 11   latanoprost (XALATAN) 0.005 % ophthalmic solution Place 1 drop into both eyes at bedtime.     levothyroxine (SYNTHROID) 88 MCG tablet Take 1 tablet (88 mcg total) by mouth daily. 45 tablet 3   metFORMIN (GLUCOPHAGE) 500 MG tablet TAKE 1 TABLET BY MOUTH TWICE A DAY 60 tablet 2   metoprolol tartrate (LOPRESSOR) 100 MG tablet Take 100 mg by mouth 2 (two) times daily.     montelukast (SINGULAIR) 10 MG tablet Take 10 mg by mouth daily.     Multiple Vitamins-Minerals (CENTRUM SILVER ADULT 50+) TABS Take 1 tablet by mouth daily.     olmesartan (BENICAR) 40 MG tablet Take 40 mg by mouth daily.     pravastatin (PRAVACHOL) 80 MG tablet Take 80 mg by mouth daily.  5   Propylene Glycol 0.6 % SOLN Place 1 drop into both eyes daily as needed (swelling or redness).     traMADol (ULTRAM) 50 MG tablet Take 1-2 tablets (50-100 mg total) by mouth every 6 (six) hours as needed for moderate pain. 15 tablet 0   No current facility-administered medications on file prior to visit.   Allergies  Allergen Reactions   Ace Inhibitors Cough   Family History  Problem Relation Age of Onset   Stroke Mother    Cancer Father        pts states cancer in back   Lung cancer Sister    Heart disease Sister    Cancer Brother        "   Colon cancer Neg Hx    PE: BP 120/80   Pulse 62 Comment: manual  Ht 5'  6" (1.676 m)   Wt 190 lb 3.2 oz (86.3 kg)   BMI 30.70 kg/m  Wt Readings from Last 3 Encounters:  10/28/23 190 lb 3.2 oz (86.3 kg)  05/23/23 188 lb 6.4 oz (85.5 kg)  12/31/22 187 lb (84.8 kg)   Constitutional: overweight, in NAD Eyes:  EOMI, no exophthalmos ENT: no neck masses, no cervical  lymphadenopathy Cardiovascular: RRR, No MRG Respiratory: CTA B Musculoskeletal: no deformities Skin:no rashes Neurological: no tremor with outstretched hands  ASSESSMENT: 1. DM2, insulin-dependent, uncontrolled, with complications - CKD stage 3  2. Primary  HPTH  3. Vitamin D deficiency  4.  Follicular variant of papillary thyroid cancer  5.  Postsurgical hypothyroidism  PLAN:  1. Patient with uncontrolled type 2 diabetes on premixed insulin regimen.  At last visit she had multiple dietary indiscretions, with many fried foods and sweets throughout the day.  She was also not checking blood sugars.  I strongly advised her to stop [place] fries and sweets and to try to check blood sugars consistently.  We also increased her NovoLog 70/30 insulin doses.  HbA1c at that time was very high, at 9.7%. -Since last visit, she obtained a CGM -checks with a receiver.  I advised her to check with a phone. CGM interpretation: -At today's visit, we reviewed her CGM downloads: It appears that 47% of values are in target range (goal >70%), while 52% are higher than 180 (goal <25%), and 1% are lower than 70 (goal <4%).  The calculated average blood sugar is 192.  The projected HbA1c for the next 3 months (GMI) is 7.9%. -Reviewing the CGM trends, sugars are fluctuating closer to the target range, with values at or close to goal in the first half of the day but then increasing significantly especially after lunch and remaining elevated until approximately 2 AM when they start to improve.  Upon questioning, she started to reduce fried foods since last visit but she still has dietary indiscretions (today she had frosted cereals).  Also, she forgets to take the NovoLog doses before meals.  She is busy taking care of her husband.  We discussed about setting alarms on her phone to remember to take the insulin.  For now, I did not recommend a change in doses based on the improvement in HbA1c (see below). - I suggested  to:  Patient Instructions   Please continue Levothyroxine 88 mcg daily.  Take the thyroid hormone every day, with water, at least 30 minutes before breakfast, separated by at least 4 hours from: - acid reflux medications - calcium - iron - multivitamins  Please continue Novolog 70/30: - 26-28 units 15 min before b'fast - 26-28 units 15 min before dinner  START: - vitamin D 2000 units daily  Try to set an alarm on your phone to take the insulin.  Start checking th sensor with your phone.  Please come back for a follow-up appointment in 3-4 months.  - we checked her HbA1c: 7.9% (lower) - advised to check sugars at different times of the day - 2x a day, rotating check times - advised for yearly eye exams >> she is UTD - return to clinic in 3-4 months  2. Primary  HPTH and 3. vitamin D deficiency -s/p left superior parathyroidectomy with resection of hypercellular parathyroid focus -She felt much better after the surgery -However, unfortunately, calcium has fluctuated but lately has been elevated, 11, associated with a nonsuppressed parathyroid hormone, 67,  per records from nephrology 07/2023 -At last  visit vitamin D level was slightly low, and I again advised her to try to start vitamin D 2000 units daily. She did not... Does mention that she has it at home and I strongly advised her to start. -At today's visit, I plans to recheck her ionized calcium, vitamin D, PTH today.  We also discussed about a possible referral back to Dr. Gerrit Friends but she absolutely refuses to be intervention for now.  We did discuss about the risks of persistent hypercalcemia including kidney stones and osteoporosis, but for now, she wants to forego any intervention.  Will readdress at next visit.  4.  Follicular variant of papillary thyroid cancer -She has a history of multinodular goiter, with previous benign biopsies.  However, she had total thyroidectomy in 02/2021 and a 1.5 cm focus of follicular variant  of PTC was found -We did not proceed with RAI ablation since her thyroid cancer focus was small and without high risk features -At last check, her thyroglobulin level was slightly higher, but still very low, at 0.4.  ATA antibodies are undetectable.  In the absence of RAI treatment, a certain amount of variability in thyroglobulin level is expected -Latest thyroglobulin improved to 0.2, while ATA's were undetectable -After last visit we checked a neck ultrasound and this showed a small hypoechoic soft tissue nodule in the inferior right thyroid resection bed consistent with a small amount of residual thyroid tissue or reactive lymph node.  This had a distinct vascular pedicle.  At that time we discussed about repeating the neck ultrasound in a year.  However, I wonder if this nodule is not actually a parathyroid adenoma.  5.  Postsurgical hypothyroidism - latest thyroid labs reviewed with pt. >> normal: Lab Results  Component Value Date   TSH 0.80 07/17/2023  - she continues on LT4 88 mcg daily, decreased at last visit - pt feels good on this dose. - we discussed about taking the thyroid hormone every day, with water, >30 minutes before breakfast, separated by >4 hours from acid reflux medications, calcium, iron, multivitamins. Pt. is taking it correctly.  Carlus Pavlov, MD PhD [facility] Endocrinology

## 2024-01-29 ENCOUNTER — Other Ambulatory Visit: Payer: Self-pay | Admitting: Internal Medicine

## 2024-02-25 ENCOUNTER — Encounter: Payer: Self-pay | Admitting: Internal Medicine

## 2024-02-25 ENCOUNTER — Ambulatory Visit (INDEPENDENT_AMBULATORY_CARE_PROVIDER_SITE_OTHER): Payer: Medicare Other | Admitting: Internal Medicine

## 2024-02-25 VITALS — BP 138/80 | HR 84 | Ht 66.0 in | Wt 187.0 lb

## 2024-02-25 DIAGNOSIS — C73 Malignant neoplasm of thyroid gland: Secondary | ICD-10-CM | POA: Diagnosis not present

## 2024-02-25 DIAGNOSIS — E890 Postprocedural hypothyroidism: Secondary | ICD-10-CM

## 2024-02-25 DIAGNOSIS — E1121 Type 2 diabetes mellitus with diabetic nephropathy: Secondary | ICD-10-CM | POA: Diagnosis not present

## 2024-02-25 DIAGNOSIS — Z794 Long term (current) use of insulin: Secondary | ICD-10-CM | POA: Diagnosis not present

## 2024-02-25 DIAGNOSIS — E559 Vitamin D deficiency, unspecified: Secondary | ICD-10-CM | POA: Diagnosis not present

## 2024-02-25 DIAGNOSIS — E213 Hyperparathyroidism, unspecified: Secondary | ICD-10-CM | POA: Diagnosis not present

## 2024-02-25 LAB — POCT GLYCOSYLATED HEMOGLOBIN (HGB A1C): Hemoglobin A1C: 8.4 % — AB (ref 4.0–5.6)

## 2024-02-25 MED ORDER — LEVOTHYROXINE SODIUM 88 MCG PO TABS: 88.0000 ug | ORAL_TABLET | Freq: Every day | ORAL | 1 refills | Status: AC

## 2024-02-25 NOTE — Addendum Note (Signed)
 Addended by: Vernon Goodpasture on: 02/25/2024 03:56 PM   Modules accepted: Orders

## 2024-02-25 NOTE — Patient Instructions (Addendum)
 Please continue Levothyroxine  88 mcg daily.  Take the thyroid  hormone every day, with water, at least 30 minutes before breakfast, separated by at least 4 hours from: - acid reflux medications - calcium  - iron  - multivitamins  Please take Novolog  70/30 15 min before the 2 meals: - 30 units 15 min before b'fast - 26 units 15 min before dinner  Please stop Metformin .  Try to reduce popcorn.  NO JUICE, REGULAR SODA OR SWEET TEA!  Please come back for a follow-up appointment in 3 months.

## 2024-02-25 NOTE — Progress Notes (Signed)
 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  Last visit 4 months ago.  Interim history: + increased urination, + blurry vision, nausea, abdominal pain, constipation. She continues to have dietary indiscretions.  At last visit she mentioned eating [place] fries and other fried foods every day and eating many sweets including candy.  At today's visit she mentions eating popcorn every night and drinking juice. In 29-Dec-2023, her only son died. Her husband is very sick and she is the caregiver.  There are  PRIMARY HYPERPARATHYROIDISM  and FOLLICULAR VARIANT OF PAPILLARY THYROID  CANCER:  Reviewed history: FNA x3 in 12/2013: Benign  Technetium sestamibi parathyroid  scan (08/2014) was nonlocalizing  Thyroid  ultrasound (01/30/2021): Multinodular goiter with surveillance needed for right isthmic and right inferior nodules  Technetium sestamibi parathyroid  scan (02/02/2021): Consistent with left superior parathyroid  adenoma  FNA of the right mid thyroid  nodule (02/22/2021): benign  Total thyroidectomy with left superior parathyroidectomy (03/06/2021): Pathology:  Left superior hypercellular parathyroid  tissue with fibrosis Follicular variant of papillary thyroid  cancer measuring 1.5 cm: A. PARATHYROID , LEFT SUPERIOR, PARATHYROIDECTOMY:  - Hypercellular parathyroid  tissue associated with fibrosis.  - See comment.   B. THYROID , TOTAL, THYROIDECTOMY:  - Papillary thyroid  carcinoma, follicular variant, 1.5 cm.  - Nodular hyperplasia (multinodular goiter).  - Margins not involved.  - See oncology table.   COMMENT:  A.  The parathyroid  specimen shows irregular nests of hypercellular  parathyroid  tissue with intervening  bands of fibrous tissue.  There is  no necrosis, significant atypia or mitotic activity.   B. ONCOLOGY TABLE:  THYROID  GLAND, CARCINOMA: Resection  Procedure: Thyroidectomy.  Tumor Focality: Unifocal.  Tumor Site: Right lobe.  Tumor Size: 1.5 cm, glass slide measurement.  Histologic Type: Papillary thyroid  carcinoma, follicular variant.  Angioinvasion: Not identified.  Lymphatic Invasion: Not identified.  Extrathyroidal Extension: Not identified.  Margin Status: All margins negative for carcinoma.       Regional Lymph Node Status: No lymph nodes submitted.  Pathologic Stage Classification (pTNM, AJCC 8th Edition): pT1b, pN not  assigned.  Representative Tumor Block: B1 and B2  Comment(s): The thyroid  shows multiple hyperplastic nodules and in the  right lobe there is a 1.5 cm nodule with follicular architecture and  nuclear features of papillary thyroid  carcinoma.   Neck U/S (05/29/2023): The thyroid  gland is surgically absent. There is a small round hypoechoic focus in the inferior aspect of the right thyroid  resection bed measuring 0.9 x 0.6 x 0.6 cm. Vascular pedicle is visualized on color Doppler imaging.   No additional abnormalities. No mass in the left thyroid  resection bed.   IMPRESSION: Surgical changes of total thyroidectomy.   Small hypoechoic soft tissue nodule in the inferior aspect of the right resection bed measures up to 9 mm in diameter and demonstrates a vascular pedicle. This may represent a small amount of residual thyroid  tissue or a reactive lymph node. Recommend attention on follow-up imaging.   Thyroglobulin levels are: Lab Results  Component Value Date   THYROGLB 0.2 (L) 05/23/2023   THYROGLB 0.4 (L) 06/28/2022   THYROGLB 0.3 (L) 06/26/2021    Lab Results  Component Value Date   THGAB <1 05/23/2023   THGAB <1 06/28/2022   THGAB <1 06/26/2021   HYPOTHYROIDISM: She is on levothyroxine  88  mcg daily (dose decreased  05/2023): - in am -  fasting - at least 30 min from b'fast - no calcium  - no iron  - stopped multivitamins - no PPIs - not on Biotin  Reviewed TSH levels: Lab Results  Component Value Date   TSH 0.80 07/17/2023   TSH 0.20 (L) 05/23/2023   TSH 0.37 08/24/2022   TSH 5.29 06/28/2022   TSH 1.56 06/26/2021  08/23/2022: TSH 5.29  HYPERCALCEMIA: - Dr. Yvonnie Heritage feels that these are not related to CKD stage IV, since this is stable (eGFR 28) but to primary hyperparathyroidism.  Reviewed pertinent labs: Lab Results  Component Value Date   CAION 5.5 06/28/2022   CAION 5.69 (H) 11/02/2021  10/16/2023: Total calcium  10.32 (upper limit of normal 10.3) Lab Results  Component Value Date   CALCIUM  10.6 (H) 05/23/2023   PTH 49 11/02/2021  03/28/2021: PTH 98, calcium  10.2  08/18/2020: Calcium  11.2, albumin 4.3, magnesium 2.3 BUN/creatinine 35/1.53, GFR 38, glucose 153 hemoglobin 10.1  06/10/2018: Glucose 89, BUN/creatinine 21/1.19, EGFR 54 Calcium  11.8, corrected 11.7 (8.6-10.3)  VITAMIN D  DEFICIENCY:  Reviewed vitamin D  levels: 10/16/2023: Vitamin D  18.4 Lab Results  Component Value Date   VD25OH 25.27 (L) 05/23/2023   VD25OH 32.54 06/28/2022   VD25OH 33.29 11/02/2021   VD25OH 38.21 06/26/2021   VD25OH 27.54 (L) 04/09/2017   VD25OH 30.17 04/09/2016   VD25OH 30.37 10/11/2015   VD25OH 48.75 03/29/2015  She was on 1000 units vitamin D  daily and calcium  220 mg daily in MVI >> we stopped multivitamins and I repeatedly advised her to start 2000 units vitamin D  daily.  She mentions that she did start the vitamin D  supplement after the latest results - ? Dose.  24h Urine calcium  was at the ULN.  She has osteopenia per review of her DXA scan report from 08/2018.  Pt denies: - feeling nodules in neck - hoarseness - dysphagia - choking  DM2:  Reviewed HbA1c levels: Lab Results  Component Value Date   HGBA1C 7.9 (A) 10/28/2023   HGBA1C 7.1 (A) 06/28/2022   HGBA1C 8.2 (A) 11/02/2021  05/23/2023: HbA1c  9.7% 08/24/2022: HbA1c 7.1% 08/06/2017: HbA1c 8.8%  Patient is  on: - Novolog  70/30:: - 26-28 >> 26 units 15 min before b'fast - 26-28 >> 26 units 15 min before dinner She was previously on metformin  but came off in 2024 >> now back on it 1x a day.  She is checking blood sugars >4x a day with her CGM:  Previously:   Lowest sugar was 40s >>...  100 >> 69 (not eating); she has hypoglycemia awareness in the 60s. Highest sugar was 400 >> 249 >> 300.  Glucometer: Accuchek Aviva  Pt's meals are: - Brunch: 7 layer salad, ham, [place]; cereal; egg + bacon + apple butter + OJ; fruit cocktail - Dinner: sandwich with meat; bowl of jello - Snacks: icecream, cookies, potato chips - after dinner, at 12 am-1 am  -+ CKD-seeing nephrology-Dr. Yvonnie Heritage, last BUN/creatinine:  10/16/2023: 33/1.83, GFR 28, glucose 99 Lab Results  Component Value Date   BUN 40 (H) 05/23/2023   BUN 26 (H) 06/28/2022   CREATININE 2.01 (H) 05/23/2023   CREATININE 1.84 (H) 06/28/2022   Lab Results  Component Value Date   MICRALBCREAT 0.5 06/28/2022  On Olmesartan 40.  -+ HL: 10/16/2023: 166/135/29/113 Lab Results  Component Value Date   CHOL 171 05/23/2023   HDL 27.70 (L) 05/23/2023   LDLCALC 109 (H) 05/23/2023   TRIG 173.0 (H) 05/23/2023   CHOLHDL 6 05/23/2023  On Pravastatin  80.  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 Zaldivar.  -She denies numbness and tingling in her feet.  Last foot exam 05/23/2023  No family history of diabetes.  ROS: + see HPI  I reviewed pt's medications, allergies, PMH, social hx, family hx, and changes were documented in the history of present illness. Otherwise, unchanged from my initial visit note.  Past Medical History:  Diagnosis Date   CKD (chronic kidney disease), stage III (HCC)    Diabetes mellitus without complication (HCC)    High cholesterol    Hypertension    Nontoxic goiter    Symptomatic anemia  02/04/2017   Past Surgical History:  Procedure Laterality Date   ABDOMINAL HYSTERECTOMY     COLONOSCOPY WITH PROPOFOL  N/A 02/05/2017   Procedure: COLONOSCOPY WITH PROPOFOL ;  Surgeon: Albertina Hugger, MD;  Location: [facility];  Service: Endoscopy;  Laterality: N/A;   ESOPHAGOGASTRODUODENOSCOPY (EGD) WITH PROPOFOL  N/A 02/05/2017   Procedure: ESOPHAGOGASTRODUODENOSCOPY (EGD) WITH PROPOFOL ;  Surgeon: Albertina Hugger, MD;  Location: [facility];  Service: Endoscopy;  Laterality: N/A;   PARATHYROIDECTOMY Left 03/06/2021   Procedure: LEFT SUPERIOR PARATHYROIDECTOMY;  Surgeon: Oralee Billow, MD;  Location: [facility];  Service: General;  Laterality: Left;   REPLACEMENT TOTAL KNEE Right    THYROIDECTOMY N/A 03/06/2021   Procedure: TOTAL THYROIDECTOMY;  Surgeon: Oralee Billow, MD;  Location: [facility];  Service: General;  Laterality: N/A;   Social History   Socioeconomic History   Marital status: Married    Spouse name: Not on file   Number of children: 1   Years of education: Not on file   Highest education level: Not on file  Occupational History   Occupation: retired  Tobacco Use   Smoking status: Never   Smokeless tobacco: Never  Vaping Use   Vaping status: Never Used  Substance and Sexual Activity   Alcohol use: Yes    Comment: wine occasional per pt.    Drug use: No    Types: Marijuana    Comment: Previously smoked. Quit in 1981   Sexual activity: Not on file  Other Topics Concern   Not on file  Social History Narrative   Not on file   Social Drivers of Health   Financial Resource Strain: Not on file  Food Insecurity: Not on file  Transportation Needs: Not on file  Physical Activity: Not on file  Stress: Not on file  Social Connections: Not on file  Intimate Partner Violence: Not on file   Current Outpatient Medications on File Prior to Visit  Medication Sig Dispense Refill   albuterol  (VENTOLIN  HFA) 108 (90 Base) MCG/ACT inhaler Inhale 2 puffs into the lungs every 6  (six) hours as needed for wheezing or shortness of breath.     aspirin  EC 81 MG tablet Take 81 mg by mouth daily.     Aspirin -Acetaminophen -Caffeine (GOODY HEADACHE PO) Take 1 packet by mouth daily as needed (pain/headache).     Blood Glucose Monitoring Suppl (ACCU-CHEK GUIDE ME) w/Device KIT Use as advised 1 kit 0   budesonide -formoterol  (SYMBICORT ) 160-4.5 MCG/ACT inhaler Inhale 2 puffs into the lungs 2 (two) times daily as needed (wheezing or shortness of breath).     chlorthalidone (HYGROTON) 25 MG tablet Take 25 mg by mouth daily. 1 tablet three times a week     Cholecalciferol (VITAMIN D ) 50 MCG (2000 UT) tablet Take 2,000 Units by mouth daily.  Continuous Glucose Receiver (FREESTYLE LIBRE 3 READER) DEVI Use to check glucose continuously, change sensor every 15 days 1 each 0   Continuous Glucose Sensor (FREESTYLE LIBRE 3 SENSOR) MISC 1 each by Does not apply route every 14 (fourteen) days. 6 each 3   COVID-19 mRNA bivalent vaccine, Pfizer, (PFIZER COVID-19 VAC BIVALENT) injection Inject into the muscle. 0.3 mL 0   Cyanocobalamin 1500 MCG TBDP Take 1,500 mcg by mouth daily.     ELDERBERRY PO Take 100 mg by mouth daily.     glucose blood (ACCU-CHEK AVIVA PLUS) test strip USE AS INSTRUCTED 3-4 TIMES A DAY 400 strip 3   hydrALAZINE  (APRESOLINE ) 100 MG tablet Take 100 mg by mouth 3 (three) times daily.     insulin  aspart protamine - aspart (NOVOLOG  MIX 70/30 FLEXPEN) (70-30) 100 UNIT/ML FlexPen Inject 20-24 units under skin 2x a day before meals 45 mL 0   insulin  NPH-regular Human (NOVOLIN  70/30 RELION) (70-30) 100 UNIT/ML injection Inject 15-20 Units into the skin 2 (two) times daily before a meal. (Patient not taking: Reported on 10/28/2023) 20 mL 11   Insulin  Pen Needle (BD PEN NEEDLE NANO 2ND GEN) 32G X 4 MM MISC 1 EACH BY DOES NOT APPLY ROUTE IN THE MORNING AND AT BEDTIME. USE WITH INSULIN  PEN 2 TIMES A DAY.DUE FOR APPOINTMENT 200 each 5   Insulin  Syringe-Needle U-100 (B-D INSULIN  SYRINGE)  31G X 5/16 0.3 ML MISC Use 2-3x a day 200 each 3   lactulose  (CHRONULAC ) 10 GM/15ML solution Take 15-30 cc daily as needed for bowels (Patient taking differently: Take 10-20 g by mouth daily as needed for moderate constipation.) 236 mL 11   latanoprost  (XALATAN ) 0.005 % ophthalmic solution Place 1 drop into both eyes at bedtime.     levothyroxine  (SYNTHROID ) 88 MCG tablet TAKE 1 TABLET BY MOUTH EVERY DAY 90 tablet 0   metFORMIN  (GLUCOPHAGE ) 500 MG tablet TAKE 1 TABLET BY MOUTH TWICE A DAY 60 tablet 2   metoprolol  tartrate (LOPRESSOR ) 100 MG tablet Take 100 mg by mouth 2 (two) times daily.     montelukast (SINGULAIR) 10 MG tablet Take 10 mg by mouth daily.     Multiple Vitamins-Minerals (CENTRUM SILVER ADULT 50+) TABS Take 1 tablet by mouth daily.     olmesartan (BENICAR) 40 MG tablet Take 40 mg by mouth daily.     pravastatin  (PRAVACHOL ) 80 MG tablet Take 80 mg by mouth daily.  5   Propylene Glycol 0.6 % SOLN Place 1 drop into both eyes daily as needed (swelling or redness).     traMADol  (ULTRAM ) 50 MG tablet Take 1-2 tablets (50-100 mg total) by mouth every 6 (six) hours as needed for moderate pain. 15 tablet 0   No current facility-administered medications on file prior to visit.   Allergies  Allergen Reactions   Ace Inhibitors Cough   Family History  Problem Relation Age of Onset   Stroke Mother    Cancer Father        pts states cancer in back   Lung cancer Sister    Heart disease Sister    Colon cancer Neg Hx    PE: BP 138/80   Pulse 84   Ht 5' 6 (1.676 m)   Wt 187 lb (84.8 kg)   BMI 30.18 kg/m  Wt Readings from Last 3 Encounters:  02/25/24 187 lb (84.8 kg)  10/28/23 190 lb 3.2 oz (86.3 kg)  05/23/23 188 lb 6.4 oz (85.5 kg)   Constitutional: overweight,  in NAD Eyes:  EOMI, no exophthalmos ENT: no neck masses, no cervical lymphadenopathy Cardiovascular: RRR, No MRG Respiratory: CTA B Musculoskeletal: no deformities Skin:no rashes Neurological: no tremor with  outstretched hands Diabetic Foot Exam - Simple   Simple Foot Form Diabetic Foot exam was performed with the following findings: Yes 02/25/2024  3:24 PM  Visual Inspection No deformities, no ulcerations, no other skin breakdown bilaterally: Yes Sensation Testing Intact to touch and monofilament testing bilaterally: Yes Pulse Check Posterior Tibialis and Dorsalis pulse intact bilaterally: Yes Comments    ASSESSMENT: 1. DM2, insulin -dependent, uncontrolled, with complications - CKD stage 3  2. Primary  HPTH  3. Vitamin D  deficiency  4.  Follicular variant of papillary thyroid  cancer  5.  Postsurgical hypothyroidism  PLAN:  1. Patient with uncontrolled type 2 diabetes on a premixed insulin  regimen with improved diabetes control - HbA1c returned at 7.9% in 10/2023, decreased from 9.7% previously.  At that time, sugars were fluctuating closer to the target range, especially in the first part of the day, but then increasing significantly especially after lunch and remaining elevated until approximately 2 AM when they were starting to improve.  She still had dietary indiscretions (for example frosted cereals), but started to reduce fried foods.  She was also forgetting to take NovoLog  doses before meals as she was busy taking care of her husband.  We discussed about setting alarms on her phone to remember to take her insulin .  I did not change her insulin  doses.  She was checking her CGM with receiver and I recommended to start checking with the phone. CGM interpretation: -At today's visit, we reviewed her CGM downloads: It appears that 35% of values are in target range (goal >70%), while 65% are higher than 180 (goal <25%), and 0% are lower than 70 (goal <4%).  The calculated average blood sugar is 206.  The projected HbA1c for the next 3 months (GMI) is 8.2%. -Reviewing the CGM trends, sugars appear to be quite high, improving overnight but not quite to target and increases significantly after  breakfast and then again after dinner.  She mentions she relaxed her diet since last visit, and has not been eating consistent meals after the death of her son, but drinking juice, eating popcorn, and also frequently drinking Glucerna.  We discussed about the importance of improving diet, and having regular meals.  I also recommended to reduce popcorn and stop any sweet drinks.  Based on the pattern of her blood sugars, will increase her morning insulin  dose.  Upon questioning, she is taking her evening insulin  dose at bedtime, rather than before dinner.  I advised her to hold it 15 minutes before dinner.  Will not change the dose for now. - I suggested to:  Patient Instructions  Please continue Levothyroxine  88 mcg daily.  Take the thyroid  hormone every day, with water, at least 30 minutes before breakfast, separated by at least 4 hours from: - acid reflux medications - calcium  - iron  - multivitamins  Please take Novolog  70/30 15 min before the 2 meals: - 30 units 15 min before b'fast - 26 units 15 min before dinner  Please stop Metformin .  Try to reduce popcorn.  NO JUICE, REGULAR SODA OR SWEET TEA!  Please come back for a follow-up appointment in 3 months.  - we checked her HbA1c: 8.4% (higher) - advised to check sugars at different times of the day - 4x a day, rotating check times - advised for yearly eye exams >>  she is UTD - return to clinic in 3-4 months  2. Primary  HPTH and 3. vitamin D  deficiency -s/p left superior parathyroidectomy with resection of hypercellular parathyroid  focus - He felt much better after the surgery -However, unfortunately, calcium  has fluctuated but lately has been elevated, 11, associated with a nonsuppressed parathyroid  hormone, 67,  per records from nephrology 07/2023 - Vitamin D  level was previously low and I advised her to start 2000 units of vitamin D  but at last visit she was still off it.  The vitamin D  level was still low, I again recommended  to start 2000 units daily.  However, she had another vitamin D  level that was even lower, at 18.4 at last check by PCP in 10/2023.  She mentions she did start a vitamin D  gummy (? Dose) since then. Corrected calcium  obtained at that time was 10.32, with the upper limit of the normal range at 10.3, so with the very slight hypercalcemia - At last visit we discussed about a referral back to Dr. Sofia Dunn but she absolutely refused this.  We did discuss about the risks of persistent hypercalcemia including kidney stones and osteoporosis, but she wanted to forego any intervention. - Will continue to manage expectantly for now  4.  Follicular variant of papillary thyroid  cancer -She has a history of multinodular goiter, with previous benign biopsies.  However, she had total thyroidectomy in 02/2021 and a 1.5 cm focus of follicular variant of PTC was found -We did not proceed with RAI ablation since her thyroid  cancer focus was small and without high risk features -At last check, her thyroglobulin level was slightly higher, but still very low, at 0.4.  ATA antibodies are undetectable.  In the absence of RAI treatment, a certain amount of variability in thyroglobulin level is expected -Latest thyroglobulin improved to 0.2, while ATA's were undetectable -we checked a neck ultrasound (05/31/2023) and this showed a small hypoechoic soft tissue nodule in the inferior right thyroid  resection bed consistent with a small amount of residual thyroid  tissue or reactive lymph node.  This had a distinct vascular pedicle.  Plan to repeat the ultrasound at next visit.  However, I wonder if this nodule is not actually a parathyroid  adenoma.  5.  Postsurgical hypothyroidism - latest thyroid  labs reviewed with pt. >> normal: Lab Results  Component Value Date   TSH 0.80 07/17/2023  - she continues on LT4 88 mcg daily - pt feels good on this dose. - we discussed about taking the thyroid  hormone every day, with water, >30 minutes  before breakfast, separated by >4 hours from acid reflux medications, calcium , iron , multivitamins. Pt. is taking it correctly. - will recheck this at next visit  Emilie Harden, MD PhD [facility] Endocrinology

## 2024-02-28 LAB — LAB REPORT - SCANNED
EGFR: 30
PTH: 80

## 2024-04-27 ENCOUNTER — Other Ambulatory Visit: Payer: Self-pay | Admitting: Internal Medicine

## 2024-04-27 DIAGNOSIS — E1121 Type 2 diabetes mellitus with diabetic nephropathy: Secondary | ICD-10-CM

## 2024-05-23 ENCOUNTER — Other Ambulatory Visit: Payer: Self-pay | Admitting: Internal Medicine

## 2024-05-25 NOTE — Telephone Encounter (Signed)
 Refill request complete

## 2024-06-25 ENCOUNTER — Ambulatory Visit: Admitting: Internal Medicine

## 2024-06-25 NOTE — Progress Notes (Deleted)
 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  Last visit 4 months ago.  Interim history: + increased urination, + blurry vision, nausea, abdominal pain, constipation. She continues to have dietary indiscretions: previously eating [place] fries and other fried foods every day and eating many sweets including candy.  At last visit she was eating popcorn every night and drinking juice. She continues to have increased stress.  In 2023-12-04, her only son died. Her husband is very sick and she is the caregiver.    PRIMARY HYPERPARATHYROIDISM  and FOLLICULAR VARIANT OF PAPILLARY THYROID  CANCER:  Reviewed history: FNA x3 in 12/2013: Benign  Technetium sestamibi parathyroid  scan (08/2014) was nonlocalizing  Thyroid  ultrasound (01/30/2021): Multinodular goiter with surveillance needed for right isthmic and right inferior nodules  Technetium sestamibi parathyroid  scan (02/02/2021): Consistent with left superior parathyroid  adenoma  FNA of the right mid thyroid  nodule (02/22/2021): benign  Total thyroidectomy with left superior parathyroidectomy (03/06/2021): Pathology:  Left superior hypercellular parathyroid  tissue with fibrosis Follicular variant of papillary thyroid  cancer measuring 1.5 cm: A. PARATHYROID , LEFT SUPERIOR, PARATHYROIDECTOMY:  - Hypercellular parathyroid  tissue associated with fibrosis.  - See comment.   B. THYROID , TOTAL, THYROIDECTOMY:  - Papillary thyroid  carcinoma, follicular variant, 1.5 cm.  - Nodular hyperplasia (multinodular goiter).  - Margins not involved.  - See oncology table.   COMMENT:  A.  The parathyroid  specimen shows irregular nests of hypercellular  parathyroid  tissue with  intervening bands of fibrous tissue.  There is  no necrosis, significant atypia or mitotic activity.   B. ONCOLOGY TABLE:  THYROID  GLAND, CARCINOMA: Resection  Procedure: Thyroidectomy.  Tumor Focality: Unifocal.  Tumor Site: Right lobe.  Tumor Size: 1.5 cm, glass slide measurement.  Histologic Type: Papillary thyroid  carcinoma, follicular variant.  Angioinvasion: Not identified.  Lymphatic Invasion: Not identified.  Extrathyroidal Extension: Not identified.  Margin Status: All margins negative for carcinoma.       Regional Lymph Node Status: No lymph nodes submitted.  Pathologic Stage Classification (pTNM, AJCC 8th Edition): pT1b, pN not  assigned.  Representative Tumor Block: B1 and B2  Comment(s): The thyroid  shows multiple hyperplastic nodules and in the  right lobe there is a 1.5 cm nodule with follicular architecture and  nuclear features of papillary thyroid  carcinoma.   Neck U/S (05/29/2023): The thyroid  gland is surgically absent. There is a small round hypoechoic focus in the inferior aspect of the right thyroid  resection bed measuring 0.9 x 0.6 x 0.6 cm. Vascular pedicle is visualized on color Doppler imaging.   No additional abnormalities. No mass in the left thyroid  resection bed.   IMPRESSION: Surgical changes of total thyroidectomy.   Small hypoechoic soft tissue nodule in the inferior aspect of the right resection bed measures up to 9 mm in diameter and demonstrates a vascular pedicle. This may represent a small amount of residual thyroid  tissue or a reactive lymph node. Recommend attention on follow-up imaging.   Thyroglobulin levels are: Lab Results  Component Value Date   THYROGLB 0.2 (L) 05/23/2023   THYROGLB 0.4 (L) 06/28/2022   THYROGLB 0.3 (L) 06/26/2021    Lab Results  Component Value Date   THGAB <1 05/23/2023   THGAB <1 06/28/2022   THGAB <1 06/26/2021   HYPOTHYROIDISM: She is on levothyroxine  88  mcg daily (dose  decreased 05/2023): - in  am - fasting - at least 30 min from b'fast - no calcium  - no iron  - stopped multivitamins - no PPIs - not on Biotin  Reviewed TSH levels: Lab Results  Component Value Date   TSH 0.80 07/17/2023   TSH 0.20 (L) 05/23/2023   TSH 0.37 08/24/2022   TSH 5.29 06/28/2022   TSH 1.56 06/26/2021  08/23/2022: TSH 5.29  HYPERCALCEMIA: - Dr. Jerrye feels that these are not related to CKD stage IV, since this is stable (eGFR 28) but to primary hyperparathyroidism.  Reviewed pertinent labs: 02/27/2024: Total calcium  11, PTH 80 10/16/2023: Total calcium  10.32 (upper limit of normal 10.3) Lab Results  Component Value Date   CALCIUM  10.6 (H) 05/23/2023   PTH 80 02/28/2024  03/28/2021: PTH 98, calcium  10.2  08/18/2020: Calcium  11.2, albumin 4.3, magnesium 2.3 BUN/creatinine 35/1.53, GFR 38, glucose 153 hemoglobin 10.1  06/10/2018: Glucose 89, BUN/creatinine 21/1.19, EGFR 54 Calcium  11.8, corrected 11.7 (8.6-10.3)  Ionized calcium  levels: Lab Results  Component Value Date   CAION 5.5 06/28/2022   CAION 5.69 (H) 11/02/2021   VITAMIN D  DEFICIENCY:  Reviewed vitamin D  levels: 10/16/2023: Vitamin D  18.4 Lab Results  Component Value Date   VD25OH 25.27 (L) 05/23/2023   VD25OH 32.54 06/28/2022   VD25OH 33.29 11/02/2021   VD25OH 38.21 06/26/2021   VD25OH 27.54 (L) 04/09/2017   VD25OH 30.17 04/09/2016   VD25OH 30.37 10/11/2015   VD25OH 48.75 03/29/2015  She was on 1000 units vitamin D  daily and calcium  220 mg daily in MVI >> we stopped multivitamins and I repeatedly advised her to start 2000 units vitamin D  daily.  She mentions that she did start the vitamin D  supplement after the latest results - ? Dose.  24h Urine calcium  was at the ULN.  She has osteopenia per review of her DXA scan report from 08/2018.  Pt denies: - feeling nodules in neck - hoarseness - dysphagia - choking  DM2:  Reviewed HbA1c levels: Lab Results  Component Value Date   HGBA1C 8.4 (A) 02/25/2024    HGBA1C 7.9 (A) 10/28/2023   HGBA1C 7.1 (A) 06/28/2022  05/23/2023: HbA1c 9.7% 08/24/2022: HbA1c 7.1% 08/06/2017: HbA1c 8.8%  Patient is  on: - Novolog  70/30:: - 26-28 >> 26 >> 30 units 15 min before b'fast - 26-28 >> 26 units 15 min at bedtime >> moved before dinner She was previously on metformin  but came off in 2024 >> 1x a day >> advised her again to stop 02/2024.  She is checking blood sugars >4x a day with her CGM:  Previously:  Previously:   Lowest sugar was 40s >>...  100 >> 69 (not eating); she has hypoglycemia awareness in the 60s. Highest sugar was 400 >> 249 >> 300.  Glucometer: Accuchek Aviva  Pt's meals are: - Brunch: 7 layer salad, ham, [place]; cereal; egg + bacon + apple butter + OJ; fruit cocktail - Dinner: sandwich with meat; bowl of jello - Snacks: icecream, cookies, potato chips - after dinner, at 12 am-1 am  -+ CKD-seeing nephrology-Dr. Jerrye, last BUN/creatinine:  02/27/2024: 27/1.72, GFR 30, glucose 185 10/16/2023: 33/1.83, GFR 28, glucose 99 Lab Results  Component Value Date   BUN 40 (H) 05/23/2023   BUN 26 (H) 06/28/2022   CREATININE 2.01 (H) 05/23/2023   CREATININE 1.84 (H) 06/28/2022   No results found for: MICRALBCREAT On Olmesartan 40.  -+ HL: 05/07/2024: 149/125/27/100 10/16/2023: 166/135/29/113 Lab Results  Component Value Date   CHOL 171 05/23/2023  HDL 27.70 (L) 05/23/2023   LDLCALC 109 (H) 05/23/2023   TRIG 173.0 (H) 05/23/2023   CHOLHDL 6 05/23/2023   On Pravastatin  80.  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 Zaldivar.  -She denies numbness and tingling in her feet.  Last foot exam 02/25/2024.  No family history of diabetes.  ROS: + see HPI  I reviewed pt's medications, allergies, PMH, social hx, family hx, and changes were documented in the history of present illness. Otherwise, unchanged from my initial visit note.  Past Medical History:  Diagnosis Date    CKD (chronic kidney disease), stage III (HCC)    Diabetes mellitus without complication (HCC)    High cholesterol    Hypertension    Nontoxic goiter    Symptomatic anemia 02/04/2017   Past Surgical History:  Procedure Laterality Date   ABDOMINAL HYSTERECTOMY     COLONOSCOPY WITH PROPOFOL  N/A 02/05/2017   Procedure: COLONOSCOPY WITH PROPOFOL ;  Surgeon: Legrand Victory LITTIE DOUGLAS, MD;  Location: [facility];  Service: Endoscopy;  Laterality: N/A;   ESOPHAGOGASTRODUODENOSCOPY (EGD) WITH PROPOFOL  N/A 02/05/2017   Procedure: ESOPHAGOGASTRODUODENOSCOPY (EGD) WITH PROPOFOL ;  Surgeon: Legrand Victory LITTIE DOUGLAS, MD;  Location: [facility];  Service: Endoscopy;  Laterality: N/A;   PARATHYROIDECTOMY Left 03/06/2021   Procedure: LEFT SUPERIOR PARATHYROIDECTOMY;  Surgeon: Eletha Boas, MD;  Location: [facility];  Service: General;  Laterality: Left;   REPLACEMENT TOTAL KNEE Right    THYROIDECTOMY N/A 03/06/2021   Procedure: TOTAL THYROIDECTOMY;  Surgeon: Eletha Boas, MD;  Location: [facility];  Service: General;  Laterality: N/A;   Social History   Socioeconomic History   Marital status: Married    Spouse name: Not on file   Number of children: 1   Years of education: Not on file   Highest education level: Not on file  Occupational History   Occupation: retired  Tobacco Use   Smoking status: Never   Smokeless tobacco: Never  Vaping Use   Vaping status: Never Used  Substance and Sexual Activity   Alcohol use: Yes    Comment: wine occasional per pt.    Drug use: No    Types: Marijuana    Comment: Previously smoked. Quit in 1981   Sexual activity: Not on file  Other Topics Concern   Not on file  Social History Narrative   Not on file   Social Drivers of Health   Financial Resource Strain: Not on file  Food Insecurity: Not on file  Transportation Needs: Not on file  Physical Activity: Not on file  Stress: Not on file  Social Connections: Not on file  Intimate Partner Violence: Not on file   Current  Outpatient Medications on File Prior to Visit  Medication Sig Dispense Refill   albuterol  (VENTOLIN  HFA) 108 (90 Base) MCG/ACT inhaler Inhale 2 puffs into the lungs every 6 (six) hours as needed for wheezing or shortness of breath.     aspirin  EC 81 MG tablet Take 81 mg by mouth daily.     Aspirin -Acetaminophen -Caffeine (GOODY HEADACHE PO) Take 1 packet by mouth daily as needed (pain/headache).     Blood Glucose Monitoring Suppl (ACCU-CHEK GUIDE ME) w/Device KIT Use as advised 1 kit 0   budesonide -formoterol  (SYMBICORT ) 160-4.5 MCG/ACT inhaler Inhale 2 puffs into the lungs 2 (two) times daily as needed (wheezing or shortness of breath).     chlorthalidone (HYGROTON) 25 MG tablet Take 25 mg by mouth daily. 1  tablet three times a week     Cholecalciferol (VITAMIN D ) 50 MCG (2000 UT) tablet Take 2,000 Units by mouth daily.     Continuous Glucose Receiver (FREESTYLE LIBRE 3 READER) DEVI Use to check glucose continuously, change sensor every 15 days 1 each 0   Continuous Glucose Sensor (FREESTYLE LIBRE 3 SENSOR) MISC 1 EACH BY DOES NOT APPLY ROUTE EVERY 14 (FOURTEEN) DAYS. 6 each 2   COVID-19 mRNA bivalent vaccine, Pfizer, (PFIZER COVID-19 VAC BIVALENT) injection Inject into the muscle. 0.3 mL 0   Cyanocobalamin 1500 MCG TBDP Take 1,500 mcg by mouth daily.     ELDERBERRY PO Take 100 mg by mouth daily.     glucose blood (ACCU-CHEK AVIVA PLUS) test strip USE AS INSTRUCTED 3-4 TIMES A DAY 400 strip 3   hydrALAZINE  (APRESOLINE ) 100 MG tablet Take 100 mg by mouth 3 (three) times daily.     insulin  aspart protamine - aspart (NOVOLOG  MIX 70/30 FLEXPEN) (70-30) 100 UNIT/ML FlexPen 30 units 15 min before b'fast and 26 units 15 min before dinner 45 mL 0   Insulin  Pen Needle (BD PEN NEEDLE NANO 2ND GEN) 32G X 4 MM MISC 1 EACH BY DOES NOT APPLY ROUTE IN THE MORNING AND AT BEDTIME. USE WITH INSULIN  PEN 2 TIMES A DAY.DUE FOR APPOINTMENT 200 each 5   Insulin  Syringe-Needle U-100 (B-D INSULIN  SYRINGE) 31G X 5/16 0.3  ML MISC Use 2-3x a day 200 each 3   lactulose  (CHRONULAC ) 10 GM/15ML solution Take 15-30 cc daily as needed for bowels (Patient taking differently: Take 10-20 g by mouth daily as needed for moderate constipation.) 236 mL 11   latanoprost  (XALATAN ) 0.005 % ophthalmic solution Place 1 drop into both eyes at bedtime.     levothyroxine  (SYNTHROID ) 88 MCG tablet Take 1 tablet (88 mcg total) by mouth daily. 90 tablet 1   metoprolol  tartrate (LOPRESSOR ) 100 MG tablet Take 100 mg by mouth 2 (two) times daily.     montelukast (SINGULAIR) 10 MG tablet Take 10 mg by mouth daily.     Multiple Vitamins-Minerals (CENTRUM SILVER ADULT 50+) TABS Take 1 tablet by mouth daily.     olmesartan (BENICAR) 40 MG tablet Take 40 mg by mouth daily.     pravastatin  (PRAVACHOL ) 80 MG tablet Take 80 mg by mouth daily.  5   Propylene Glycol 0.6 % SOLN Place 1 drop into both eyes daily as needed (swelling or redness).     traMADol  (ULTRAM ) 50 MG tablet Take 1-2 tablets (50-100 mg total) by mouth every 6 (six) hours as needed for moderate pain. 15 tablet 0   No current facility-administered medications on file prior to visit.   Allergies  Allergen Reactions   Ace Inhibitors Cough   Family History  Problem Relation Age of Onset   Stroke Mother    Cancer Father        pts states cancer in back   Lung cancer Sister    Heart disease Sister    Colon cancer Neg Hx    PE: There were no vitals taken for this visit. Wt Readings from Last 3 Encounters:  02/25/24 187 lb (84.8 kg)  10/28/23 190 lb 3.2 oz (86.3 kg)  05/23/23 188 lb 6.4 oz (85.5 kg)   Constitutional: overweight, in NAD Eyes:  EOMI, no exophthalmos ENT: no neck masses, no cervical lymphadenopathy Cardiovascular: RRR, No MRG Respiratory: CTA B Musculoskeletal: no deformities Skin:no rashes Neurological: no tremor with outstretched hands  ASSESSMENT: 1. DM2, insulin -dependent, uncontrolled,  with complications - CKD stage 3  2. Primary  HPTH  3.  Vitamin D  deficiency  4.  Follicular variant of papillary thyroid  cancer  5.  Postsurgical hypothyroidism  PLAN:  1. Patient with uncontrolled type 2 diabetes, on a premixed insulin  regimen, with worsening control at last visit.  HbA1c at that time was 8.4%, increased from 7.9%.  She had relaxed her diet and was not eating consistent meals after the death of her son, before last visit, and she was drinking juice, eating popcorn and frequently drinking Glucerna.  We discussed about how to improve meals and have regular meals.  We increased her morning insulin  dose and moved her evening insulin  dose from bedtime to before dinner.  We also had to stop her metformin , which she was apparently still taking, due to the low GFR. CGM interpretation: -At today's visit, we reviewed her CGM downloads: It appears that *** of values are in target range (goal >70%), while *** are higher than 180 (goal <25%), and *** are lower than 70 (goal <4%).  The calculated average blood sugar is ***.  The projected HbA1c for the next 3 months (GMI) is ***. -Reviewing the CGM trends, ***  - I suggested to:  Patient Instructions  Please continue Levothyroxine  88 mcg daily.  Take the thyroid  hormone every day, with water, at least 30 minutes before breakfast, separated by at least 4 hours from: - acid reflux medications - calcium  - iron  - multivitamins  Please take Novolog  70/30 15 min before the 2 meals: - 30 units 15 min before b'fast - 26 units 15 min before dinner  Try to reduce popcorn.  NO JUICE, REGULAR SODA OR SWEET TEA!  Please come back for a follow-up appointment in 3-4 months.  - we checked her HbA1c: 7%  - advised to check sugars at different times of the day - 4x a day, rotating check times - advised for yearly eye exams >> she is UTD - return to clinic in 3-4 months  2. Primary  HPTH and 3. vitamin D  deficiency -s/p left superior parathyroidectomy with resection of hypercellular parathyroid   focus - He felt much better after the surgery -However, unfortunately, calcium  has fluctuated but lately has been elevated, 11, associated with a nonsuppressed parathyroid  hormone, 67,  per records from nephrology 07/2023 - Vitamin D  level was previously low and I advised her to start 2000 units of vitamin D  but at last visit she was still off it.  The vitamin D  level was still low, I again recommended to start 2000 units daily.  However, she had another vitamin D  level that was even lower, at 18.4 at last check by PCP in 10/2023.  She mentions she did start a vitamin D  gummy (? Dose) since then. Corrected calcium  obtained at that time was 10.32, with the upper limit of the normal range at 10.3, so with the very slight hypercalcemia - At last visit we discussed about a referral back to Dr. Eletha but she absolutely refused this.  We did discuss about the risks of persistent hypercalcemia including kidney stones and osteoporosis, but she wanted to forego any intervention. - Will continue to manage her expectantly for now  4.  Follicular variant of papillary thyroid  cancer -She has a history of multinodular goiter, with previous benign biopsies.  However, she had total thyroidectomy in 02/2021 and a 1.5 cm focus of follicular variant of PTC was found -We did not proceed with RAI ablation since her thyroid  cancer  focus was small and without high risk features -At last check, her thyroglobulin level was slightly higher, but still very low, at 0.4.  ATA antibodies are undetectable.  In the absence of RAI treatment, a certain amount of variability in thyroglobulin level is expected -Latest thyroglobulin improved to 0.2, while ATA's were undetectable -we checked a neck ultrasound (05/31/2023) and this showed a small hypoechoic soft tissue nodule in the inferior right thyroid  resection bed consistent with a small amount of residual thyroid  tissue or reactive lymph node.  This had a distinct vascular pedicle.   Plan to repeat another ultrasound now.  I wonder if this nodule is  actually a parathyroid  adenoma.  5.  Postsurgical hypothyroidism - latest thyroid  labs reviewed with pt. >> normal: Lab Results  Component Value Date   TSH 0.80 07/17/2023  - she continues on LT4 88 mcg daily - pt feels good on this dose. - we discussed about taking the thyroid  hormone every day, with water, >30 minutes before breakfast, separated by >4 hours from acid reflux medications, calcium , iron , multivitamins. Pt. is taking it correctly. - will check thyroid  tests today: TSH and fT4 - If labs are abnormal, she will need to return for repeat TFTs in 1.5 months  Lela Fendt, MD PhD [facility]
